# Patient Record
Sex: Male | Born: 1940 | Race: White | Hispanic: No | Marital: Married | Smoking: Former smoker
Health system: Southern US, Community
[De-identification: ages and names within clinical notes are randomized; demographics above are authoritative.]

## PROBLEM LIST (undated history)

## (undated) DIAGNOSIS — C499 Malignant neoplasm of connective and soft tissue, unspecified: Secondary | ICD-10-CM

## (undated) DIAGNOSIS — C801 Malignant (primary) neoplasm, unspecified: Secondary | ICD-10-CM

## (undated) DIAGNOSIS — C3490 Malignant neoplasm of unspecified part of unspecified bronchus or lung: Secondary | ICD-10-CM

## (undated) DIAGNOSIS — Z87442 Personal history of urinary calculi: Secondary | ICD-10-CM

## (undated) DIAGNOSIS — I219 Acute myocardial infarction, unspecified: Secondary | ICD-10-CM

## (undated) DIAGNOSIS — I639 Cerebral infarction, unspecified: Secondary | ICD-10-CM

## (undated) DIAGNOSIS — I2510 Atherosclerotic heart disease of native coronary artery without angina pectoris: Secondary | ICD-10-CM

## (undated) DIAGNOSIS — IMO0001 Reserved for inherently not codable concepts without codable children: Secondary | ICD-10-CM

## (undated) HISTORY — DX: Malignant neoplasm of unspecified part of unspecified bronchus or lung: C34.90

## (undated) HISTORY — PX: SKIN CANCER EXCISION: SHX779

## (undated) HISTORY — PX: THORACOTOMY: SUR1349

## (undated) HISTORY — PX: CAROTID ENDARTERECTOMY: SUR193

---

## 1998-12-23 DIAGNOSIS — I219 Acute myocardial infarction, unspecified: Secondary | ICD-10-CM

## 1998-12-23 HISTORY — DX: Acute myocardial infarction, unspecified: I21.9

## 2003-12-24 DIAGNOSIS — I639 Cerebral infarction, unspecified: Secondary | ICD-10-CM

## 2003-12-24 HISTORY — DX: Cerebral infarction, unspecified: I63.9

## 2004-01-23 ENCOUNTER — Other Ambulatory Visit: Payer: Self-pay

## 2004-01-24 ENCOUNTER — Other Ambulatory Visit: Payer: Self-pay

## 2004-01-25 ENCOUNTER — Other Ambulatory Visit: Payer: Self-pay

## 2004-10-30 ENCOUNTER — Other Ambulatory Visit: Payer: Self-pay

## 2004-10-30 ENCOUNTER — Inpatient Hospital Stay: Payer: Self-pay | Admitting: Surgery

## 2004-11-05 ENCOUNTER — Encounter: Payer: Self-pay | Admitting: Internal Medicine

## 2005-01-31 ENCOUNTER — Emergency Department: Payer: Self-pay | Admitting: Emergency Medicine

## 2005-10-14 ENCOUNTER — Other Ambulatory Visit: Payer: Self-pay

## 2005-10-15 ENCOUNTER — Observation Stay: Payer: Self-pay | Admitting: Internal Medicine

## 2005-11-20 ENCOUNTER — Ambulatory Visit: Payer: Self-pay | Admitting: Surgery

## 2005-11-29 ENCOUNTER — Ambulatory Visit: Payer: Self-pay | Admitting: Surgery

## 2005-12-23 DIAGNOSIS — C801 Malignant (primary) neoplasm, unspecified: Secondary | ICD-10-CM

## 2005-12-23 HISTORY — DX: Malignant (primary) neoplasm, unspecified: C80.1

## 2006-04-09 ENCOUNTER — Other Ambulatory Visit: Payer: Self-pay

## 2006-04-16 ENCOUNTER — Inpatient Hospital Stay: Payer: Self-pay | Admitting: Surgery

## 2007-05-07 ENCOUNTER — Ambulatory Visit: Payer: Self-pay | Admitting: Surgery

## 2007-05-13 ENCOUNTER — Ambulatory Visit: Payer: Self-pay | Admitting: Surgery

## 2007-05-27 ENCOUNTER — Ambulatory Visit: Payer: Self-pay | Admitting: Surgery

## 2007-06-05 ENCOUNTER — Ambulatory Visit: Payer: Self-pay | Admitting: Surgery

## 2007-06-16 ENCOUNTER — Ambulatory Visit: Payer: Self-pay | Admitting: Oncology

## 2007-06-23 ENCOUNTER — Ambulatory Visit: Payer: Self-pay | Admitting: Oncology

## 2007-07-01 ENCOUNTER — Ambulatory Visit: Payer: Self-pay | Admitting: Surgery

## 2007-07-08 ENCOUNTER — Inpatient Hospital Stay: Payer: Self-pay | Admitting: Surgery

## 2007-07-21 ENCOUNTER — Ambulatory Visit: Payer: Self-pay | Admitting: Surgery

## 2007-07-24 ENCOUNTER — Ambulatory Visit: Payer: Self-pay | Admitting: Oncology

## 2007-08-19 ENCOUNTER — Ambulatory Visit: Payer: Self-pay | Admitting: Oncology

## 2007-08-24 ENCOUNTER — Ambulatory Visit: Payer: Self-pay | Admitting: Oncology

## 2007-11-23 ENCOUNTER — Ambulatory Visit: Payer: Self-pay | Admitting: Oncology

## 2007-12-09 ENCOUNTER — Ambulatory Visit: Payer: Self-pay | Admitting: Oncology

## 2007-12-24 ENCOUNTER — Ambulatory Visit: Payer: Self-pay | Admitting: Oncology

## 2008-06-15 ENCOUNTER — Ambulatory Visit: Payer: Self-pay | Admitting: Oncology

## 2008-06-17 ENCOUNTER — Ambulatory Visit: Payer: Self-pay | Admitting: Oncology

## 2008-06-22 ENCOUNTER — Ambulatory Visit: Payer: Self-pay | Admitting: Oncology

## 2008-07-01 ENCOUNTER — Ambulatory Visit: Payer: Self-pay | Admitting: Vascular Surgery

## 2008-07-20 ENCOUNTER — Inpatient Hospital Stay: Payer: Self-pay | Admitting: Vascular Surgery

## 2008-08-23 ENCOUNTER — Ambulatory Visit: Payer: Self-pay | Admitting: Oncology

## 2008-11-22 ENCOUNTER — Ambulatory Visit: Payer: Self-pay | Admitting: Oncology

## 2008-12-14 ENCOUNTER — Ambulatory Visit: Payer: Self-pay | Admitting: Oncology

## 2008-12-23 ENCOUNTER — Ambulatory Visit: Payer: Self-pay | Admitting: Oncology

## 2008-12-23 HISTORY — PX: EYE SURGERY: SHX253

## 2009-06-20 ENCOUNTER — Ambulatory Visit: Payer: Self-pay | Admitting: Oncology

## 2009-06-22 ENCOUNTER — Ambulatory Visit: Payer: Self-pay | Admitting: Oncology

## 2009-07-23 ENCOUNTER — Ambulatory Visit: Payer: Self-pay | Admitting: Oncology

## 2009-12-23 ENCOUNTER — Ambulatory Visit: Payer: Self-pay | Admitting: Oncology

## 2010-01-19 ENCOUNTER — Ambulatory Visit: Payer: Self-pay | Admitting: Oncology

## 2010-01-23 ENCOUNTER — Ambulatory Visit: Payer: Self-pay | Admitting: Oncology

## 2010-02-21 ENCOUNTER — Ambulatory Visit: Payer: Self-pay | Admitting: Ophthalmology

## 2010-06-22 ENCOUNTER — Ambulatory Visit: Payer: Self-pay | Admitting: Oncology

## 2010-07-16 ENCOUNTER — Ambulatory Visit: Payer: Self-pay | Admitting: Oncology

## 2010-07-19 ENCOUNTER — Ambulatory Visit: Payer: Self-pay | Admitting: Oncology

## 2010-07-23 ENCOUNTER — Ambulatory Visit: Payer: Self-pay | Admitting: Oncology

## 2010-12-03 ENCOUNTER — Ambulatory Visit: Payer: Self-pay | Admitting: Internal Medicine

## 2011-01-03 ENCOUNTER — Ambulatory Visit: Payer: Self-pay | Admitting: Vascular Surgery

## 2011-01-04 ENCOUNTER — Ambulatory Visit: Payer: Self-pay | Admitting: Vascular Surgery

## 2011-01-16 ENCOUNTER — Ambulatory Visit: Payer: Self-pay | Admitting: Oncology

## 2011-03-01 ENCOUNTER — Ambulatory Visit: Payer: Self-pay | Admitting: Oncology

## 2011-03-24 ENCOUNTER — Ambulatory Visit: Payer: Self-pay | Admitting: Oncology

## 2011-08-02 ENCOUNTER — Ambulatory Visit: Payer: Self-pay | Admitting: Oncology

## 2011-08-24 ENCOUNTER — Ambulatory Visit: Payer: Self-pay | Admitting: Oncology

## 2011-11-11 ENCOUNTER — Ambulatory Visit: Payer: Self-pay | Admitting: Oncology

## 2011-11-23 ENCOUNTER — Ambulatory Visit: Payer: Self-pay | Admitting: Oncology

## 2011-12-24 DIAGNOSIS — C499 Malignant neoplasm of connective and soft tissue, unspecified: Secondary | ICD-10-CM

## 2011-12-24 HISTORY — PX: COLONOSCOPY: SHX174

## 2011-12-24 HISTORY — DX: Malignant neoplasm of connective and soft tissue, unspecified: C49.9

## 2012-01-30 ENCOUNTER — Ambulatory Visit: Payer: Self-pay | Admitting: Oncology

## 2012-01-31 ENCOUNTER — Ambulatory Visit: Payer: Self-pay | Admitting: Oncology

## 2012-01-31 LAB — CREATININE, SERUM
Creatinine: 0.93 mg/dL (ref 0.60–1.30)
EGFR (African American): >60
EGFR (Non-African Amer.): >60

## 2012-02-03 ENCOUNTER — Ambulatory Visit: Payer: Self-pay | Admitting: Oncology

## 2012-02-03 LAB — COMPREHENSIVE METABOLIC PANEL
Albumin: 3.8 g/dL (ref 3.4–5.0)
Alkaline Phosphatase: 94 U/L (ref 50–136)
BUN: 17 mg/dL (ref 7–18)
Bilirubin,Total: 0.5 mg/dL (ref 0.2–1.0)
Creatinine: 1.02 mg/dL (ref 0.60–1.30)
EGFR (Non-African Amer.): >60
Osmolality: 280 (ref 275–301)
Potassium: 4.5 mmol/L (ref 3.5–5.1)
SGOT(AST): 22 U/L (ref 15–37)
SGPT (ALT): 26 U/L
Total Protein: 7.5 g/dL (ref 6.4–8.2)

## 2012-02-03 LAB — CBC CANCER CENTER
Basophil %: 0.9 %
Eosinophil #: 0.1 x10 3/mm (ref 0.0–0.7)
MCH: 29.8 pg (ref 26.0–34.0)
Monocyte #: 0.3 x10 3/mm (ref 0.0–0.7)
Monocyte %: 7.2 %
Neutrophil #: 2.6 x10 3/mm (ref 1.4–6.5)
Neutrophil %: 58.7 %
RBC: 5.26 10*6/uL (ref 4.40–5.90)
WBC: 4.4 x10 3/mm (ref 3.8–10.6)

## 2012-02-21 ENCOUNTER — Ambulatory Visit: Payer: Self-pay | Admitting: Oncology

## 2012-05-04 ENCOUNTER — Ambulatory Visit: Payer: Self-pay | Admitting: Oncology

## 2012-05-04 LAB — COMPREHENSIVE METABOLIC PANEL
Albumin: 3.7 g/dL (ref 3.4–5.0)
Alkaline Phosphatase: 85 U/L (ref 50–136)
Anion Gap: 9 (ref 7–16)
Bilirubin,Total: 0.5 mg/dL (ref 0.2–1.0)
Calcium, Total: 9 mg/dL (ref 8.5–10.1)
Chloride: 101 mmol/L (ref 98–107)
EGFR (African American): >60
EGFR (Non-African Amer.): >60
Glucose: 101 mg/dL — ABNORMAL HIGH (ref 65–99)
Osmolality: 279 (ref 275–301)
SGOT(AST): 23 U/L (ref 15–37)
Sodium: 138 mmol/L (ref 136–145)

## 2012-05-04 LAB — CBC CANCER CENTER
Basophil #: 0 x10 3/mm (ref 0.0–0.1)
Basophil %: 1 %
Eosinophil #: 0.1 x10 3/mm (ref 0.0–0.7)
Eosinophil %: 2.3 %
Lymphocyte #: 1.4 x10 3/mm (ref 1.0–3.6)
MCH: 29 pg (ref 26.0–34.0)
MCHC: 32.5 g/dL (ref 32.0–36.0)
MCV: 89 fL (ref 80–100)
Monocyte #: 0.4 x10 3/mm (ref 0.2–1.0)
Neutrophil #: 3 x10 3/mm (ref 1.4–6.5)
Neutrophil %: 60.6 %
Platelet: 203 x10 3/mm (ref 150–440)
RDW: 13.3 % (ref 11.5–14.5)
WBC: 4.9 x10 3/mm (ref 3.8–10.6)

## 2012-05-23 ENCOUNTER — Ambulatory Visit: Payer: Self-pay | Admitting: Oncology

## 2012-07-29 ENCOUNTER — Ambulatory Visit: Payer: Self-pay | Admitting: Vascular Surgery

## 2012-07-29 LAB — CREATININE, SERUM
Creatinine: 0.85 mg/dL (ref 0.60–1.30)
EGFR (Non-African Amer.): >60

## 2012-09-02 ENCOUNTER — Ambulatory Visit: Payer: Self-pay | Admitting: Vascular Surgery

## 2012-09-02 LAB — BASIC METABOLIC PANEL
BUN: 14 mg/dL (ref 7–18)
Creatinine: 0.63 mg/dL (ref 0.60–1.30)
EGFR (African American): >60
EGFR (Non-African Amer.): >60
Glucose: 98 mg/dL (ref 65–99)
Osmolality: 276 (ref 275–301)
Potassium: 4 mmol/L (ref 3.5–5.1)

## 2012-09-02 LAB — CBC
HCT: 46.5 % (ref 40.0–52.0)
MCH: 29.7 pg (ref 26.0–34.0)
MCHC: 33.9 g/dL (ref 32.0–36.0)
MCV: 88 fL (ref 80–100)
Platelet: 205 10*3/uL (ref 150–440)
RDW: 13.7 % (ref 11.5–14.5)
WBC: 5.3 10*3/uL (ref 3.8–10.6)

## 2012-09-09 ENCOUNTER — Observation Stay: Payer: Self-pay | Admitting: Vascular Surgery

## 2012-10-22 ENCOUNTER — Ambulatory Visit: Payer: Self-pay | Admitting: Vascular Surgery

## 2012-10-22 LAB — BASIC METABOLIC PANEL
Anion Gap: 10 (ref 7–16)
BUN: 15 mg/dL (ref 7–18)
Calcium, Total: 9.1 mg/dL (ref 8.5–10.1)
EGFR (African American): >60
EGFR (Non-African Amer.): >60
Glucose: 107 mg/dL — ABNORMAL HIGH (ref 65–99)
Osmolality: 275 (ref 275–301)
Potassium: 4.1 mmol/L (ref 3.5–5.1)
Sodium: 137 mmol/L (ref 136–145)

## 2012-10-22 LAB — CBC
HCT: 46.8 % (ref 40.0–52.0)
HGB: 15.9 g/dL (ref 13.0–18.0)
Platelet: 209 10*3/uL (ref 150–440)
RDW: 13.4 % (ref 11.5–14.5)
WBC: 4.8 10*3/uL (ref 3.8–10.6)

## 2012-10-28 ENCOUNTER — Inpatient Hospital Stay: Payer: Self-pay | Admitting: Vascular Surgery

## 2012-10-28 HISTORY — PX: ABDOMINAL AORTIC ANEURYSM REPAIR: SHX42

## 2012-10-29 LAB — CBC WITH DIFFERENTIAL/PLATELET
Basophil #: 0 10*3/uL (ref 0.0–0.1)
Eosinophil #: 0 10*3/uL (ref 0.0–0.7)
HCT: 42.2 % (ref 40.0–52.0)
Lymphocyte %: 11.2 %
MCHC: 34.3 g/dL (ref 32.0–36.0)
MCV: 89 fL (ref 80–100)
Monocyte %: 7.8 %
Neutrophil #: 6.5 10*3/uL (ref 1.4–6.5)
Neutrophil %: 80.8 %
Platelet: 190 10*3/uL (ref 150–440)
RDW: 13 % (ref 11.5–14.5)

## 2012-10-29 LAB — COMPREHENSIVE METABOLIC PANEL WITH GFR
Albumin: 3.2 g/dL — ABNORMAL LOW
Alkaline Phosphatase: 69 U/L
Anion Gap: 11
BUN: 12 mg/dL
Bilirubin,Total: 0.5 mg/dL
Calcium, Total: 8.7 mg/dL
Chloride: 106 mmol/L
Co2: 24 mmol/L
Creatinine: 0.8 mg/dL
EGFR (African American): >60
EGFR (Non-African Amer.): >60
Glucose: 125 mg/dL — ABNORMAL HIGH
Osmolality: 282
Potassium: 4.4 mmol/L
SGOT(AST): 23 U/L
SGPT (ALT): 19 U/L
Sodium: 141 mmol/L
Total Protein: 6.5 g/dL

## 2012-10-29 LAB — MAGNESIUM: Magnesium: 1.9 mg/dL

## 2012-10-29 LAB — PROTIME-INR
INR: 0.9
Prothrombin Time: 12.8 s

## 2012-10-29 LAB — PHOSPHORUS: Phosphorus: 3 mg/dL

## 2012-11-04 ENCOUNTER — Ambulatory Visit: Payer: Self-pay | Admitting: Oncology

## 2012-11-04 LAB — COMPREHENSIVE METABOLIC PANEL
Albumin: 3.1 g/dL — ABNORMAL LOW (ref 3.4–5.0)
Alkaline Phosphatase: 85 U/L (ref 50–136)
Anion Gap: 12 (ref 7–16)
Bilirubin,Total: 0.6 mg/dL (ref 0.2–1.0)
Calcium, Total: 9.3 mg/dL (ref 8.5–10.1)
Creatinine: 0.93 mg/dL (ref 0.60–1.30)
EGFR (African American): >60
EGFR (Non-African Amer.): >60
Glucose: 106 mg/dL — ABNORMAL HIGH (ref 65–99)
Osmolality: 284 (ref 275–301)
Potassium: 4.7 mmol/L (ref 3.5–5.1)
SGPT (ALT): 25 U/L (ref 12–78)
Sodium: 141 mmol/L (ref 136–145)
Total Protein: 7.4 g/dL (ref 6.4–8.2)

## 2012-11-04 LAB — CBC CANCER CENTER
Eosinophil %: 4.1 %
Lymphocyte #: 1.3 x10 3/mm (ref 1.0–3.6)
Lymphocyte %: 24.8 %
MCV: 91 fL (ref 80–100)
Monocyte %: 9.8 %
Neutrophil %: 60.6 %
Platelet: 222 x10 3/mm (ref 150–440)
RBC: 4.85 10*6/uL (ref 4.40–5.90)
WBC: 5.3 x10 3/mm (ref 3.8–10.6)

## 2012-11-22 ENCOUNTER — Ambulatory Visit: Payer: Self-pay | Admitting: Oncology

## 2013-01-23 ENCOUNTER — Ambulatory Visit: Payer: Self-pay | Admitting: Oncology

## 2013-01-29 ENCOUNTER — Ambulatory Visit: Payer: Self-pay | Admitting: Oncology

## 2013-02-01 ENCOUNTER — Ambulatory Visit: Payer: Self-pay | Admitting: Oncology

## 2013-02-01 LAB — CREATININE, SERUM
Creatinine: 0.91 mg/dL (ref 0.60–1.30)
EGFR (African American): >60
EGFR (Non-African Amer.): >60

## 2013-02-17 ENCOUNTER — Ambulatory Visit: Payer: Self-pay | Admitting: Family Medicine

## 2013-02-20 ENCOUNTER — Ambulatory Visit: Payer: Self-pay | Admitting: Oncology

## 2013-03-06 ENCOUNTER — Emergency Department: Payer: Self-pay | Admitting: Unknown Physician Specialty

## 2013-03-11 ENCOUNTER — Other Ambulatory Visit: Payer: Self-pay | Admitting: Vascular Surgery

## 2013-03-11 LAB — LIPID PANEL
Cholesterol: 228 mg/dL — ABNORMAL HIGH (ref 0–200)
HDL Cholesterol: 42 mg/dL (ref 40–60)
VLDL Cholesterol, Calc: 31 mg/dL (ref 5–40)

## 2013-03-11 LAB — CREATININE, SERUM
Creatinine: 0.84 mg/dL (ref 0.60–1.30)
EGFR (African American): >60

## 2013-03-11 LAB — HEPATIC FUNCTION PANEL A (ARMC)
SGOT(AST): 31 U/L (ref 15–37)
SGPT (ALT): 24 U/L (ref 12–78)

## 2013-03-18 ENCOUNTER — Ambulatory Visit: Payer: Self-pay | Admitting: Oncology

## 2013-03-18 LAB — PROTIME-INR: Prothrombin Time: 13 secs (ref 11.5–14.7)

## 2013-03-18 LAB — APTT: Activated PTT: 31.3 secs (ref 23.6–35.9)

## 2013-03-18 LAB — PLATELET COUNT: Platelet: 206 10*3/uL (ref 150–440)

## 2013-03-22 ENCOUNTER — Emergency Department: Payer: Self-pay | Admitting: Emergency Medicine

## 2013-03-23 ENCOUNTER — Ambulatory Visit: Payer: Self-pay | Admitting: Oncology

## 2013-03-23 LAB — PATHOLOGY REPORT

## 2013-04-01 LAB — COMPREHENSIVE METABOLIC PANEL
Albumin: 3.9 g/dL (ref 3.4–5.0)
Anion Gap: 8 (ref 7–16)
Bilirubin,Total: 0.6 mg/dL (ref 0.2–1.0)
Co2: 29 mmol/L (ref 21–32)
Creatinine: 1.07 mg/dL (ref 0.60–1.30)
EGFR (African American): >60
EGFR (Non-African Amer.): >60
Osmolality: 276 (ref 275–301)
Potassium: 4.4 mmol/L (ref 3.5–5.1)
Sodium: 138 mmol/L (ref 136–145)
Total Protein: 7.9 g/dL (ref 6.4–8.2)

## 2013-04-01 LAB — PROTIME-INR
INR: 0.9
Prothrombin Time: 12.7 secs (ref 11.5–14.7)

## 2013-04-01 LAB — CBC CANCER CENTER
Basophil #: 0.1 x10 3/mm (ref 0.0–0.1)
Basophil %: 1.2 %
Eosinophil #: 0.1 x10 3/mm (ref 0.0–0.7)
Eosinophil %: 2.6 %
HCT: 48.5 % (ref 40.0–52.0)
MCH: 28.9 pg (ref 26.0–34.0)
MCV: 87 fL (ref 80–100)
Monocyte #: 0.4 x10 3/mm (ref 0.2–1.0)
Neutrophil #: 2.9 x10 3/mm (ref 1.4–6.5)
Platelet: 192 x10 3/mm (ref 150–440)
RBC: 5.58 10*6/uL (ref 4.40–5.90)

## 2013-04-01 LAB — APTT: Activated PTT: 30.4 secs (ref 23.6–35.9)

## 2013-04-05 ENCOUNTER — Ambulatory Visit: Payer: Self-pay

## 2013-04-12 ENCOUNTER — Inpatient Hospital Stay: Payer: Self-pay | Admitting: Cardiothoracic Surgery

## 2013-04-13 LAB — CBC WITH DIFFERENTIAL/PLATELET
Basophil #: 0 10*3/uL (ref 0.0–0.1)
Eosinophil #: 0 10*3/uL (ref 0.0–0.7)
Eosinophil %: 0.4 %
HCT: 44.5 % (ref 40.0–52.0)
Lymphocyte #: 0.8 10*3/uL — ABNORMAL LOW (ref 1.0–3.6)
MCH: 29.3 pg (ref 26.0–34.0)
MCV: 89 fL (ref 80–100)
Monocyte #: 0.7 x10 3/mm (ref 0.2–1.0)
Neutrophil #: 6.5 10*3/uL (ref 1.4–6.5)
Platelet: 177 10*3/uL (ref 150–440)
RBC: 5.03 10*6/uL (ref 4.40–5.90)
RDW: 13.5 % (ref 11.5–14.5)
WBC: 8 10*3/uL (ref 3.8–10.6)

## 2013-04-13 LAB — BASIC METABOLIC PANEL
Anion Gap: 7 (ref 7–16)
BUN: 15 mg/dL (ref 7–18)
Calcium, Total: 8.2 mg/dL — ABNORMAL LOW (ref 8.5–10.1)
Chloride: 104 mmol/L (ref 98–107)
EGFR (African American): >60
Glucose: 167 mg/dL — ABNORMAL HIGH (ref 65–99)

## 2013-04-14 LAB — PATHOLOGY REPORT

## 2013-04-17 LAB — PLATELET COUNT: Platelet: 203 10*3/uL (ref 150–440)

## 2013-04-17 LAB — HEMOGLOBIN: HGB: 14 g/dL (ref 13.0–18.0)

## 2013-04-22 ENCOUNTER — Ambulatory Visit: Payer: Self-pay | Admitting: Oncology

## 2013-05-06 ENCOUNTER — Ambulatory Visit: Payer: Self-pay | Admitting: Cardiothoracic Surgery

## 2013-05-23 ENCOUNTER — Ambulatory Visit: Payer: Self-pay | Admitting: Oncology

## 2013-07-23 ENCOUNTER — Ambulatory Visit: Payer: Self-pay | Admitting: Oncology

## 2013-07-29 ENCOUNTER — Ambulatory Visit: Payer: Self-pay | Admitting: Oncology

## 2013-08-23 ENCOUNTER — Ambulatory Visit: Payer: Self-pay | Admitting: Oncology

## 2013-12-01 ENCOUNTER — Ambulatory Visit: Payer: Self-pay | Admitting: Oncology

## 2013-12-06 ENCOUNTER — Ambulatory Visit: Payer: Self-pay | Admitting: Oncology

## 2013-12-06 LAB — COMPREHENSIVE METABOLIC PANEL
Albumin: 3.7 g/dL (ref 3.4–5.0)
Alkaline Phosphatase: 87 U/L
Anion Gap: 8 (ref 7–16)
BUN: 17 mg/dL (ref 7–18)
Calcium, Total: 9.1 mg/dL (ref 8.5–10.1)
Chloride: 102 mmol/L (ref 98–107)
Co2: 29 mmol/L (ref 21–32)
EGFR (African American): >60
Glucose: 97 mg/dL (ref 65–99)
Potassium: 4.6 mmol/L (ref 3.5–5.1)
SGOT(AST): 24 U/L (ref 15–37)
SGPT (ALT): 22 U/L (ref 12–78)
Sodium: 139 mmol/L (ref 136–145)

## 2013-12-06 LAB — CBC CANCER CENTER
Basophil #: 0.1 x10 3/mm (ref 0.0–0.1)
Basophil %: 1.1 %
Eosinophil #: 0.1 x10 3/mm (ref 0.0–0.7)
Eosinophil %: 2.1 %
Lymphocyte %: 31.3 %
MCH: 29.5 pg (ref 26.0–34.0)
MCHC: 33.1 g/dL (ref 32.0–36.0)
Monocyte #: 0.4 x10 3/mm (ref 0.2–1.0)
Monocyte %: 8.1 %
Neutrophil %: 57.4 %
Platelet: 212 x10 3/mm (ref 150–440)
RBC: 5.37 10*6/uL (ref 4.40–5.90)

## 2013-12-07 ENCOUNTER — Ambulatory Visit: Payer: Self-pay | Admitting: Oncology

## 2013-12-14 ENCOUNTER — Ambulatory Visit: Payer: Self-pay | Admitting: Oncology

## 2013-12-15 LAB — PATHOLOGY REPORT

## 2013-12-23 ENCOUNTER — Ambulatory Visit: Payer: Self-pay | Admitting: Oncology

## 2013-12-23 ENCOUNTER — Ambulatory Visit: Payer: Self-pay | Admitting: Cardiothoracic Surgery

## 2014-01-06 LAB — CBC CANCER CENTER
BASOS ABS: 0.1 x10 3/mm (ref 0.0–0.1)
BASOS PCT: 1.1 %
EOS PCT: 2.8 %
Eosinophil #: 0.2 x10 3/mm (ref 0.0–0.7)
HCT: 47.4 % (ref 40.0–52.0)
HGB: 15.6 g/dL (ref 13.0–18.0)
LYMPHS ABS: 1.5 x10 3/mm (ref 1.0–3.6)
LYMPHS PCT: 27.3 %
MCH: 29.1 pg (ref 26.0–34.0)
MCHC: 32.9 g/dL (ref 32.0–36.0)
MCV: 89 fL (ref 80–100)
Monocyte #: 0.4 x10 3/mm (ref 0.2–1.0)
Monocyte %: 7 %
NEUTROS ABS: 3.5 x10 3/mm (ref 1.4–6.5)
NEUTROS PCT: 61.8 %
PLATELETS: 203 x10 3/mm (ref 150–440)
RBC: 5.36 10*6/uL (ref 4.40–5.90)
RDW: 13.2 % (ref 11.5–14.5)
WBC: 5.6 x10 3/mm (ref 3.8–10.6)

## 2014-01-06 LAB — COMPREHENSIVE METABOLIC PANEL
AST: 21 U/L (ref 15–37)
Albumin: 3.6 g/dL (ref 3.4–5.0)
Alkaline Phosphatase: 80 U/L
Anion Gap: 6 — ABNORMAL LOW (ref 7–16)
BUN: 20 mg/dL — ABNORMAL HIGH (ref 7–18)
Bilirubin,Total: 0.4 mg/dL (ref 0.2–1.0)
CO2: 31 mmol/L (ref 21–32)
Calcium, Total: 8.8 mg/dL (ref 8.5–10.1)
Chloride: 101 mmol/L (ref 98–107)
Creatinine: 0.9 mg/dL (ref 0.60–1.30)
GLUCOSE: 99 mg/dL (ref 65–99)
Osmolality: 278 (ref 275–301)
POTASSIUM: 4.9 mmol/L (ref 3.5–5.1)
SGPT (ALT): 20 U/L (ref 12–78)
Sodium: 138 mmol/L (ref 136–145)
Total Protein: 7.5 g/dL (ref 6.4–8.2)

## 2014-01-06 LAB — PROTIME-INR
INR: 0.9
Prothrombin Time: 12.2 s

## 2014-01-06 LAB — APTT: Activated PTT: 27.1 s

## 2014-01-12 ENCOUNTER — Ambulatory Visit: Payer: Self-pay

## 2014-01-19 ENCOUNTER — Inpatient Hospital Stay: Payer: Self-pay

## 2014-01-20 LAB — BASIC METABOLIC PANEL
Anion Gap: 4 — ABNORMAL LOW (ref 7–16)
BUN: 26 mg/dL — ABNORMAL HIGH (ref 7–18)
CALCIUM: 8.3 mg/dL — AB (ref 8.5–10.1)
CHLORIDE: 103 mmol/L (ref 98–107)
CREATININE: 1.47 mg/dL — AB (ref 0.60–1.30)
Co2: 27 mmol/L (ref 21–32)
GFR CALC AF AMER: 54 — AB
GFR CALC NON AF AMER: 47 — AB
GLUCOSE: 119 mg/dL — AB (ref 65–99)
OSMOLALITY: 274 (ref 275–301)
POTASSIUM: 4.4 mmol/L (ref 3.5–5.1)
SODIUM: 134 mmol/L — AB (ref 136–145)

## 2014-01-20 LAB — CBC WITH DIFFERENTIAL/PLATELET
BASOS PCT: 0.7 %
Basophil #: 0 10*3/uL (ref 0.0–0.1)
EOS ABS: 0.1 10*3/uL (ref 0.0–0.7)
Eosinophil %: 1.8 %
HCT: 40.1 % (ref 40.0–52.0)
HGB: 13.5 g/dL (ref 13.0–18.0)
LYMPHS ABS: 1 10*3/uL (ref 1.0–3.6)
Lymphocyte %: 14.3 %
MCH: 30.1 pg (ref 26.0–34.0)
MCHC: 33.7 g/dL (ref 32.0–36.0)
MCV: 89 fL (ref 80–100)
MONO ABS: 0.8 x10 3/mm (ref 0.2–1.0)
MONOS PCT: 11.1 %
NEUTROS ABS: 5 10*3/uL (ref 1.4–6.5)
NEUTROS PCT: 72.1 %
Platelet: 156 10*3/uL (ref 150–440)
RBC: 4.49 10*6/uL (ref 4.40–5.90)
RDW: 13.1 % (ref 11.5–14.5)
WBC: 6.9 10*3/uL (ref 3.8–10.6)

## 2014-01-20 LAB — PATHOLOGY REPORT

## 2014-01-21 LAB — BASIC METABOLIC PANEL
Anion Gap: 4 — ABNORMAL LOW (ref 7–16)
BUN: 18 mg/dL (ref 7–18)
Calcium, Total: 8.7 mg/dL (ref 8.5–10.1)
Chloride: 101 mmol/L (ref 98–107)
Co2: 27 mmol/L (ref 21–32)
Creatinine: 1.1 mg/dL (ref 0.60–1.30)
GLUCOSE: 146 mg/dL — AB (ref 65–99)
Osmolality: 269 (ref 275–301)
Potassium: 4.4 mmol/L (ref 3.5–5.1)
SODIUM: 132 mmol/L — AB (ref 136–145)

## 2014-01-21 LAB — CBC WITH DIFFERENTIAL/PLATELET
BASOS ABS: 0 10*3/uL (ref 0.0–0.1)
Basophil %: 0.5 %
EOS ABS: 0.1 10*3/uL (ref 0.0–0.7)
EOS PCT: 1 %
HCT: 41.8 % (ref 40.0–52.0)
HGB: 14.4 g/dL (ref 13.0–18.0)
LYMPHS ABS: 0.9 10*3/uL — AB (ref 1.0–3.6)
Lymphocyte %: 9.9 %
MCH: 30.5 pg (ref 26.0–34.0)
MCHC: 34.4 g/dL (ref 32.0–36.0)
MCV: 89 fL (ref 80–100)
Monocyte #: 1 x10 3/mm (ref 0.2–1.0)
Monocyte %: 10.9 %
NEUTROS PCT: 77.7 %
Neutrophil #: 6.9 10*3/uL — ABNORMAL HIGH (ref 1.4–6.5)
Platelet: 162 10*3/uL (ref 150–440)
RBC: 4.72 10*6/uL (ref 4.40–5.90)
RDW: 13.1 % (ref 11.5–14.5)
WBC: 8.9 10*3/uL (ref 3.8–10.6)

## 2014-01-23 ENCOUNTER — Ambulatory Visit: Payer: Self-pay

## 2014-01-23 ENCOUNTER — Ambulatory Visit: Payer: Self-pay | Admitting: Oncology

## 2014-01-25 LAB — PLATELET COUNT: Platelet: 259 10*3/uL (ref 150–440)

## 2014-02-20 ENCOUNTER — Ambulatory Visit: Payer: Self-pay | Admitting: Oncology

## 2014-03-23 ENCOUNTER — Ambulatory Visit: Payer: Self-pay | Admitting: Cardiothoracic Surgery

## 2014-03-23 ENCOUNTER — Ambulatory Visit: Payer: Self-pay | Admitting: Oncology

## 2014-06-28 ENCOUNTER — Ambulatory Visit: Payer: Self-pay | Admitting: Oncology

## 2014-06-29 ENCOUNTER — Ambulatory Visit: Payer: Self-pay | Admitting: Oncology

## 2014-06-29 LAB — COMPREHENSIVE METABOLIC PANEL
ALK PHOS: 78 U/L
ANION GAP: 8 (ref 7–16)
AST: 19 U/L (ref 15–37)
Albumin: 3.7 g/dL (ref 3.4–5.0)
BILIRUBIN TOTAL: 0.6 mg/dL (ref 0.2–1.0)
BUN: 17 mg/dL (ref 7–18)
CREATININE: 0.9 mg/dL (ref 0.60–1.30)
Calcium, Total: 9.3 mg/dL (ref 8.5–10.1)
Chloride: 104 mmol/L (ref 98–107)
Co2: 28 mmol/L (ref 21–32)
EGFR (African American): >60
EGFR (Non-African Amer.): >60
Glucose: 112 mg/dL — ABNORMAL HIGH (ref 65–99)
OSMOLALITY: 282 (ref 275–301)
POTASSIUM: 4.6 mmol/L (ref 3.5–5.1)
SGPT (ALT): 24 U/L (ref 12–78)
Sodium: 140 mmol/L (ref 136–145)
Total Protein: 7.4 g/dL (ref 6.4–8.2)

## 2014-06-29 LAB — CBC CANCER CENTER
BASOS ABS: 0.1 x10 3/mm (ref 0.0–0.1)
Basophil %: 1.7 %
Eosinophil #: 0.2 x10 3/mm (ref 0.0–0.7)
Eosinophil %: 5 %
HCT: 46.9 % (ref 40.0–52.0)
HGB: 15.5 g/dL (ref 13.0–18.0)
Lymphocyte #: 1.5 x10 3/mm (ref 1.0–3.6)
Lymphocyte %: 35.5 %
MCH: 29.4 pg (ref 26.0–34.0)
MCHC: 33.1 g/dL (ref 32.0–36.0)
MCV: 89 fL (ref 80–100)
MONO ABS: 0.3 x10 3/mm (ref 0.2–1.0)
Monocyte %: 8.2 %
NEUTROS ABS: 2 x10 3/mm (ref 1.4–6.5)
NEUTROS PCT: 49.6 %
PLATELETS: 161 x10 3/mm (ref 150–440)
RBC: 5.28 10*6/uL (ref 4.40–5.90)
RDW: 13.6 % (ref 11.5–14.5)
WBC: 4.1 x10 3/mm (ref 3.8–10.6)

## 2014-07-23 ENCOUNTER — Ambulatory Visit: Payer: Self-pay | Admitting: Oncology

## 2015-01-03 ENCOUNTER — Ambulatory Visit: Payer: Self-pay | Admitting: Oncology

## 2015-01-03 LAB — COMPREHENSIVE METABOLIC PANEL
ANION GAP: 8 (ref 7–16)
AST: 24 U/L (ref 15–37)
Albumin: 4 g/dL (ref 3.4–5.0)
Alkaline Phosphatase: 73 U/L
BUN: 14 mg/dL (ref 7–18)
Bilirubin,Total: 0.6 mg/dL (ref 0.2–1.0)
CHLORIDE: 100 mmol/L (ref 98–107)
Calcium, Total: 9.2 mg/dL (ref 8.5–10.1)
Co2: 30 mmol/L (ref 21–32)
Creatinine: 1.05 mg/dL (ref 0.60–1.30)
GLUCOSE: 117 mg/dL — AB (ref 65–99)
Osmolality: 277 (ref 275–301)
Potassium: 4.8 mmol/L (ref 3.5–5.1)
SGPT (ALT): 34 U/L
Sodium: 138 mmol/L (ref 136–145)
TOTAL PROTEIN: 7.8 g/dL (ref 6.4–8.2)

## 2015-01-03 LAB — CBC CANCER CENTER
BASOS ABS: 0.1 x10 3/mm (ref 0.0–0.1)
Basophil %: 1.5 %
EOS PCT: 2.6 %
Eosinophil #: 0.1 x10 3/mm (ref 0.0–0.7)
HCT: 52.2 % — AB (ref 40.0–52.0)
HGB: 17.4 g/dL (ref 13.0–18.0)
Lymphocyte #: 1.9 x10 3/mm (ref 1.0–3.6)
Lymphocyte %: 36.3 %
MCH: 29.3 pg (ref 26.0–34.0)
MCHC: 33.3 g/dL (ref 32.0–36.0)
MCV: 88 fL (ref 80–100)
MONOS PCT: 8.5 %
Monocyte #: 0.5 x10 3/mm (ref 0.2–1.0)
Neutrophil #: 2.7 x10 3/mm (ref 1.4–6.5)
Neutrophil %: 51.1 %
Platelet: 221 x10 3/mm (ref 150–440)
RBC: 5.92 10*6/uL — ABNORMAL HIGH (ref 4.40–5.90)
RDW: 13.1 % (ref 11.5–14.5)
WBC: 5.3 x10 3/mm (ref 3.8–10.6)

## 2015-01-23 ENCOUNTER — Ambulatory Visit: Payer: Self-pay | Admitting: Oncology

## 2015-04-07 ENCOUNTER — Other Ambulatory Visit: Payer: Self-pay | Admitting: Oncology

## 2015-04-07 DIAGNOSIS — C569 Malignant neoplasm of unspecified ovary: Secondary | ICD-10-CM

## 2015-04-07 DIAGNOSIS — Z85831 Personal history of malignant neoplasm of soft tissue: Secondary | ICD-10-CM

## 2015-04-11 NOTE — Op Note (Signed)
PATIENT NAME:  Stephen [name], Stephen [name] MR#:  [id number] DATE OF BIRTH:  12-23-1941  DATE OF PROCEDURE:  09/09/2012  PREOPERATIVE DIAGNOSES:  1. Abdominal aortic aneurysm greater than 5 cm.  2. 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  3. Carotid artery occlusive disease.  4. History of soft tissue sarcoma, status post successful resection.   POSTOPERATIVE DIAGNOSES:  1. Abdominal aortic aneurysm greater than 5 cm.  2. 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  3. Carotid artery occlusive disease.  4. History of soft tissue sarcoma, status post successful resection.   PROCEDURES PERFORMED:  1. Left femoral artery cutdown in preparation for placement of an endograft.  2. Catheter placement into aorta left femoral approach.  3. Catheter placement into aorta right femoral approach.  4. Unsuccessful recanalization of the right iliac artery requiring discontinuation of aneurysm repair.   CO-SURGEON: Katha Cabal, MD and Dr. Leotis Pain   ANESTHESIA: General by endotracheal intubation.   FLUIDS: Per anesthesia record.   ESTIMATED BLOOD LOSS: 100 mL.   FLUOROSCOPY TIME: 36 minutes.   CONTRAST USED: Isovue, approximately 80 mL.   INDICATIONS: [name] is a 74 year old gentleman who has been followed in the office for a known abdominal aortic aneurysm. His aneurysm has now increased to a diameter greater than 5 and he has therefore been counseled to undergo repair. 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 Risks and benefits were reviewed, all questions are answered and the patient has agreed to proceed with endovascular repair and attempt at recanalization of the right iliac system.    DESCRIPTION OF PROCEDURE: Working simultaneously with Dr. Lucky Cowboy on the right and myself on the left, vertical incision is made overlying the left femoral impulse and the dissection is carried down to expose the common femoral artery. Common femoral artery is then looped proximally and distally, a small side branch is looped with a blue Silastic vessel loop.   Ultrasound is then placed in a sterile sleeve. Ultrasound is utilized secondary to lack of appropriate landmarks and to avoid vascular injury. Under direct ultrasound visualization, the common femoral artery is identified. It is pulsatile and homogeneous indicating patency. Image is recorded and with real-time visualization a micro Seldinger needle is inserted followed by a J-wire and a 6 [place] sheath. With Dr. Lucky Cowboy working from the right side the Glidewire and catheter are used to attempt to cross the common iliac occlusion. Multiple passes are made. Access is obtained to the left common femoral with a Seldinger needle from the open exposure and using a SOS catheter as well as a rim catheter attempts are made at crossing in an antegrade direction. It is during the attempts at crossing the common iliac portion that the fluoro time was utilized. After approximately 30 minutes of fluoro time the catheters have both found subintimal planes but true recanalization was never achieved. Lumen was never regained on the right and given the amount fluoro time and concern for safety issues it was decided to terminate the procedure at that point.   StarClose device was then deployed on the right and pursestring suture was used to secure the sheath site on the left. The wound in the left groin was then irrigated and closed in  multiple layers using 2-0 Vicryl followed by 3-0 Vicryl, 4-0 Monocryl for skin and Dermabond. The patient tolerated the procedure well and there were no immediate complications except for discontinuation of the presumed procedure. He is taken to  the recovery area in stable condition.  ____________________________ Katha Cabal, MD ggs:cms D: 09/11/2012 10:49:38 ET T: 09/11/2012 12:40:18 ET  JOB#: [id number] cc: Katha Cabal, MD, <Dictator> Katha Cabal MD ELECTRONICALLY SIGNED 09/22/2012 14:05

## 2015-04-11 NOTE — Discharge Summary (Signed)
PATIENT NAME:  Stephen [name], Stephen [name] MR#:  [id number] DATE OF BIRTH:  [birth year]  DATE OF ADMISSION:  10/28/2012 DATE OF DISCHARGE:  10/29/2012  ADMITTING/DISCHARGE DIAGNOSES:  1. Abdominal aortic aneurysm, greater than 5 cm maximal diameter.  2. History of lung cancer.  3. History of sarcoma.  4. Carotid artery disease.   HOSPITAL PROCEDURE PERFORMED:   Aortouniiliac stent graft repair of his abdominal aortic aneurysm. For full details of that, please see the dictated operative summary.   BRIEF HISTORY: The patient is a 74 year old white male with multiple medical and vascular issues who I have followed. He has had carotid surgery and stenting. He has had a remote history of lung cancer resection. He has had soft tissue sarcoma resected. He has a known right iliac artery occlusion, and he has an aneurysm which we have been following. This is now greater than 5 cm in maximal diameter. He is brought in for definitive stent graft repair.   HOSPITAL COURSE: The patient was [facility] to the [facility] and taken to the Operating Room where the above-mentioned procedure was performed. For full details of that procedure, please see the dictated operative summary. He did well. His left groin incision was clean, dry and intact on postoperative day. He had minimal pain, was generally improving well. His laboratory evaluations on postoperative day one were normal. He was deemed stable for discharge and was discharged home accompanied by his family. He was given a prescription for aspirin 81 mg daily, his home medication.   ACTIVITY: His activity is as tolerated with no heavy lifting or strenuous activity for 4 to 5 days.   FOLLOWUP: He will return to the office in 3 to 4 weeks.  ____________________________ Algernon Huxley, MD jsd:cbb D: 11/06/2012 08:31:37 ET T: 11/06/2012 11:18:09 ET JOB#: [id number] Algernon Huxley MD ELECTRONICALLY SIGNED 11/09/2012 9:03

## 2015-04-11 NOTE — Op Note (Signed)
PATIENT NAME:  Stephen [name], Stephen [name] MR#:  [id number] DATE OF BIRTH:  Jan 26, 1941  DATE OF PROCEDURE:  09/09/2012  PREOPERATIVE DIAGNOSES:  1. Abdominal aortic aneurysm, greater than 5 cm in maximal diameter.  2. 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  3. Carotid artery disease status post previous treatment.  4. Soft tissue sarcoma status post resection.  5. History of lung cancer, remote.   POSTOPERATIVE DIAGNOSES:  1. Abdominal aortic aneurysm, greater than 5 cm in maximal diameter.  2. 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  3. Carotid artery disease status post previous treatment.  4. Soft tissue sarcoma status post resection.  5. History of lung cancer, remote.   PROCEDURES PERFORMED: 1. Left femoral artery cutdown for placement of endoprosthesis.  2. Catheter placement into aorta from left femoral approach.  3. Catheter placement into right iliac artery from right femoral approach.  4. Ultrasound guidance for access of right femoral artery.  5. Attempted recanalization of right iliac artery, unable to cross lesion for placement of endoprosthesis.  SURGEON: Leotis Pain, MD  CO-SURGEON: Hortencia Pilar, MD  ANESTHESIA: General.   ESTIMATED BLOOD LOSS: 100 mL.  FLUOROSCOPY TIME: Approximately 36 minutes.   INDICATION FOR PROCEDURE: This is a gentleman who I have been following for an abdominal aortic aneurysm for some time. This has now grown to be greater than 5 cm in maximal diameter. He has a right iliac occlusion. We have discussed that if we are able to cross this occlusion we would be able to treat him with conventional Endologix stent graft due to low crossing profile of the contralateral limb. The risks and benefits were discussed and informed consent was obtained.   DESCRIPTION OF PROCEDURE: The patient was brought to the vascular interventional radiology suite. His abdomen and groin were sterilely prepped and draped and our anesthesia colleagues  provided general anesthetic. Dr. Delana Meyer performed a cutdown of the left femoral artery and I accessed the right femoral artery. Under direct ultrasound guidance, 8 French sheaths were placed bilaterally. I attempted to cross the right iliac occlusion with multiple wires and catheters including a Kumpe catheter, C2 catheter, stiff-angled Glidewire, and an Advantage wire. We also attempted to cross from the left iliac approach with a VS-1 catheter, rim catheter, and a C2 catheter, and Glidewire and Advantage wire as well. Dr. Delana Meyer performed an aortogram from the left femoral approach to help delineate the anatomy and there was an appropriate infrarenal neck for stent graft placement, that confirmed with our previous CT scan. Despite our multiple attempts, it was clear with over one half hour of fluoroscopy that we were not going to be able to cross this occlusion today. At this point, a StarClose closure device was deployed in the right femoral artery. I closed the left femoral arteriotomy with several interrupted Prolene sutures and then closed the groin in a layered fashion. Sterile dressings were placed. The patient tolerated the procedure well and was taken to the recovery room in stable condition.  ____________________________ Stephen Huxley, MD jsd:slb D: 09/09/2012 17:36:31 ET T: 09/10/2012 12:11:42 ET JOB#: [id number]  cc: Stephen Huxley, MD, <Dictator> Stephen Huxley MD ELECTRONICALLY SIGNED 09/17/2012 13:42

## 2015-04-11 NOTE — Op Note (Signed)
PATIENT NAME:  Stephen [name], Stephen [name] MR#:  [id number] DATE OF BIRTH:  20-Jul-1941  DATE OF PROCEDURE:  10/28/2012  PREOPERATIVE DIAGNOSES:  1. Greater than 5 cm abdominal aortic aneurysm.  2. Right iliac artery occlusion.  3. Carotid artery stenosis status post previous revascularization.  4. Lung cancer status post resection many years ago.  5. 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   POSTOPERATIVE DIAGNOSES: 1. Greater than 5 cm abdominal aortic aneurysm.  2. Right iliac artery occlusion.  3. Carotid artery stenosis status post previous revascularization.  4. Lung cancer status post resection many years ago.  5. 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   PROCEDURES:  1. Left femoral artery cutdown for placement of endoprosthesis redo by Dr. Lucky Cowboy.  2. Ultrasound guidance for vascular access, left radial artery, by Dr. Delana Meyer.  3. Catheter placement into aorta from both approaches.  4. Placement of a Medtronic aortouniiliac endoprosthesis primary left.  5. Placement of a left iliac extender.   CO-SURGEONS: Algernon Huxley, MD and Katha Cabal, MD   ANESTHESIA: General.   ESTIMATED BLOOD LOSS: Approximately 50 mL.   INDICATION FOR PROCEDURE: This is a gentleman well known to me with longstanding abdominal aortic aneurysm which has now reached over 5 cm in maximal diameter. This is associated with a right iliac occlusion and was unable to be crossed several weeks ago. He has multiple other ongoing issues. His anatomy was amenable to aortouniiliac repair. Risks and benefits were discussed with the patient and he desired to proceed.   DESCRIPTION OF PROCEDURE: The patient is brought to the Vascular Interventional Radiology Suite. With the help of our anesthesia colleagues providing a general anesthetic, we sterilely prepped and draped the patient. Dr. Delana Meyer accessed the left radial artery with ultrasound guidance  and placed a 5 French sheath. I performed a redo cutdown of the left groin and exposed the femoral artery, encircled this proximally and distally with vessel loops. The patient was systemically heparinized. I placed an 8 [place] sheath initially, exchanged for a stiff wire and then placed the delivery sheath for the Medtronic aortouniiliac device. Dr. Delana Meyer put a pigtail catheter in the aorta and aortogram was performed. We then selected the device, brought it on the field, and I began deploying it. Magnified images were performed with a cranial projection through Dr. Nino Parsley pigtail catheter and we deployed this device that had a suprarenal fixator with the fabric portion just below the renal arteries with the right being slightly low. After completion of the deployment of the main body device, I performed a retrograde arteriogram through the left femoral sheath and we extended down to about 1 cm above the hypogastric artery with an iliac extension limb over the stiff wire. The compliant balloon was used to balloon the endoprosthesis and completion angiogram performed through the pigtail catheter showed the endoprosthesis to be widely patent. There did appear to be a type II endoleak with no type I or III endoleaks seen. At this point I elected to terminate the procedure. Dr. Delana Meyer removed the 5 French sheath in the arm and held pressure. I closed the groin with layers of 2-0 Vicryl x2, a 3-0 Vicryl, and 4-0 Monocryl and a sterile dressing was placed. The femoral arteriotomy was closed with six Prolene sutures. The patient tolerated the procedure well and was taken to the recovery room in stable condition.   ____________________________ Algernon Huxley, MD  jsd:drc D: 10/28/2012 11:06:58 ET T: 10/28/2012 11:38:14 ET JOB#: [id number]  cc: Algernon Huxley, MD, <Dictator> Algernon Huxley MD ELECTRONICALLY SIGNED 10/29/2012 18:12

## 2015-04-11 NOTE — Op Note (Signed)
PATIENT NAME:  Stephen [name], Stephen [name] MR#:  [id number] DATE OF BIRTH:  1941-09-23  DATE OF PROCEDURE:  10/28/2012  PREOPERATIVE DIAGNOSES:  1. Abdominal aortic aneurysm.  2. Atherosclerotic occlusive disease bilateral lower extremities with occlusion of the right common and external iliac arteries.  3. History of lung carcinoma.  4. Atherosclerotic occlusive disease bilateral carotid arteries.   POSTOPERATIVE DIAGNOSES: 1. Abdominal aortic aneurysm.  2. Atherosclerotic occlusive disease bilateral lower extremities with occlusion of the right common and external iliac arteries.  3. History of lung carcinoma.  4. Atherosclerotic occlusive disease bilateral carotid arteries.   PROCEDURES PERFORMED:  1. Introduction catheter into aorta, left radial artery approach.  2. Introduction catheter to aorta, open left femoral approach.  3. Repair of abdominal aortic aneurysm using an Endurant Medtronic endovascular prosthesis.  4. Extension, eft iliac limb with an Endurant endoprosthesis.   PROCEDURES PERFORMED BY: Katha Cabal and Leotis Pain, MD, co-surgeons.   ANESTHESIA: General by endotracheal intubation.   FLUIDS: Per anesthesia record.   ESTIMATED BLOOD LOSS: Minimal.   FLUORO TIME: 11.1 minutes.   CONTRAST USED: Isovue 70 mL.   INDICATIONS: Stephen [name] is a 74 year old gentleman who is returning to the angiography suite for endovascular repair. Previous attempt at intervention was unsuccessful at recanalizing the right iliac system and, therefore, he is undergoing placement of an aortouniiliac device. The risks and benefits were reviewed. All questions answered. The patient has agreed to proceed.   PROCEDURE: The patient is taken to the Special Procedures Suite and placed in the supine position. After adequate general anesthesia is induced and appropriate invasive monitors are placed, he is positioned supine and subsequently prepped from the level of the nipples down to the level of the knees.  His left arm is also prepped and extended palm upward. Working simultaneously, Dr. Lucky Cowboy re-incised the left groin incisional scar and dissected down to the femoral artery which was looped proximally and distally. Utilizing ultrasound which was placed in a sterile sleeve, I identified the left radial artery. It is echolucent and pulsatile indicating patency. Image is recorded for the permanent record. Under direct ultrasound visualization, a micropuncture needle is inserted into the radial artery, microwire followed by micro sheath is then inserted. J-wire followed by a 5 French sheath. A 5 French pigtail catheter and a Glidewire are then negotiated with fluoroscopic guidance into the arch of the aorta and subsequently down the descending aorta into the abdominal portion. With the pigtail catheter positioned at approximately the level of T11, AP projection of the aorta was obtained demonstrating the origins of the renal arteries as well as the aneurysm. Occlusion of the right side was again confirmed.   Again, with Dr. Lucky Cowboy working simultaneously he accessed the common femoral artery with a Seldinger needle. Via this open exposure, J-wire is then advanced followed by placement of an 8 French sheath and a pigtail catheter. Pigtail catheter is advanced into the aorta. This then allowed for marking for choosing appropriate length of the endoprosthesis. After review of the images, a 36 x 14 x 100 main body was selected. The 8 French sheath upsized to a 20 [place] sheath. 5000 units of heparin had been given after access had been obtained and subsequently the main body was advanced up to the level of the renals. Adding 10 degrees of cranial view and magging up the image twice, secondary injection was made localizing the renal arteries and subsequently the main body was deployed without difficulty landing the covered portion of the  device approximately 1 mm below the renals. Suprarenal stent was then deployed.   Oblique  view in the RAO projection was then obtained with a marker, pigtail reintroduced, and a 16 x 13 x 90 limb was selected and then advanced over the stiff wire. It was then deployed. The entire system was postdilated with a Coda balloon. Follow-up angiography with delayed imaging demonstrated a very small type II endoleak which appears to have a persistent small blush being fed by the IMA. In other words, there appears to be very poor egress of the contrast which leads one to believe this will thrombose once his heparin has worn off. There are no type I or type III endoleaks noted.   The radial catheter was then removed over a wire and the sheath was pulled in the holding area and pressure held. The sheath in the open femoral exposure was then removed and repaired with Dr. Lucky Cowboy working on this portion utilizing interrupted 6-0 Prolene to repair the arterial defect and subsequently closing the wound with 2 mL of Evicel and then multiple layers of 2-0 and 3-0 Vicryl followed by 4-0 Monocryl subcuticular.   The patient tolerated the procedure well. There were no immediate complications. Sponge and needle counts were correct. He was taken to the recovery area in excellent condition.    ____________________________ Katha Cabal, MD ggs:drc D: 10/28/2012 10:58:39 ET T: 10/28/2012 11:24:40 ET JOB#: [id number]  cc: Katha Cabal, MD, <Dictator> Katha Cabal MD ELECTRONICALLY SIGNED 11/03/2012 12:13

## 2015-04-14 NOTE — Consult Note (Signed)
PATIENT NAME:  Stephen [name], FICKLE MR#:  [id number] DATE OF BIRTH:  05-27-1941  CARDIOLOGY CONSULTATION  REFERRING PHYSICIAN:  [place].  CONSULTING PHYSICIAN:  Isaias Cowman, MD  REASON FOR CONSULTATION: Postoperative cardiovascular management.   CHIEF COMPLAINT: "I had surgery."  REASON FOR HOSPITALIZATION: The patient is a 74 year old gentleman with known coronary artery disease. The patient was scheduled for preoperative bronchoscopy, left thoracotomy. Left thoracotomy performed on 04/12/2013. 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 The patient had a history of sarcoma of the left lower extremity, previously went on to surgery at [facility].   The patient denies chest pain. Postop EKG was unremarkable. The patient has remained in sinus rhythm.   PAST MEDICAL HISTORY: 1.  Status post acute inferior myocardial infarction, 02/27/2003.  2. Status post 2.5 x 13 mm Pixel stent, mid-right coronary artery, 02/28/2003.  3.  Status post left carotid endarterectomy, 10/30/2004.  4.  Known 100% stenosis, right iliac artery.  5.  Abdominal aortic aneurysm.  6.  Hyperlipidemia.  7.  History of sarcoma, left lower extremity.   MEDICATIONS ON ADMISSION: Atorvastatin 10 mg at bedtime, vitamin E 400 international units daily.   SOCIAL HISTORY: The patient is married, lives with his wife. The patient has a remote tobacco abuse history.   FAMILY HISTORY: Father and mother both history of congestive heart failure.   REVIEW OF SYSTEMS:   CONSTITUTIONAL: No fever or chills.  EYES: No blurry vision.  EARS: No hearing loss.  RESPIRATORY: The patient has mild exertional dyspnea.  CARDIOVASCULAR: The patient denies chest pain.  GASTROINTESTINAL: No nausea, vomiting, diarrhea, or constipation.  GENITOURINARY: No dysuria or hematuria.  ENDOCRINE: No polyuria or polydipsia.  INTEGUMENTARY: No rash.  MUSCULOSKELETAL: No arthralgias, myalgias.  NEUROLOGIC: No focal  muscle weakness or numbness.  PSYCHOLOGICAL: No depression or anxiety.   PHYSICAL EXAMINATION: VITAL SIGNS: Blood pressure 129/61, pulse 68, respirations 11, temperature 98.4, pulse ox 94%.  HEENT: Pupils equal, reactive to light and accommodation.  NECK: Supple, without thyromegaly.  LUNGS: Revealed decreased breath sounds in both bases.  CARDIOVASCULAR: Normal JVP. Normal PMI. Regular rate and rhythm. Normal S1, S2. No appreciable gallop, murmur, or rub.  ABDOMEN: Soft and nontender. Pulses were intact bilaterally.  MUSCULOSKELETAL: Normal muscle tone.  NEUROLOGIC: The patient is alert and oriented x3. Motor and sensory both grossly intact.   IMPRESSION: A 74 year old gentleman with known coronary artery disease status post prior myocardial infarction and stent, who underwent a successful left thoracotomy, left lower lobectomy, as well as mediastinal lymphadenectomy for metastatic sarcoma. The patient tolerated the procedure well, without cardiovascular complication. The patient currently denies chest pain.   RECOMMENDATIONS: 1.  Agree with current therapy.  2.  Would defer further cardiac diagnostics at this time.     ____________________________ Isaias Cowman, MD ap:dm D: 04/13/2013 12:11:24 ET T: 04/13/2013 12:18:19 ET JOB#: [id number]  cc: Isaias Cowman, MD, <Dictator> Isaias Cowman MD ELECTRONICALLY SIGNED 05/03/2013 13:44

## 2015-04-14 NOTE — Discharge Summary (Signed)
PATIENT NAME:  Stephen [name], Stephen [name] MR#:  [id number] DATE OF BIRTH:  23-Mar-1941  DATE OF ADMISSION:  04/12/2013 DATE OF DISCHARGE:  04/17/2013  ADMITTING DIAGNOSIS: Left lower lobe lung mass.   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 Several years later, he presented with a mass in his left thigh which was a sarcoma, and this was treated at an outside institution. Several months ago he had another CT scan done which revealed a rounded lesion in the left lower lobe which was new as well as an enlarging left lower lobe mass. The rounded lesion was biopsied and was proven to be a metastatic sarcoma. The patient was taken to the Operating Room on the 21st of April, at which time he underwent a left thoracotomy and a left lower lobectomy as well as a left upper lobe wedge resection. 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 The patient's postoperative course was essentially unremarkable. He did well overnight in the Intensive Care Unit and then was sent to the floor where he recovered nicely. His chest tubes were removed on the 25th of April, and he was discharged to home on 04/17/2013.   FOLLOWUP:  He was instructed to follow up with Dr. Genevive Bi in 1 week.   DISCHARGE MEDICATIONS:  Atorvastatin 10 mg once a day, vitamin E 1 tablet as needed, and acetaminophen/hydrocodone 325, 5 mg 1 to 2 tablets every 4 to 6 hours as needed.   ____________________________ Lew Dawes Genevive Bi, MD teo:cb D: 05/03/2013 13:33:52 ET T: 05/03/2013 15:08:56 ET JOB#: [id number]  cc: Christia Reading E. Genevive Bi, MD, <Dictator> Louis Matte MD ELECTRONICALLY SIGNED 05/06/2013 12:20

## 2015-04-14 NOTE — Consult Note (Signed)
Brief Consult Note: Diagnosis: Stable s/p left thoracotomy.   Patient was seen by consultant.   Consult note dictated.   Comments: REC  Agree with current therapy, no further cardiac diagnostics at this time.  Electronic Signatures: Isaias Cowman (MD)  (Signed 22-Apr-14 12:12)  Authored: Brief Consult Note   Last Updated: 22-Apr-14 12:12 by Isaias Cowman (MD)

## 2015-04-14 NOTE — Op Note (Signed)
PATIENT NAME:  Stephen [name], MIMBS MR#:  [id number] DATE OF BIRTH:  06-12-1941  DATE OF PROCEDURE:  04/12/2013  SURGEON: Nestor Lewandowsky, MD.   ASSISTANT: Bronson Ing, MD.   PREOPERATIVE DIAGNOSIS: Left lower lobe mass (prior frozen section showing at least one mass to be metastatic sarcoma).   POSTOPERATIVE DIAGNOSIS: Left lower lobe adenocarcinoma and left lower lobe metastatic sarcoma.   OPERATION PERFORMED:  1. Preoperative bronchoscopy to assess endobronchial anatomy.  2. Left thoracotomy with left lower lobectomy and mediastinal lymphadenectomy.  3. Wedge resection left upper lobe mass.   INDICATIONS FOR PROCEDURE: The patient is a 75 year old gentleman who was recently found to have a sarcoma involving his left thigh. He has been followed with serial CT scans, and a recent CT scan confirmed the presence of a new rounded lesion in the left lower lobe. This was biopsied and was a metastatic sarcoma. The CT scan also showed the presence of a second tumor in the superior segment of the left lower lobe that was more concerning for primary lung cancer. In addition, there was a lesion in the left upper lobe identified on the preoperative CT. For all of these reasons, the patient was offered the above-named procedure. The indications and risks were explained to the patient, and he gave his informed consent.   DESCRIPTION OF PROCEDURE: The patient was brought to the operating suite and placed in the supine position. General endotracheal anesthesia was given with a double-lumen tube. Preoperative bronchoscopy was carried out. The right upper lobe bronchial stump was normal. There was no evidence of malignancy on either right or left sides. There was no endobronchial tumor. The endotracheal tube was positioned appropriately. The patient was then turned for a left thoracotomy. All pressure points were carefully padded. The patient was prepped and draped in the usual sterile fashion. A posterolateral fifth  interspace thoracotomy was performed. The chest was entered. Retractors were placed. The inferior pulmonary ligament was divided to the level of the inferior pulmonary vein. A complete palpation of the lung revealed 3 lesions. There was one in the left upper lobe which was very firm, almost as if it was an ossified structure. It measured less than 1 cm. There was a rounded metastatic-appearing lesion in the left lower lobe, and there was a second lesion in the superior segment of the left lower lobe that was less defined and felt more like a primary lung cancer. There were no other additional lesions identified with certainty. We began by excising the ill-defined lesion in the superior segment of the left lower lobe. This was sent for frozen section. We then excised the other lesions as well using the endoscopic stapler. The pathology returned an adenocarcinoma of the left lower lobe in the superior segment with probable positive margins. It was not possible to take any additional areas of the lung, so we therefore elected to perform a left lower lobectomy. The fissure was created both anteriorly and posteriorly with the stapling device. The arteries to the lower lobe as well as to the lingula were identified. The inferior pulmonary vein was encircled. The arteries to the lower lobe were taken with the vascular stapler, as was the inferior pulmonary vein. The bronchus was transected with a stapling device using heavy green load on the stapler. The specimen was then passed off the field. The chest was irrigated, and we leak checked the bronchial stump at 30 cm of water pressure. There was no evidence of an air leak. The chest was  then drained with 2 chest tubes. The angled tube was positioned along the paravertebral space posteriorly, and an anterior chest tube was positioned to the apex. Hemostasis was complete. The chest was then closed with multiple #2 Vicryl paracostal sutures. The muscles of the chest wall were  closed with #2 Vicryl, subcutaneous tissues with 2-0 Vicryl and the skin with skin clips. The patient tolerated the procedure well and was rolled into the supine position where he was extubated and taken to the recovery room in stable condition.    ____________________________ Lew Dawes Genevive Bi, MD teo:gb D: 04/12/2013 18:32:41 ET T: 04/13/2013 05:20:15 ET JOB#: [id number]  cc: Christia Reading E. Genevive Bi, MD, <Dictator> Janak K. Oliva Bustard, MD Micheline Maze, MD Louis Matte MD ELECTRONICALLY SIGNED 04/21/2013 7:36

## 2015-04-15 NOTE — Discharge Summary (Signed)
PATIENT NAME:  Stephen [name], Stephen [name] MR#:  [id number] DATE OF BIRTH:  Aug 20, 1941  DATE OF ADMISSION:  01/19/2014 DATE OF DISCHARGE:  01/28/2014  ADMITTING DIAGNOSIS: Left upper lobe mass.   DISCHARGE DIAGNOSIS: Metastatic sarcoma, left upper lobe.   OPERATION PERFORMED: Left thoracotomy and left lung resection of metastatic sarcoma.   HOSPITAL COURSE: [name] is a 74 year old gentleman who had previously undergone a right thoracotomy for lung cancer as well as a left thoracotomy for lung cancer and metastatic sarcoma. He was found to have an enlarging nodule in the remaining portion of his left upper lobe, and he was brought to the operating room on 01/19/2014, where he underwent a redo left thoracotomy for excision of a metastatic sarcoma. The intraoperative findings were consistent with metastatic sarcoma, and he underwent a wedge resection, which returned metastatic sarcoma with negative margins. The patient's postoperative course was uncomplicated. He did have an air leak for several days, but on the date of discharge, his lung remained completely expanded without any evidence of pneumothorax. The tubes were removed, and a post chest tube removal chest x-ray showed no evidence of a pneumothorax. At the time of discharge, his wounds were healing as expected. He was tolerating a regular diet and eating well. He was afebrile. He was given a prescription for Percocet for pain and will follow up with Dr. Genevive Bi in 1 week.     ____________________________ Lew Dawes. Genevive Bi, MD teo:lb D: 02/02/2014 08:48:59 ET T: 02/02/2014 09:15:06 ET JOB#: [id number]  cc: Christia Reading E. Genevive Bi, MD, <Dictator> Louis Matte MD ELECTRONICALLY SIGNED 02/20/2014 19:41

## 2015-04-15 NOTE — Op Note (Signed)
PATIENT NAME:  Stephen [name], Stephen [name] MR#:  [id number] DATE OF BIRTH:  Jul 20, 1941  DATE OF PROCEDURE:  01/19/2014  SURGEON: Christia Reading E. Genevive Bi, MD  ASSISTANT:  Dr. Bronson Ing and  Ms. Lajean Saver, PA Student.   PREOPERATIVE DIAGNOSIS: Metastatic sarcoma, left upper lobe.   POSTOPERATIVE DIAGNOSIS:  Metastatic sarcoma, left upper lobe.  OPERATION PERFORMED: 1.  Preoperative bronchoscopy to assess endobronchial anatomy.  2.  Redo left thoracotomy with wedge resection, left upper lobe tumor.  INDICATIONS FOR PROCEDURE:  Mr. Davisson is a 74 year old gentleman who had previously undergone a right upper lobectomy 10 years ago and a left lower lobectomy 1 year ago. He presented with a recurrence of his sarcoma in his left upper lobe, which was proven on biopsy. He was apprised of the indications and risks of redo thoracotomy for resection of his left upper lobe mass. The indications and risks were explained and the patient gave informed consent.   DESCRIPTION OF PROCEDURE: The patient was brought to the operating suite and placed in the supine position. General endotracheal anesthesia was given with a double-lumen tube. Preoperative bronchoscopy was carried out. The right upper lobe stump and the left lower lobe stump were both free of any defects or tumor. There was no evidence of any other endobronchial tumor. There were no purulent secretions. The tube was properly positioned, and the patient was turned for a left thoracotomy. All pressure points were carefully padded. The patient was prepped and draped in the usual sterile fashion. The previous thoracotomy wound was reopened. I took considerable care to get through the intercostal spaces. However, once we found the pleural space, we were able to completely dissect the lung off the ribs and off the mediastinum. The mass was palpable and the remaining portion of the left lung was free of any other tumor nodule. I tried to identify the superior pulmonary vein, but the  area of the pulmonary vein was densely adherent to the mediastinum. I attempted to do so to: 1) identify the vein and 2) to give [place] a little more length on the lung to keep away from the superior pulmonary vein. In any event, we elevated the tumor up out of the lung, and using multiple firings of the endoscopic stapler, we excised this mass. The mass was sent for a permanent section. The chest was copiously irrigated and areas of parenchymal defect that were created during our dissection were closed as best we could with Prolene sutures on the pleural surface. Progel was used on the cut surfaces of the lung and on several parenchymal injuries. Two chest tubes were inserted in standard fashion and brought out through separate stab wounds. The chest was then closed in multiple layers; #2 Vicryl was used to approximate the ribs. The muscles of the chest wall were closed with 2-0 Vicryl, subcutaneous tissues with 2-0 Vicryl and the skin with skin clips. Sterile dressings were applied. The patient was then extubated and taken to the recovery room in stable condition.     ____________________________ Lew Dawes Genevive Bi, MD teo:dmm D: 01/19/2014 16:17:00 ET T: 01/19/2014 19:05:52 ET JOB#: [id number]  cc: Christia Reading E. Genevive Bi, MD, <Dictator> Bronson Ing, MD Martie Lee. Oliva Bustard, MD Louis Matte MD ELECTRONICALLY SIGNED 01/31/2014 7:47

## 2015-06-07 DIAGNOSIS — E782 Mixed hyperlipidemia: Secondary | ICD-10-CM | POA: Insufficient documentation

## 2015-06-07 DIAGNOSIS — I714 Abdominal aortic aneurysm, without rupture, unspecified: Secondary | ICD-10-CM | POA: Insufficient documentation

## 2015-06-07 DIAGNOSIS — I739 Peripheral vascular disease, unspecified: Secondary | ICD-10-CM | POA: Insufficient documentation

## 2015-06-07 DIAGNOSIS — I2510 Atherosclerotic heart disease of native coronary artery without angina pectoris: Secondary | ICD-10-CM | POA: Insufficient documentation

## 2015-07-03 ENCOUNTER — Other Ambulatory Visit: Payer: Self-pay | Admitting: *Deleted

## 2015-07-03 DIAGNOSIS — C3490 Malignant neoplasm of unspecified part of unspecified bronchus or lung: Secondary | ICD-10-CM

## 2015-07-04 ENCOUNTER — Ambulatory Visit
Admission: RE | Admit: 2015-07-04 | Discharge: 2015-07-04 | Disposition: A | Discharge status: Home / Self Care | Payer: Medicare Other | Source: Ambulatory Visit | Attending: Oncology | Admitting: Oncology

## 2015-07-04 ENCOUNTER — Other Ambulatory Visit: Payer: Self-pay | Admitting: *Deleted

## 2015-07-04 ENCOUNTER — Inpatient Hospital Stay: Payer: Medicare Other | Attending: Oncology

## 2015-07-04 DIAGNOSIS — C3432 Malignant neoplasm of lower lobe, left bronchus or lung: Secondary | ICD-10-CM | POA: Insufficient documentation

## 2015-07-04 DIAGNOSIS — I2510 Atherosclerotic heart disease of native coronary artery without angina pectoris: Secondary | ICD-10-CM | POA: Diagnosis not present

## 2015-07-04 DIAGNOSIS — Z08 Encounter for follow-up examination after completed treatment for malignant neoplasm: Secondary | ICD-10-CM | POA: Insufficient documentation

## 2015-07-04 DIAGNOSIS — C7989 Secondary malignant neoplasm of other specified sites: Secondary | ICD-10-CM | POA: Insufficient documentation

## 2015-07-04 DIAGNOSIS — C3490 Malignant neoplasm of unspecified part of unspecified bronchus or lung: Secondary | ICD-10-CM

## 2015-07-04 DIAGNOSIS — Z8673 Personal history of transient ischemic attack (TIA), and cerebral infarction without residual deficits: Secondary | ICD-10-CM | POA: Insufficient documentation

## 2015-07-04 DIAGNOSIS — C7800 Secondary malignant neoplasm of unspecified lung: Secondary | ICD-10-CM | POA: Insufficient documentation

## 2015-07-04 DIAGNOSIS — Z85118 Personal history of other malignant neoplasm of bronchus and lung: Secondary | ICD-10-CM | POA: Insufficient documentation

## 2015-07-04 DIAGNOSIS — Z87891 Personal history of nicotine dependence: Secondary | ICD-10-CM | POA: Insufficient documentation

## 2015-07-04 DIAGNOSIS — Z79899 Other long term (current) drug therapy: Secondary | ICD-10-CM | POA: Insufficient documentation

## 2015-07-04 DIAGNOSIS — Z85831 Personal history of malignant neoplasm of soft tissue: Secondary | ICD-10-CM

## 2015-07-04 DIAGNOSIS — C569 Malignant neoplasm of unspecified ovary: Secondary | ICD-10-CM

## 2015-07-04 HISTORY — DX: Malignant (primary) neoplasm, unspecified: C80.1

## 2015-07-04 MED ORDER — IOHEXOL 300 MG/ML  SOLN
75.0000 mL | Freq: Once | INTRAMUSCULAR | Status: AC | PRN
  Administered 2015-07-04: 75 mL via INTRAVENOUS

## 2015-07-06 ENCOUNTER — Inpatient Hospital Stay: Payer: Medicare Other

## 2015-07-06 ENCOUNTER — Inpatient Hospital Stay (HOSPITAL_BASED_OUTPATIENT_CLINIC_OR_DEPARTMENT_OTHER): Payer: Medicare Other | Admitting: Oncology

## 2015-07-06 VITALS — BP 166/89 | HR 59 | Temp 95.5°F | Wt 230.6 lb

## 2015-07-06 DIAGNOSIS — C3432 Malignant neoplasm of lower lobe, left bronchus or lung: Secondary | ICD-10-CM | POA: Diagnosis not present

## 2015-07-06 DIAGNOSIS — Z8673 Personal history of transient ischemic attack (TIA), and cerebral infarction without residual deficits: Secondary | ICD-10-CM

## 2015-07-06 DIAGNOSIS — C3490 Malignant neoplasm of unspecified part of unspecified bronchus or lung: Secondary | ICD-10-CM

## 2015-07-06 DIAGNOSIS — Z79899 Other long term (current) drug therapy: Secondary | ICD-10-CM

## 2015-07-06 DIAGNOSIS — Z87891 Personal history of nicotine dependence: Secondary | ICD-10-CM

## 2015-07-06 DIAGNOSIS — C7989 Secondary malignant neoplasm of other specified sites: Secondary | ICD-10-CM | POA: Diagnosis not present

## 2015-07-06 DIAGNOSIS — I2510 Atherosclerotic heart disease of native coronary artery without angina pectoris: Secondary | ICD-10-CM | POA: Diagnosis not present

## 2015-07-06 DIAGNOSIS — C7800 Secondary malignant neoplasm of unspecified lung: Secondary | ICD-10-CM | POA: Diagnosis not present

## 2015-07-06 LAB — CBC WITH DIFFERENTIAL/PLATELET
Basophils Absolute: 0.1 10*3/uL (ref 0–0.1)
Basophils Relative: 1 %
EOS ABS: 0.1 10*3/uL (ref 0–0.7)
Eosinophils Relative: 3 %
HCT: 51.1 % (ref 40.0–52.0)
HEMOGLOBIN: 16.8 g/dL (ref 13.0–18.0)
Lymphocytes Relative: 36 %
Lymphs Abs: 1.6 10*3/uL (ref 1.0–3.6)
MCH: 29.3 pg (ref 26.0–34.0)
MCHC: 32.9 g/dL (ref 32.0–36.0)
MCV: 89 fL (ref 80.0–100.0)
Monocytes Absolute: 0.4 10*3/uL (ref 0.2–1.0)
Monocytes Relative: 8 %
Neutro Abs: 2.4 10*3/uL (ref 1.4–6.5)
Neutrophils Relative %: 52 %
Platelets: 189 10*3/uL (ref 150–440)
RBC: 5.75 MIL/uL (ref 4.40–5.90)
RDW: 12.9 % (ref 11.5–14.5)
WBC: 4.5 10*3/uL (ref 3.8–10.6)

## 2015-07-06 LAB — COMPREHENSIVE METABOLIC PANEL
ALT: 22 U/L (ref 17–63)
AST: 28 U/L (ref 15–41)
Albumin: 4.1 g/dL (ref 3.5–5.0)
Alkaline Phosphatase: 61 U/L (ref 38–126)
Anion gap: 8 (ref 5–15)
BILIRUBIN TOTAL: 0.7 mg/dL (ref 0.3–1.2)
BUN: 16 mg/dL (ref 6–20)
CO2: 25 mmol/L (ref 22–32)
Calcium: 8.8 mg/dL — ABNORMAL LOW (ref 8.9–10.3)
Chloride: 104 mmol/L (ref 101–111)
Creatinine, Ser: 0.98 mg/dL (ref 0.61–1.24)
GFR calc Af Amer: >60 mL/min (ref >60)
GFR calc non Af Amer: >60 mL/min (ref >60)
Glucose, Bld: 125 mg/dL — ABNORMAL HIGH (ref 65–99)
POTASSIUM: 4.4 mmol/L (ref 3.5–5.1)
Sodium: 137 mmol/L (ref 135–145)
TOTAL PROTEIN: 7.3 g/dL (ref 6.5–8.1)

## 2015-07-06 NOTE — Progress Notes (Signed)
Patient does not have living will.  Declined information,  Former smoker.

## 2015-07-16 ENCOUNTER — Encounter: Payer: Self-pay | Admitting: Oncology

## 2015-07-16 DIAGNOSIS — C3490 Malignant neoplasm of unspecified part of unspecified bronchus or lung: Secondary | ICD-10-CM

## 2015-07-16 HISTORY — DX: Malignant neoplasm of unspecified part of unspecified bronchus or lung: C34.90

## 2015-07-16 NOTE — Progress Notes (Signed)
[place] @ [facility] [phone]  Fax:([phone]) [place]: 07/17/1941  MR#: [id number]  KZS#:[id number]  Patient Care Team: Forest Gleason, MD as PCP - General (Oncology)  CHIEF COMPLAINT:  Chief Complaint  Patient presents with  . Follow-up    Oncology History   Chief Complaint/Problem List:  1. Carcinoma of lung adenocarcinoma. T2N0M0 tumor. Status-post right thoracotomy and upper lobectomy. Mediastinal lymph nodes are negative. Stage I-B. 2. Diagnosis of sarcoma.  Left thigh (at [facility], [facility]    neo adjuvant radiation  and surgery ,  In November of 2011.pathology report shows poorly differentiated sarcoma, high grade 3. 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  (April, 2014) 1 nodule was positive for metastatic sarcoma.  2nd nodule is positive for adenocarcinoma from lung T1, N0, M0  margins were negative   3 rd nodule was benign.  6.Resection of the left upper lobe lung nodule December, 2014 consistent with [place] recurrent metastatic sarcoma     Lung cancer   07/16/2015 Initial Diagnosis Lung cancer       INTERVAL HISTORY:  75 year old gentleman who had a previous history of sarcoma of the left lower extremity with metastases to the lungs status post resection.  Also had bilateral primary carcinoma of lung which were resected.  Patient has stopped smoking.  No cough no shortness of breath no hemoptysis had a repeat CT scan here for further discussion and evaluation. The diet is stable.  Has not lost any weight  REVIEW OF SYSTEMS:   GENERAL:  Feels good.  Active.  No fevers, sweats or weight loss. PERFORMANCE STATUS (ECOG):  01 HEENT:  No visual changes, runny nose, sore throat, mouth sores or tenderness. Lungs: No shortness of breath or cough.  No hemoptysis. Cardiac:  No chest pain, palpitations, orthopnea, or PND. GI:  No nausea, vomiting,  diarrhea, constipation, melena or hematochezia. GU:  No urgency, frequency, dysuria, or hematuria. Musculoskeletal:  No back pain.  No joint pain.  No muscle tenderness. Extremities:  No pain or swelling. Skin:  No rashes or skin changes. Neuro:  No headache, numbness or weakness, balance or coordination issues. Endocrine:  No diabetes, thyroid issues, hot flashes or night sweats. Psych:  No mood changes, depression or anxiety. Pain:  No focal pain. Review of systems:  All other systems reviewed and found to be negative. As per HPI. Otherwise, a complete review of systems is negatve.  PAST MEDICAL HISTORY: Past Medical History  Diagnosis Date  . Cancer     Lung CA- Bilateral  . Lung cancer 07/16/2015     No Known Allergies:   Significant History/PMH:   HOH:    PVD:    CAD:    H/O Lung Cancer on Right:    CVA/Stroke:    MI - Myocardial Infarct:    Sertoma Removal:    Abdominal Aortic Aneurysm Repair:    Carotid Stent Placement:    Cardiac Stent: Mar 2004   Lobectomy-right:    Neck Surgery:   Preventive Screening:  Has patient had any of the following test? Colonscopy   Last Colonoscopy: 2 years ago   Smoking History: Smoking History Never Smoked.  PFSH: Additional Past Medical and Surgical History: Allergies:  Not allergic to any medication    Past Medical History:  Coronary artery disease    Past Surgical History:  Had a stent placement for coronary artery  disease  Had carotid endarterectomy on both sides  AAA repair    Family History:  Family history of breast cancer, diabetes and heart disease.    Social History:  Patient has smoked for approximately 40 years and quit smoking 3 years ago. Does not drink alcohol.  Married.  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    ADVANCED DIRECTIVES:  Patient does not have any living will or healthcare power of attorney.  Information was given .  Available resources had been discussed.  We will follow-up on subsequent  appointments regarding this issue HEALTH MAINTENANCE: History  Substance Use Topics  . Smoking status: Former Research scientist (life sciences)  . Smokeless tobacco: Not on file  . Alcohol Use: Not on file      No Known Allergies  Current Outpatient Prescriptions  Medication Sig Dispense Refill  . aspirin 81 MG chewable tablet Chew by mouth.     No current facility-administered medications for this visit.    OBJECTIVE:  Filed Vitals:   07/06/15 1130  BP: 166/89  Pulse: 59  Temp: 95.5 F (35.3 C)     There is no height on file to calculate BMI.    ECOG FS:1 - Symptomatic but completely ambulatory  PHYSICAL EXAM: GENERAL:  Well developed, well nourished, sitting comfortably in the exam room in no acute distress. MENTAL STATUS:  Alert and oriented to person, place and time. HEAD:  .  Normocephalic, atraumatic, face symmetric, no Cushingoid features.  RESPIRATORY:  Clear to auscultation without rales, wheezes or rhonchi. CARDIOVASCULAR:  Regular rate and rhythm without murmur, rub or gallop. . ABDOMEN:  Soft, non-tender, with active bowel sounds, and no hepatosplenomegaly.  No masses. BACK:  No CVA tenderness.  No tenderness on percussion of the back or rib cage. SKIN:  No rashes, ulcers or lesions. EXTREMITIES: No edema, no skin discoloration or tenderness.  No palpable cords. Left lower extremity there is no evidence of recurrent disease at previously resected sarcoma site LYMPH NODES: No palpable cervical, supraclavicular, axillary or inguinal adenopathy  NEUROLOGICAL: Unremarkable. PSYCH:  Appropriate.   LAB RESULTS:  Appointment on 07/06/2015  Component Date Value Ref Range Status  . WBC 07/06/2015 4.5  3.8 - 10.6 K/uL Final  . RBC 07/06/2015 5.75  4.40 - 5.90 MIL/uL Final  . Hemoglobin 07/06/2015 16.8  13.0 - 18.0 g/dL Final  . HCT 07/06/2015 51.1  40.0 - 52.0 % Final  . MCV 07/06/2015 89.0  80.0 - 100.0 fL Final  . MCH 07/06/2015 29.3  26.0 - 34.0 pg Final  . MCHC 07/06/2015 32.9   32.0 - 36.0 g/dL Final  . RDW 07/06/2015 12.9  11.5 - 14.5 % Final  . Platelets 07/06/2015 189  150 - 440 K/uL Final  . Neutrophils Relative % 07/06/2015 52   Final  . Neutro Abs 07/06/2015 2.4  1.4 - 6.5 K/uL Final  . Lymphocytes Relative 07/06/2015 36   Final  . Lymphs Abs 07/06/2015 1.6  1.0 - 3.6 K/uL Final  . Monocytes Relative 07/06/2015 8   Final  . Monocytes Absolute 07/06/2015 0.4  0.2 - 1.0 K/uL Final  . Eosinophils Relative 07/06/2015 3   Final  . Eosinophils Absolute 07/06/2015 0.1  0 - 0.7 K/uL Final  . Basophils Relative 07/06/2015 1   Final  . Basophils Absolute 07/06/2015 0.1  0 - 0.1 K/uL Final  . Sodium 07/06/2015 137  135 - 145 mmol/L Final  . Potassium 07/06/2015 4.4  3.5 - 5.1 mmol/L Final  . Chloride 07/06/2015  104  101 - 111 mmol/L Final  . CO2 07/06/2015 25  22 - 32 mmol/L Final  . Glucose, Bld 07/06/2015 125* 65 - 99 mg/dL Final  . BUN 07/06/2015 16  6 - 20 mg/dL Final  . Creatinine, Ser 07/06/2015 0.98  0.61 - 1.24 mg/dL Final  . Calcium 07/06/2015 8.8* 8.9 - 10.3 mg/dL Final  . Total Protein 07/06/2015 7.3  6.5 - 8.1 g/dL Final  . Albumin 07/06/2015 4.1  3.5 - 5.0 g/dL Final  . AST 07/06/2015 28  15 - 41 U/L Final  . ALT 07/06/2015 22  17 - 63 U/L Final  . Alkaline Phosphatase 07/06/2015 61  38 - 126 U/L Final  . Total Bilirubin 07/06/2015 0.7  0.3 - 1.2 mg/dL Final  . GFR calc non Af Amer 07/06/2015 >60  >60 mL/min Final  . GFR calc Af Amer 07/06/2015 >60  >60 mL/min Final   Comment: (NOTE) The eGFR has been calculated using the CKD EPI equation. This calculation has not been validated in all clinical situations. eGFR's persistently <60 mL/min signify possible Chronic Kidney Disease.   . Anion gap 07/06/2015 8  5 - 15 Final      STUDIES: Ct Chest W Contrast  07/04/2015   CLINICAL DATA:  Lung cancer diagnosis 2 years ago, possibly metastatic from sarcoma. Shortness of breath. Cough.  EXAM: CT CHEST WITH CONTRAST  TECHNIQUE: Multidetector CT imaging  of the chest was performed during intravenous contrast administration.  CONTRAST:  20m OMNIPAQUE IOHEXOL 300 MG/ML  SOLN  COMPARISON:  Multiple exams, including 06/28/2014  FINDINGS: Mediastinum/Nodes: Subcutaneous cyst or similar lesion along the left upper back at the T1 level. Similar to prior, not previously hypermetabolic.  Coronary, aortic arch, and branch vessel atherosclerotic vascular disease. Mild cardiomegaly.  Lungs/Pleura: Postoperative volume loss, left hemithorax. Fatty posterior pleural density on the right, appears benign.  Upper abdomen: Unremarkable  Musculoskeletal: Left posterior rib deformities and right lateral rib deformities from old fractures or osteotomies.  IMPRESSION: 1. No active lung lesions identified. Postoperative findings as noted above. 2. Coronary, aortic arch, and branch vessel atherosclerotic vascular disease.   Electronically Signed   By: WVan ClinesM.D.   On: 07/04/2015 14:20    ASSESSMENT: 1.  Bilateral adenocarcinoma of lung status post resection 2.  Sarcoma of left lower extremity (soft tissue) metastases to lung status post resection  MEDICAL DECISION MAKING:  It is scan has been reviewed.  And also reviewed with the patient and there is no evidence of recurrent or progressive disease.  Reevaluation in 6 months at another CT scan.  Or if needed further evaluation in between the patient develops any new symptoms  Patient expressed understanding and was in agreement with this plan. He also understands that He can call clinic at any time with any questions, concerns, or complaints.    No matching staging information was found for the patient.  JForest Gleason MD   07/16/2015 1:18 PM

## 2016-01-05 ENCOUNTER — Ambulatory Visit: Payer: Medicare Other

## 2016-01-05 ENCOUNTER — Ambulatory Visit
Admission: RE | Admit: 2016-01-05 | Discharge: 2016-01-05 | Disposition: A | Discharge status: Home / Self Care | Payer: Medicare Other | Source: Ambulatory Visit | Attending: Oncology | Admitting: Oncology

## 2016-01-05 DIAGNOSIS — C3490 Malignant neoplasm of unspecified part of unspecified bronchus or lung: Secondary | ICD-10-CM | POA: Insufficient documentation

## 2016-01-05 DIAGNOSIS — I2510 Atherosclerotic heart disease of native coronary artery without angina pectoris: Secondary | ICD-10-CM | POA: Insufficient documentation

## 2016-01-05 DIAGNOSIS — I700 Atherosclerosis of aorta: Secondary | ICD-10-CM | POA: Insufficient documentation

## 2016-01-08 ENCOUNTER — Inpatient Hospital Stay: Payer: Medicare Other | Attending: Oncology | Admitting: Oncology

## 2016-01-08 ENCOUNTER — Encounter: Payer: Self-pay | Admitting: Oncology

## 2016-01-08 ENCOUNTER — Inpatient Hospital Stay: Payer: Medicare Other

## 2016-01-08 VITALS — BP 154/90 | HR 76 | Temp 96.7°F | Wt 230.2 lb

## 2016-01-08 DIAGNOSIS — C3432 Malignant neoplasm of lower lobe, left bronchus or lung: Secondary | ICD-10-CM | POA: Insufficient documentation

## 2016-01-08 DIAGNOSIS — Z7982 Long term (current) use of aspirin: Secondary | ICD-10-CM | POA: Diagnosis not present

## 2016-01-08 DIAGNOSIS — I739 Peripheral vascular disease, unspecified: Secondary | ICD-10-CM | POA: Insufficient documentation

## 2016-01-08 DIAGNOSIS — I252 Old myocardial infarction: Secondary | ICD-10-CM | POA: Diagnosis not present

## 2016-01-08 DIAGNOSIS — C3490 Malignant neoplasm of unspecified part of unspecified bronchus or lung: Secondary | ICD-10-CM

## 2016-01-08 DIAGNOSIS — C7801 Secondary malignant neoplasm of right lung: Secondary | ICD-10-CM | POA: Insufficient documentation

## 2016-01-08 DIAGNOSIS — Z8673 Personal history of transient ischemic attack (TIA), and cerebral infarction without residual deficits: Secondary | ICD-10-CM | POA: Insufficient documentation

## 2016-01-08 DIAGNOSIS — Z79899 Other long term (current) drug therapy: Secondary | ICD-10-CM | POA: Diagnosis not present

## 2016-01-08 DIAGNOSIS — Z87891 Personal history of nicotine dependence: Secondary | ICD-10-CM | POA: Insufficient documentation

## 2016-01-08 DIAGNOSIS — C7800 Secondary malignant neoplasm of unspecified lung: Secondary | ICD-10-CM | POA: Diagnosis not present

## 2016-01-08 DIAGNOSIS — C7989 Secondary malignant neoplasm of other specified sites: Secondary | ICD-10-CM | POA: Diagnosis not present

## 2016-01-08 DIAGNOSIS — I2510 Atherosclerotic heart disease of native coronary artery without angina pectoris: Secondary | ICD-10-CM | POA: Diagnosis not present

## 2016-01-08 LAB — COMPREHENSIVE METABOLIC PANEL
ALBUMIN: 3.9 g/dL (ref 3.5–5.0)
ALK PHOS: 69 U/L (ref 38–126)
ALT: 21 U/L (ref 17–63)
AST: 23 U/L (ref 15–41)
Anion gap: 7 (ref 5–15)
BUN: 16 mg/dL (ref 6–20)
CALCIUM: 8.9 mg/dL (ref 8.9–10.3)
CO2: 26 mmol/L (ref 22–32)
CREATININE: 0.88 mg/dL (ref 0.61–1.24)
Chloride: 99 mmol/L — ABNORMAL LOW (ref 101–111)
GFR calc Af Amer: >60 mL/min (ref >60)
GFR calc non Af Amer: >60 mL/min (ref >60)
GLUCOSE: 108 mg/dL — AB (ref 65–99)
Potassium: 4 mmol/L (ref 3.5–5.1)
SODIUM: 132 mmol/L — AB (ref 135–145)
TOTAL PROTEIN: 7.4 g/dL (ref 6.5–8.1)
Total Bilirubin: 0.8 mg/dL (ref 0.3–1.2)

## 2016-01-08 LAB — CBC WITH DIFFERENTIAL/PLATELET
BASOS PCT: 1 %
Basophils Absolute: 0.1 10*3/uL (ref 0–0.1)
Eosinophils Absolute: 0.1 10*3/uL (ref 0–0.7)
Eosinophils Relative: 2 %
HCT: 48.5 % (ref 40.0–52.0)
HEMOGLOBIN: 16.4 g/dL (ref 13.0–18.0)
LYMPHS ABS: 1.6 10*3/uL (ref 1.0–3.6)
Lymphocytes Relative: 28 %
MCH: 29.4 pg (ref 26.0–34.0)
MCHC: 33.9 g/dL (ref 32.0–36.0)
MCV: 86.9 fL (ref 80.0–100.0)
MONOS PCT: 7 %
Monocytes Absolute: 0.4 10*3/uL (ref 0.2–1.0)
NEUTROS ABS: 3.7 10*3/uL (ref 1.4–6.5)
NEUTROS PCT: 62 %
Platelets: 214 10*3/uL (ref 150–440)
RBC: 5.59 MIL/uL (ref 4.40–5.90)
RDW: 12.9 % (ref 11.5–14.5)
WBC: 6 10*3/uL (ref 3.8–10.6)

## 2016-01-08 NOTE — Progress Notes (Signed)
[place] @ [facility] Telephone:(336) [phone]  Fax:(336) [place]: 1941/08/21  MR#: [id number]  RKY#:[id number]  Patient Care Team: Forest Gleason, MD as PCP - General (Oncology)  CHIEF COMPLAINT:  Chief Complaint  Patient presents with  . Lung Cancer    Oncology History   Chief Complaint/Problem List:  1. Carcinoma of lung adenocarcinoma. T2N0M0 tumor. Status-post right thoracotomy and upper lobectomy. Mediastinal lymph nodes are negative. Stage I-B. 2. Diagnosis of sarcoma.  Left thigh (at [facility], [facility]    neo adjuvant radiation  and surgery ,  In November of 2011.pathology report shows poorly differentiated sarcoma, high grade 3. 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  (April, 2014) 1 nodule was positive for metastatic sarcoma.  2nd nodule is positive for adenocarcinoma from lung T1, N0, M0  margins were negative   3 rd nodule was benign.  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  Also had bilateral primary carcinoma of lung which were resected.  Patient has stopped smoking.  No cough no shortness of breath no hemoptysis had a repeat CT scan here for further discussion and evaluation. The diet is stable.  Has not lost any weight  REVIEW OF SYSTEMS:   GENERAL:  Feels good.  Active.  No fevers, sweats or weight loss. PERFORMANCE STATUS (ECOG):  01 HEENT:  No visual changes, runny nose, sore throat, mouth sores or tenderness. Lungs: No shortness of breath or cough.  No hemoptysis. Cardiac:  No chest pain, palpitations, orthopnea, or PND. GI:  No nausea,  vomiting, diarrhea, constipation, melena or hematochezia. GU:  No urgency, frequency, dysuria, or hematuria. Musculoskeletal:  No back pain.  No joint pain.  No muscle tenderness. Extremities:  No pain or swelling. Skin:  No rashes or skin changes. Neuro:  No headache, numbness or weakness, balance or coordination issues. Endocrine:  No diabetes, thyroid issues, hot flashes or night sweats. Psych:  No mood changes, depression or anxiety. Pain:  No focal pain. Review of systems:  All other systems reviewed and found to be negative. As per HPI. Otherwise, a complete review of systems is negatve.  PAST MEDICAL HISTORY: Past Medical History  Diagnosis Date  . Cancer ([place])     Lung CA- Bilateral  . Lung cancer ([place]) 07/16/2015     No Known Allergies:   Significant History/PMH:   HOH:    PVD:    CAD:    H/O Lung Cancer on Right:    CVA/Stroke:    MI - Myocardial Infarct:    Sertoma Removal:    Abdominal Aortic Aneurysm Repair:    Carotid Stent Placement:    Cardiac Stent: Mar 2004   Lobectomy-right:    Neck Surgery:   Preventive Screening:  Has patient had any of the following test? Colonscopy   Last Colonoscopy: 2 years ago   Smoking History: Smoking History Never Smoked.  PFSH: Additional Past Medical and Surgical History: Allergies:  Not allergic to any medication    Past Medical History:  Coronary artery disease    Past Surgical History:  Had a stent  placement for coronary artery disease  Had carotid endarterectomy on both sides  AAA repair    Family History:  Family history of breast cancer, diabetes and heart disease.    Social History:  Patient has smoked for approximately 40 years and quit smoking 3 years ago. Does not drink alcohol.  Married.  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    ADVANCED DIRECTIVES:  Patient does not have any living will or healthcare power of attorney.  Information was given .  Available resources had been discussed.  We will  follow-up on subsequent appointments regarding this issue HEALTH MAINTENANCE: Social History  Substance Use Topics  . Smoking status: Former Research scientist (life sciences)  . Smokeless tobacco: None  . Alcohol Use: None      No Known Allergies  Current Outpatient Prescriptions  Medication Sig Dispense Refill  . albuterol (PROAIR HFA) 108 (90 Base) MCG/ACT inhaler Inhale into the lungs.    Marland Kitchen aspirin 81 MG chewable tablet Chew by mouth.    Marland Kitchen HYDROcodone-homatropine (HYCODAN) 5-1.5 MG/5ML syrup Take by mouth.    Marland Kitchen b complex vitamins capsule Take by mouth.    . Cholecalciferol (VITAMIN D-3 PO) Take by mouth.    . Multiple Vitamin (MULTI-VITAMINS) TABS Take by mouth.    . Omega-3 Fatty Acids (FISH OIL CONCENTRATE) 300 MG CAPS Take by mouth.     No current facility-administered medications for this visit.    OBJECTIVE:  Filed Vitals:   01/08/16 1050  BP: 154/90  Pulse: 76  Temp: 96.7 F (35.9 C)     There is no height on file to calculate BMI.    ECOG FS:1 - Symptomatic but completely ambulatory  PHYSICAL EXAM: GENERAL:  Well developed, well nourished, sitting comfortably in the exam room in no acute distress. MENTAL STATUS:  Alert and oriented to person, place and time. HEAD:  .  Normocephalic, atraumatic, face symmetric, no Cushingoid features.  RESPIRATORY:  Clear to auscultation without rales, wheezes or rhonchi. CARDIOVASCULAR:  Regular rate and rhythm without murmur, rub or gallop. . ABDOMEN:  Soft, non-tender, with active bowel sounds, and no hepatosplenomegaly.  No masses. BACK:  No CVA tenderness.  No tenderness on percussion of the back or rib cage. SKIN:  No rashes, ulcers or lesions. EXTREMITIES: No edema, no skin discoloration or tenderness.  No palpable cords. Left lower extremity there is no evidence of recurrent disease at previously resected sarcoma site LYMPH NODES: No palpable cervical, supraclavicular, axillary or inguinal adenopathy  NEUROLOGICAL: Unremarkable. PSYCH:   Appropriate.   LAB RESULTS:  Appointment on 01/08/2016  Component Date Value Ref Range Status  . WBC 01/08/2016 6.0  3.8 - 10.6 K/uL Final  . RBC 01/08/2016 5.59  4.40 - 5.90 MIL/uL Final  . Hemoglobin 01/08/2016 16.4  13.0 - 18.0 g/dL Final  . HCT 01/08/2016 48.5  40.0 - 52.0 % Final  . MCV 01/08/2016 86.9  80.0 - 100.0 fL Final  . MCH 01/08/2016 29.4  26.0 - 34.0 pg Final  . MCHC 01/08/2016 33.9  32.0 - 36.0 g/dL Final  . RDW 01/08/2016 12.9  11.5 - 14.5 % Final  . Platelets 01/08/2016 214  150 - 440 K/uL Final  . Neutrophils Relative % 01/08/2016 62   Final  . Neutro Abs 01/08/2016 3.7  1.4 - 6.5 K/uL Final  . Lymphocytes Relative 01/08/2016 28   Final  . Lymphs Abs 01/08/2016 1.6  1.0 - 3.6 K/uL Final  . Monocytes Relative 01/08/2016 7   Final  .  Monocytes Absolute 01/08/2016 0.4  0.2 - 1.0 K/uL Final  . Eosinophils Relative 01/08/2016 2   Final  . Eosinophils Absolute 01/08/2016 0.1  0 - 0.7 K/uL Final  . Basophils Relative 01/08/2016 1   Final  . Basophils Absolute 01/08/2016 0.1  0 - 0.1 K/uL Final  . Sodium 01/08/2016 132* 135 - 145 mmol/L Final  . Potassium 01/08/2016 4.0  3.5 - 5.1 mmol/L Final  . Chloride 01/08/2016 99* 101 - 111 mmol/L Final  . CO2 01/08/2016 26  22 - 32 mmol/L Final  . Glucose, Bld 01/08/2016 108* 65 - 99 mg/dL Final  . BUN 01/08/2016 16  6 - 20 mg/dL Final  . Creatinine, Ser 01/08/2016 0.88  0.61 - 1.24 mg/dL Final  . Calcium 01/08/2016 8.9  8.9 - 10.3 mg/dL Final  . Total Protein 01/08/2016 7.4  6.5 - 8.1 g/dL Final  . Albumin 01/08/2016 3.9  3.5 - 5.0 g/dL Final  . AST 01/08/2016 23  15 - 41 U/L Final  . ALT 01/08/2016 21  17 - 63 U/L Final  . Alkaline Phosphatase 01/08/2016 69  38 - 126 U/L Final  . Total Bilirubin 01/08/2016 0.8  0.3 - 1.2 mg/dL Final  . GFR calc non Af Amer 01/08/2016 >60  >60 mL/min Final  . GFR calc Af Amer 01/08/2016 >60  >60 mL/min Final   Comment: (NOTE) The eGFR has been calculated using the CKD EPI equation. This  calculation has not been validated in all clinical situations. eGFR's persistently <60 mL/min signify possible Chronic Kidney Disease.   . Anion gap 01/08/2016 7  5 - 15 Final      STUDIES: Ct Chest Wo Contrast  01/05/2016  CLINICAL DATA:  Followup lung cancer EXAM: CT CHEST WITHOUT CONTRAST TECHNIQUE: Multidetector CT imaging of the chest was performed following the standard protocol without IV contrast. COMPARISON:  07/04/2015 FINDINGS: Mediastinum: There is aortic atherosclerosis and LAD, RCA and left circumflex coronary artery calcification. No mediastinal or hilar adenopathy. No axillary or supraclavicular adenopathy. Lungs/Pleura: No pleural fluid. There is moderate changes of centrilobular emphysema. Postop change in volume loss within the left lung identified. The appearance is stable when compared with the previous exam. There is no suspicious pulmonary nodule or mass identified at this time. Upper Abdomen: The right adrenal gland appears normal. Low attenuation nodule in the left adrenal gland measures 9 mm, image 58/ series 2. Unchanged from previous exam and likely representing a benign adenoma. The visualized portions of the liver and spleen appear normal. Musculoskeletal: Postoperative change involving the left posterior ribs identified. There are no aggressive lytic or sclerotic bone lesions. IMPRESSION: 1. No acute findings and no specific features to suggest residual or recurrence of tumor. 2. Aortic atherosclerosis and multi vessel coronary artery calcification. Electronically Signed   By: Kerby Moors M.D.   On: 01/05/2016 09:39    ASSESSMENT: 1.  Bilateral adenocarcinoma of lung status post resection 2.  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  There is no evidence of recurrent or progressive disease. He has a coronary artery  and carotid artery atherosclerosis being managed by vascular surgeon as well as by cardiologist All lab data has been reviewed no abnormality detected See patient in 6 month with a chest x-ray or before if patient has any problems Patient has slightly elevated blood pressure being managed by primary  care physician Patient expressed understanding and was in agreement with this plan. He also understands that He can call clinic at any time with any questions, concerns, or complaints.    No matching staging information was found for the patient.  Forest Gleason, MD   01/08/2016 11:11 AM

## 2016-07-08 ENCOUNTER — Other Ambulatory Visit: Payer: Medicare Other

## 2016-07-08 ENCOUNTER — Ambulatory Visit: Payer: Medicare Other | Admitting: Oncology

## 2016-07-08 ENCOUNTER — Inpatient Hospital Stay: Payer: Medicare Other

## 2016-07-08 ENCOUNTER — Inpatient Hospital Stay: Payer: Medicare Other | Attending: Internal Medicine | Admitting: Internal Medicine

## 2016-07-08 VITALS — BP 158/79 | HR 66 | Temp 97.2°F | Resp 18 | Wt 233.2 lb

## 2016-07-08 DIAGNOSIS — Z85830 Personal history of malignant neoplasm of bone: Secondary | ICD-10-CM | POA: Insufficient documentation

## 2016-07-08 DIAGNOSIS — Z85118 Personal history of other malignant neoplasm of bronchus and lung: Secondary | ICD-10-CM | POA: Diagnosis not present

## 2016-07-08 DIAGNOSIS — C3490 Malignant neoplasm of unspecified part of unspecified bronchus or lung: Secondary | ICD-10-CM

## 2016-07-08 DIAGNOSIS — C3412 Malignant neoplasm of upper lobe, left bronchus or lung: Secondary | ICD-10-CM

## 2016-07-08 LAB — COMPREHENSIVE METABOLIC PANEL
ALBUMIN: 3.9 g/dL (ref 3.5–5.0)
ALK PHOS: 64 U/L (ref 38–126)
ALT: 15 U/L — AB (ref 17–63)
AST: 20 U/L (ref 15–41)
Anion gap: 8 (ref 5–15)
BILIRUBIN TOTAL: 0.7 mg/dL (ref 0.3–1.2)
BUN: 18 mg/dL (ref 6–20)
CALCIUM: 8.9 mg/dL (ref 8.9–10.3)
CO2: 24 mmol/L (ref 22–32)
CREATININE: 0.98 mg/dL (ref 0.61–1.24)
Chloride: 101 mmol/L (ref 101–111)
GFR calc Af Amer: >60 mL/min (ref >60)
GFR calc non Af Amer: >60 mL/min (ref >60)
GLUCOSE: 120 mg/dL — AB (ref 65–99)
Potassium: 4.2 mmol/L (ref 3.5–5.1)
Sodium: 133 mmol/L — ABNORMAL LOW (ref 135–145)
TOTAL PROTEIN: 7.6 g/dL (ref 6.5–8.1)

## 2016-07-08 LAB — CBC WITH DIFFERENTIAL/PLATELET
BASOS ABS: 0.1 10*3/uL (ref 0–0.1)
BASOS PCT: 1 %
Eosinophils Absolute: 0.1 10*3/uL (ref 0–0.7)
Eosinophils Relative: 3 %
HEMATOCRIT: 46.9 % (ref 40.0–52.0)
HEMOGLOBIN: 16.2 g/dL (ref 13.0–18.0)
Lymphocytes Relative: 27 %
Lymphs Abs: 1.3 10*3/uL (ref 1.0–3.6)
MCH: 29.3 pg (ref 26.0–34.0)
MCHC: 34.5 g/dL (ref 32.0–36.0)
MCV: 85 fL (ref 80.0–100.0)
MONOS PCT: 8 %
Monocytes Absolute: 0.4 10*3/uL (ref 0.2–1.0)
NEUTROS ABS: 3 10*3/uL (ref 1.4–6.5)
NEUTROS PCT: 61 %
Platelets: 215 10*3/uL (ref 150–440)
RBC: 5.52 MIL/uL (ref 4.40–5.90)
RDW: 13.2 % (ref 11.5–14.5)
WBC: 4.9 10*3/uL (ref 3.8–10.6)

## 2016-07-08 MED ORDER — ALBUTEROL SULFATE HFA 108 (90 BASE) MCG/ACT IN AERS: 1.0000 | INHALATION_SPRAY | Freq: Four times a day (QID) | RESPIRATORY_TRACT | Status: DC | PRN

## 2016-07-08 NOTE — Progress Notes (Signed)
Patient c/o cough and SOB.  Requesting refill for Albuterol inhaler.

## 2016-07-08 NOTE — Progress Notes (Signed)
RF for albuterol sent to [facility]. Pt may call back at a later time to get the future RF sent to the [place] center in [place].

## 2016-07-08 NOTE — Assessment & Plan Note (Signed)
#   Left upper lobe stage I adenocarcinoma lung- status post resection. Clinically no evidence of recurrence noted.   # Sarcoma of the thigh- metastatic to the lung status post resection. Clinically no evidence of recurrence.   # Shortness of breath question COPD versus others - given his multiple malignancies high-risk for recurrence. Recommend CT noncontrast of the chest for further evaluation.  # Recommend follow-up in 6 months; again with a CT scan prior-this will need to be ordered. Labs normal.  # 25 minutes face-to-face with the patient discussing the above plan of care; more than 50% of time spent on prognosis/ natural history; counseling and coordination.

## 2016-07-08 NOTE — Progress Notes (Signed)
[place] OFFICE PROGRESS NOTE  Patient Care Team: Forest Gleason, MD as PCP - General (Oncology)  No matching staging information was found for the patient.   Oncology History   #  Diagnosis of sarcoma.  Left thigh (at [facility], [facility]    neo adjuvant radiation  and surgery ,  In November of 2011.pathology report shows poorly differentiated sarcoma, high grade.  # DEC 2015- s/p Left Upper lobe s/p wedge resection- metastatic pleomorphic sarcoma; # nodule- Benign osteoid. [Dr.Oaks]  # April 2014-  S/p LL lobectomy- [place] ca Stage I   # 2007-  Carcinoma of lung adenocarcinoma. T2N0M0 tumor. Status-post right thoracotomy and upper lobectomy. Stage I-B.  3. 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     Lung cancer ([place])   07/16/2015 Initial Diagnosis Lung cancer    Malignant neoplasm of upper lobe, left bronchus or lung [facility])   This is my first interaction with the patient as patient's primary oncologist has been Dr.Choksi. I reviewed the patient's prior charts/pertinent labs/imaging in detail; findings are summarized above.     INTERVAL HISTORY:  Stephen [name] 75 y.o.  male pleasant patient above history of High-grade sarcoma metastatic to the lung status post resection last December 2015; also history of lung cancer stage I a status post resection is here for follow-up  Patient complains of mild shortness of breath especially with exertion. He has also noted some cough; no hemoptysis. He has not been using his inhaler. Otherwise denies any new lumps or bumps. Appetite is good. No weight loss.   REVIEW OF SYSTEMS:  A complete 10 point review of system is done which is negative except mentioned above/history of present illness.   PAST MEDICAL HISTORY :  Past Medical History  Diagnosis Date  . Cancer ([place])     Lung CA- Bilateral  . Lung cancer ([place]) 07/16/2015    PAST SURGICAL HISTORY :  No past surgical history on file. Lobectomy left lower lobe/sarcoma  resection.   FAMILY HISTORY :  No family history on file. Family history of cancer/lung  SOCIAL HISTORY:  Currently not smoking. Social History  Substance Use Topics  . Smoking status: Former Research scientist (life sciences)  . Smokeless tobacco: Not on file  . Alcohol Use: Not on file    ALLERGIES:  has No Known Allergies.  MEDICATIONS:  Current Outpatient Prescriptions  Medication Sig Dispense Refill  . albuterol (PROAIR HFA) 108 (90 Base) MCG/ACT inhaler Inhale 1-2 puffs into the lungs every 6 (six) hours as needed for wheezing or shortness of breath. 1 Inhaler 6  . aspirin 81 MG chewable tablet Chew by mouth.    Marland Kitchen b complex vitamins capsule Take by mouth.    . Cholecalciferol (VITAMIN D-3 PO) Take by mouth.    . Multiple Vitamin (MULTI-VITAMINS) TABS Take by mouth.    . Omega-3 Fatty Acids (FISH OIL CONCENTRATE) 300 MG CAPS Take by mouth.     No current facility-administered medications for this visit.    PHYSICAL EXAMINATION: ECOG PERFORMANCE STATUS: 0 - Asymptomatic  BP 158/79 mmHg  Pulse 66  Temp(Src) 97.2 F (36.2 C) (Tympanic)  Resp 18  Wt 233 lb 4 oz (105.802 kg)  SpO2   Filed Weights   07/08/16 1126  Weight: 233 lb 4 oz (105.802 kg)    GENERAL: Well-nourished well-developed; Alert, no distress and comfortable.   Accompanied by his wife. EYES: no pallor or icterus OROPHARYNX: no thrush or ulceration; good dentition  NECK: supple, no  masses felt LYMPH:  no palpable lymphadenopathy in the cervical, axillary or inguinal regions LUNGS: clear to auscultation and  No wheeze or crackles HEART/CVS: regular rate & rhythm and no murmurs; No lower extremity edema ABDOMEN:abdomen soft, non-tender and normal bowel sounds Musculoskeletal:no cyanosis of digits and no clubbing  PSYCH: alert & oriented x 3 with fluent speech NEURO: no focal motor/sensory deficits SKIN:  no rashes or significant lesions  LABORATORY DATA:  I have reviewed the data as listed    Component Value Date/Time   NA  133* 07/08/2016 1057   NA 138 01/03/2015 0942   K 4.2 07/08/2016 1057   K 4.8 01/03/2015 0942   CL 101 07/08/2016 1057   CL 100 01/03/2015 0942   CO2 24 07/08/2016 1057   CO2 30 01/03/2015 0942   GLUCOSE 120* 07/08/2016 1057   GLUCOSE 117* 01/03/2015 0942   BUN 18 07/08/2016 1057   BUN 14 01/03/2015 0942   CREATININE 0.98 07/08/2016 1057   CREATININE 1.05 01/03/2015 0942   CALCIUM 8.9 07/08/2016 1057   CALCIUM 9.2 01/03/2015 0942   PROT 7.6 07/08/2016 1057   PROT 7.8 01/03/2015 0942   ALBUMIN 3.9 07/08/2016 1057   ALBUMIN 4.0 01/03/2015 0942   AST 20 07/08/2016 1057   AST 24 01/03/2015 0942   ALT 15* 07/08/2016 1057   ALT 34 01/03/2015 0942   ALKPHOS 64 07/08/2016 1057   ALKPHOS 73 01/03/2015 0942   BILITOT 0.7 07/08/2016 1057   BILITOT 0.6 01/03/2015 0942   GFRNONAA >60 07/08/2016 1057   GFRNONAA >60 01/03/2015 0942   GFRNONAA >60 06/29/2014 0947   GFRAA >60 07/08/2016 1057   GFRAA >60 01/03/2015 0942   GFRAA >60 06/29/2014 0947    No results found for: SPEP, UPEP  Lab Results  Component Value Date   WBC 4.9 07/08/2016   NEUTROABS 3.0 07/08/2016   HGB 16.2 07/08/2016   HCT 46.9 07/08/2016   MCV 85.0 07/08/2016   PLT 215 07/08/2016      Chemistry      Component Value Date/Time   NA 133* 07/08/2016 1057   NA 138 01/03/2015 0942   K 4.2 07/08/2016 1057   K 4.8 01/03/2015 0942   CL 101 07/08/2016 1057   CL 100 01/03/2015 0942   CO2 24 07/08/2016 1057   CO2 30 01/03/2015 0942   BUN 18 07/08/2016 1057   BUN 14 01/03/2015 0942   CREATININE 0.98 07/08/2016 1057   CREATININE 1.05 01/03/2015 0942      Component Value Date/Time   CALCIUM 8.9 07/08/2016 1057   CALCIUM 9.2 01/03/2015 0942   ALKPHOS 64 07/08/2016 1057   ALKPHOS 73 01/03/2015 0942   AST 20 07/08/2016 1057   AST 24 01/03/2015 0942   ALT 15* 07/08/2016 1057   ALT 34 01/03/2015 0942   BILITOT 0.7 07/08/2016 1057   BILITOT 0.6 01/03/2015 0942       RADIOGRAPHIC STUDIES: I have personally  reviewed the radiological images as listed and agreed with the findings in the report. No results found.   ASSESSMENT & PLAN:  Malignant neoplasm of upper lobe, left bronchus or lung ([place]) # Left upper lobe stage I adenocarcinoma lung- status post resection. Clinically no evidence of recurrence noted.   # Sarcoma of the thigh- metastatic to the lung status post resection. Clinically no evidence of recurrence.   # Shortness of breath question COPD versus others - given his multiple malignancies high-risk for recurrence. Recommend CT noncontrast of the chest for  further evaluation.  # Recommend follow-up in 6 months; again with a CT scan prior-this will need to be ordered. Labs normal.  # 25 minutes face-to-face with the patient discussing the above plan of care; more than 50% of time spent on prognosis/ natural history; counseling and coordination.    Orders Placed This Encounter  Procedures  . CT CHEST WO CONTRAST    Standing Status: Future     Number of Occurrences:      Standing Expiration Date: 09/07/2017    Order Specific Question:  Reason for Exam (SYMPTOM  OR DIAGNOSIS REQUIRED)    Answer:  lung cancer; sarcoma    Order Specific Question:  Preferred imaging location?    Answer:  [facility]   All questions were answered. The patient knows to call the clinic with any problems, questions or concerns.      Cammie Sickle, MD 07/08/2016 5:13 PM

## 2016-07-18 ENCOUNTER — Ambulatory Visit
Admission: RE | Admit: 2016-07-18 | Discharge: 2016-07-18 | Disposition: A | Discharge status: Home / Self Care | Payer: Medicare Other | Source: Ambulatory Visit | Attending: Internal Medicine | Admitting: Internal Medicine

## 2016-07-18 ENCOUNTER — Telehealth: Payer: Self-pay | Admitting: Internal Medicine

## 2016-07-18 ENCOUNTER — Other Ambulatory Visit: Payer: Self-pay | Admitting: Internal Medicine

## 2016-07-18 DIAGNOSIS — R911 Solitary pulmonary nodule: Secondary | ICD-10-CM | POA: Diagnosis not present

## 2016-07-18 DIAGNOSIS — R590 Localized enlarged lymph nodes: Secondary | ICD-10-CM | POA: Diagnosis not present

## 2016-07-18 DIAGNOSIS — C3412 Malignant neoplasm of upper lobe, left bronchus or lung: Secondary | ICD-10-CM | POA: Insufficient documentation

## 2016-07-18 NOTE — Telephone Encounter (Signed)
Review the CAT scan results with the patient's wife; recommend PET scan; bronchoscopy with pulmonary for biopsy/left hilar lesion.   Heather- please schedule the PET; follow up with me in 2 weeks.  Shawn- please set up with pulmonary at the earliest.   Thx

## 2016-07-18 NOTE — Telephone Encounter (Signed)
msg sent to [facility]. To arrange the pet scan asap and md f/u apt after pet in 2 weeks

## 2016-07-24 ENCOUNTER — Ambulatory Visit
Admission: RE | Admit: 2016-07-24 | Discharge: 2016-07-24 | Disposition: A | Discharge status: Home / Self Care | Payer: Medicare Other | Source: Ambulatory Visit | Attending: Internal Medicine | Admitting: Internal Medicine

## 2016-07-24 DIAGNOSIS — C3412 Malignant neoplasm of upper lobe, left bronchus or lung: Secondary | ICD-10-CM

## 2016-07-24 DIAGNOSIS — N210 Calculus in bladder: Secondary | ICD-10-CM | POA: Insufficient documentation

## 2016-07-24 DIAGNOSIS — R918 Other nonspecific abnormal finding of lung field: Secondary | ICD-10-CM | POA: Diagnosis not present

## 2016-07-24 LAB — GLUCOSE, CAPILLARY: GLUCOSE-CAPILLARY: 118 mg/dL — AB (ref 65–99)

## 2016-07-24 MED ORDER — FLUDEOXYGLUCOSE F - 18 (FDG) INJECTION
12.0000 | Freq: Once | INTRAVENOUS | Status: AC | PRN
  Administered 2016-07-24: 11.7 via INTRAVENOUS

## 2016-07-25 ENCOUNTER — Ambulatory Visit (INDEPENDENT_AMBULATORY_CARE_PROVIDER_SITE_OTHER): Payer: Medicare Other | Admitting: Internal Medicine

## 2016-07-25 ENCOUNTER — Encounter: Payer: Self-pay | Admitting: Internal Medicine

## 2016-07-25 VITALS — BP 126/76 | HR 69 | Ht 74.0 in | Wt 231.0 lb

## 2016-07-25 DIAGNOSIS — R599 Enlarged lymph nodes, unspecified: Secondary | ICD-10-CM | POA: Diagnosis not present

## 2016-07-25 DIAGNOSIS — R0609 Other forms of dyspnea: Secondary | ICD-10-CM

## 2016-07-25 DIAGNOSIS — C3432 Malignant neoplasm of lower lobe, left bronchus or lung: Secondary | ICD-10-CM

## 2016-07-25 DIAGNOSIS — R590 Localized enlarged lymph nodes: Secondary | ICD-10-CM | POA: Insufficient documentation

## 2016-07-25 DIAGNOSIS — C3412 Malignant neoplasm of upper lobe, left bronchus or lung: Secondary | ICD-10-CM

## 2016-07-25 NOTE — Progress Notes (Signed)
[facility] Consultation    Date: 07/25/2016  MRN# [id number] EMMETTE KATT June 12, 1941  Referring Physician: Dr. Rogue Bussing PMD -  TAJE LITTLER is a 75 y.o. old male seen in consultation for sob, left hilar enlargement.   CC:  Chief Complaint  Patient presents with  . Advice Only    ref by Dr. 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 Patient is a former smoker, quit a number of years ago. He states that he has some shortness of breath with exertion only, mild nonproductive cough which is getting better now, This is been occurring for the past 1-2 months. States that he has been using albuterol nightly for the past 2-3 weeks, has not seen any significant change in his breathing status. States that shortness of breath is not impairing his ADLs. He denies any fever, night sweats, chills.   PMHX:   Past Medical History:  Diagnosis Date  . Cancer ([place])    Lung CA- Bilateral  . Lung cancer ([place]) 07/16/2015   Surgical Hx:  History reviewed. No pertinent surgical history. Family Hx:  History reviewed. No pertinent family history. Social Hx:   Social History  Substance Use Topics  . Smoking status: Former Research scientist (life sciences)  . Smokeless tobacco: Never Used  . Alcohol use Not on file     Allergies:  Review of patient's allergies indicates no known allergies.  Review of Systems  Constitutional: Negative for chills, diaphoresis and fever.  Eyes: Negative for blurred vision.  Respiratory: Positive for shortness of breath. Negative for cough, hemoptysis, sputum production and wheezing.   Gastrointestinal: Negative for heartburn.  Genitourinary:  Negative for dysuria.  Musculoskeletal: Negative for myalgias.  Skin: Negative for rash.  Neurological: Negative for weakness and headaches.  Endo/Heme/Allergies: Does not bruise/bleed easily.  Psychiatric/Behavioral: Negative for depression.     Physical Examination:   VS: BP 126/76 (BP Location: Right Arm, Cuff Size: Normal)   Pulse 69   Ht '6\' 2"'$  (1.88 m)   Wt 231 lb (104.8 kg)   SpO2 98%   BMI 29.66 kg/m   General Appearance: No distress  Neuro:without focal findings, mental status, speech normal, alert and oriented, cranial nerves 2-12 intact, reflexes normal and symmetric, sensation grossly normal  HEENT: PERRLA, EOM intact, no ptosis, no other lesions noticed; Mallampati 3 Pulmonary: normal breath sounds., diaphragmatic excursion normal.No wheezing, No rales;   Sputum Production:  none CardiovascularNormal S1,S2.  No m/r/g.  Abdominal aorta pulsation normal.    Abdomen: Benign, Soft, non-tender, No masses, hepatosplenomegaly, No lymphadenopathy Renal:  No costovertebral tenderness  GU:  No performed at this time. Endoc: No evident thyromegaly, no signs of acromegaly or Cushing features Skin:   warm, no rashes, no ecchymosis  Extremities: normal, no cyanosis, clubbing, no edema, warm with normal capillary refill. Other findings:none   Labs results:   Rad results: (The following images and results were reviewed by Dr. Stevenson Clinch on 07/25/2016). CT CHest 07/18/16 CT CHEST WITHOUT CONTRAST TECHNIQUE: Multidetector CT imaging of the chest was performed following the standard protocol without IV contrast. COMPARISON:  CT 01/05/2016, PET-CT 06/28/2014 FINDINGS: Cardiovascular: Coronary artery calcification and aortic atherosclerotic calcification. No pericardial fluid Mediastinum/Nodes: No axillary supraclavicular  lymphadenopathy. LEFT AP window / LEFT hilar lymph node is increased in size measuring 31 x 33 mm compared to 27 by 25 mm. Adjacent perihilar lymph node measuring 19 mm  on image 29, series 2 Lungs/Pleura: Lobular pleural thickening in the posterior superior RIGHT lower lobe measures 52 by 18 mm compared to 45 by 15 mm. There is volume loss in the LEFT hemi thorax postsurgical change along the LEFT chest wall. No discrete pulmonary parenchymal nodularity. Upper Abdomen: Limited view of the liver, kidneys, pancreas are unremarkable. Normal adrenal glands. Musculoskeletal: No aggressive osseous lesion. IMPRESSION: 1. Evidence of lung cancer recurrence with enlarged LEFT mediastinal / hilar lymph nodes. 2. Nodular pleural parenchymal thickening in the superior segment RIGHT lower lobe is similar to comparison exams but measure slightly larger. Consider FDG PET CT scan for restaging.     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 I have discussed this case in thoracic oncology conference and the plan is to continue with EBUS and biopsy.   Plan: - EBUS of Left hilar node   Malignant neoplasm of upper lobe, left bronchus or lung ([place]) Patient with a known history of stage I adenocarcinoma. Now with left hilar enlargement with significant hypermetabolic activity on PET scan.  Plan: -Possible recurrence of adenocarcinoma, will plan for EBUS of Left hilar node  Lung cancer See plan for malignant neoplasm of left hilum. There is a right lower lobe superior segment peripheral lesion abutting the chest wall, this seems to be some type of fat globule, and no workup is needed for this lesion.  This patient case was discussed in thoracic oncology conference , with the following  recommendations: 1. Bronchoscopy, ultrasound, of left hilum 2. Follow-up with oncology after bronchoscopy   DOE (dyspnea on exertion) I suspected the left hilar adenopathy is causing significant bronchospasms. He is a former smoker, COPD is a possibility for his dyspnea with exertion. Will plan for pulmonary function testing and 6 to walk test prior to follow-up visit   Updated Medication List Outpatient Encounter Prescriptions as of 07/25/2016  Medication Sig  . albuterol (PROAIR HFA) 108 (90 Base) MCG/ACT inhaler Inhale 1-2 puffs into the lungs every 6 (six) hours as needed for wheezing or shortness of breath.  Marland Kitchen aspirin 325 MG tablet Take 325 mg by mouth daily.  Marland Kitchen b complex vitamins capsule Take by mouth.  . Cholecalciferol (VITAMIN D-3 PO) Take by mouth.  . Multiple Vitamin (MULTI-VITAMINS) TABS Take by mouth.  . Omega-3 Fatty Acids (FISH OIL CONCENTRATE) 300 MG CAPS Take by mouth.  . [DISCONTINUED] aspirin 81 MG chewable tablet Chew by mouth.   No facility-administered encounter medications on file as of 07/25/2016.     Orders for this visit: Orders Placed This Encounter  Procedures  . Pulmonary function test    Standing Status:   Future    Standing Expiration Date:   07/25/2017    Order Specific Question:   Where should this test be performed?    Answer:   [facility]    Order Specific Question:   MIP/MEP    Answer:   No  Order Specific Question:   6 minute walk    Answer:   Yes    Order Specific Question:   ABG    Answer:   No    Order Specific Question:   Diffusion capacity (DLCO)    Answer:   Yes    Order Specific Question:   Lung volumes    Answer:   Yes    Order Specific Question:   Methacholine challenge    Answer:   No  . 6 minute walk    Standing Status:   Future    Standing Expiration Date:   07/25/2017     Thank  you for the consultation and for allowing [facility] to assist in the care of your patient. Our recommendations are  noted above.  Please contact us if we can be of further service.   Vilinda Boehringer, MD [facility] Office Number: [phone]  Note: This note was prepared with Dragon dictation along with smaller phrase technology. Any transcriptional errors that result from this process are unintentional.

## 2016-07-25 NOTE — Patient Instructions (Signed)
Follow up with Dr. Stevenson Clinch in:2 months - We'll schedule a bronchoscopy with biopsy.EBUS, Biopsy, Fluoro, lab corp - PRN use of albuterol - PFTs and 61mt prior to follow up visit.

## 2016-07-25 NOTE — Assessment & Plan Note (Addendum)
"  LEFT AP window / LEFT hilar lymph node is increased in size measuring 31 x 33 mm compared to 27 by 25 mm." This finding is concerning for resurgence of cancer, possibly adenocarcinoma again. I have discussed this case in thoracic oncology conference and the plan is to continue with EBUS and biopsy.   Plan: - EBUS of Left hilar node

## 2016-07-25 NOTE — Assessment & Plan Note (Signed)
I suspected the left hilar adenopathy is causing significant bronchospasms. He is a former smoker, COPD is a possibility for his dyspnea with exertion. Will plan for pulmonary function testing and 6 to walk test prior to follow-up visit

## 2016-07-25 NOTE — Assessment & Plan Note (Signed)
Patient with a known history of stage I adenocarcinoma. Now with left hilar enlargement with significant hypermetabolic activity on PET scan.  Plan: -Possible recurrence of adenocarcinoma, will plan for EBUS of Left hilar node

## 2016-07-25 NOTE — Assessment & Plan Note (Addendum)
See plan for malignant neoplasm of left hilum. There is a right lower lobe superior segment peripheral lesion abutting the chest wall, this seems to be some type of fat globule, and no workup is needed for this lesion.  This patient case was discussed in thoracic oncology conference , with the following recommendations: 1. Bronchoscopy, ultrasound, of left hilum 2. Follow-up with oncology after bronchoscopy

## 2016-07-30 ENCOUNTER — Encounter
Admission: RE | Admit: 2016-07-30 | Discharge: 2016-07-30 | Disposition: A | Discharge status: Home / Self Care | Payer: Medicare Other | Source: Ambulatory Visit | Attending: Internal Medicine | Admitting: Internal Medicine

## 2016-07-30 DIAGNOSIS — Z01818 Encounter for other preprocedural examination: Secondary | ICD-10-CM | POA: Diagnosis present

## 2016-07-30 HISTORY — DX: Acute myocardial infarction, unspecified: I21.9

## 2016-07-30 HISTORY — DX: Cerebral infarction, unspecified: I63.9

## 2016-07-30 HISTORY — DX: Reserved for inherently not codable concepts without codable children: IMO0001

## 2016-07-30 HISTORY — DX: Atherosclerotic heart disease of native coronary artery without angina pectoris: I25.10

## 2016-07-30 NOTE — Patient Instructions (Signed)
  Your procedure is scheduled on: 08/08/16 Thurs Report to Same Day Surgery 2nd floor medical mall To find out your arrival time please call [phone] between 1PM - 3PM on 08/07/16 Wed  Remember: Instructions that are not followed completely may result in serious medical risk, up to and including death, or upon the discretion of your surgeon and anesthesiologist your surgery may need to be rescheduled.    _x___ 1. Do not eat food or drink liquids after midnight. No gum chewing or hard candies.     __x__ 2. No Alcohol for 24 hours before or after surgery.   __x__3. No Smoking for 24 prior to surgery.   ____  4. Bring all medications with you on the day of surgery if instructed.    __x__ 5. Notify your doctor if there is any change in your medical condition     (cold, fever, infections).     Do not wear jewelry, make-up, hairpins, clips or nail polish.  Do not wear lotions, powders, or perfumes. You may wear deodorant.  Do not shave 48 hours prior to surgery. Men may shave face and neck.  Do not bring valuables to the hospital.    [facility] is not responsible for any belongings or valuables.               Contacts, dentures or bridgework may not be worn into surgery.  Leave your suitcase in the car. After surgery it may be brought to your room.  For patients admitted to the hospital, discharge time is determined by your treatment team.   Patients discharged the day of surgery will not be allowed to drive home.    Please read over the following fact sheets that you were given:   [facility] Preparing for Surgery and or MRSA Information   _x___ Take these medicines the morning of surgery with A SIP OF WATER:    1. albuterol (PROAIR HFA) 108 (90 Base) MCG/ACT inhaler  2.  3.  4.  5.  6.  ____ Fleet Enema (as directed)   _x___ Use CHG Soap or sage wipes as directed on instruction sheet   ____ Use inhalers on the day of surgery and bring to hospital day of surgery  ____  Stop metformin 2 days prior to surgery    ____ Take 1/2 of usual insulin dose the night before surgery and none on the morning of           surgery.   __x__ Stop aspirin or coumadin, or plavix Stop aspirin 1 week before surgery  _x__ Stop Anti-inflammatories such as Advil, Aleve, Ibuprofen, Motrin, Naproxen,          Naprosyn, Goodies powders or aspirin products. Ok to take Tylenol.   __x__ Stop supplements until after surgery.  Stop fish oil 1 week before surgery  ____ Bring C-Pap to the hospital.

## 2016-07-30 NOTE — Pre-Procedure Instructions (Signed)
Component Value Ref Range  Vent Rate (bpm) 52   PR Interval (msec) 156   QRS Interval (msec) 82   QT Interval (msec) 430   QTc (msec) 399    ECG 12-lead (05/24/2016 9:23 AM)  Specimen Performing Laboratory   [facility] GE MUSE RESULTS    ECG 12-lead (05/24/2016 9:23 AM)  Narrative  Sinus bradycardia with premature atrial complexes  Nonspecific T wave abnormalities  Abnormal ECG      I reviewed and concur with this report. Electronically signed DJ:TTSV MD, KEN [phone]) on 06/03/2016 4:46:46 PM     Visit Diagnoses - in this encounter

## 2016-08-08 ENCOUNTER — Encounter
Admission: RE | Disposition: A | Discharge status: Home / Self Care | Payer: Self-pay | Source: Ambulatory Visit | Attending: Internal Medicine

## 2016-08-08 ENCOUNTER — Ambulatory Visit
Admission: RE | Admit: 2016-08-08 | Discharge: 2016-08-08 | Disposition: A | Discharge status: Home / Self Care | Payer: Medicare Other | Source: Ambulatory Visit | Attending: Internal Medicine | Admitting: Internal Medicine

## 2016-08-08 ENCOUNTER — Inpatient Hospital Stay: Payer: Medicare Other | Admitting: Internal Medicine

## 2016-08-08 ENCOUNTER — Ambulatory Visit: Payer: Medicare Other | Admitting: Anesthesiology

## 2016-08-08 DIAGNOSIS — R918 Other nonspecific abnormal finding of lung field: Secondary | ICD-10-CM

## 2016-08-08 DIAGNOSIS — C7802 Secondary malignant neoplasm of left lung: Secondary | ICD-10-CM | POA: Insufficient documentation

## 2016-08-08 DIAGNOSIS — Z87891 Personal history of nicotine dependence: Secondary | ICD-10-CM | POA: Diagnosis not present

## 2016-08-08 DIAGNOSIS — E669 Obesity, unspecified: Secondary | ICD-10-CM | POA: Insufficient documentation

## 2016-08-08 DIAGNOSIS — Z85118 Personal history of other malignant neoplasm of bronchus and lung: Secondary | ICD-10-CM | POA: Insufficient documentation

## 2016-08-08 DIAGNOSIS — R222 Localized swelling, mass and lump, trunk: Secondary | ICD-10-CM | POA: Diagnosis not present

## 2016-08-08 DIAGNOSIS — Z6829 Body mass index (BMI) 29.0-29.9, adult: Secondary | ICD-10-CM | POA: Diagnosis not present

## 2016-08-08 DIAGNOSIS — I252 Old myocardial infarction: Secondary | ICD-10-CM | POA: Diagnosis not present

## 2016-08-08 DIAGNOSIS — R590 Localized enlarged lymph nodes: Secondary | ICD-10-CM | POA: Diagnosis present

## 2016-08-08 DIAGNOSIS — Z85831 Personal history of malignant neoplasm of soft tissue: Secondary | ICD-10-CM | POA: Diagnosis not present

## 2016-08-08 DIAGNOSIS — Z7982 Long term (current) use of aspirin: Secondary | ICD-10-CM | POA: Insufficient documentation

## 2016-08-08 DIAGNOSIS — I2510 Atherosclerotic heart disease of native coronary artery without angina pectoris: Secondary | ICD-10-CM | POA: Insufficient documentation

## 2016-08-08 DIAGNOSIS — Z79899 Other long term (current) drug therapy: Secondary | ICD-10-CM | POA: Insufficient documentation

## 2016-08-08 DIAGNOSIS — Z955 Presence of coronary angioplasty implant and graft: Secondary | ICD-10-CM | POA: Insufficient documentation

## 2016-08-08 HISTORY — PX: ENDOBRONCHIAL ULTRASOUND: SHX5096

## 2016-08-08 SURGERY — ENDOBRONCHIAL ULTRASOUND (EBUS)
Anesthesia: General

## 2016-08-08 MED ORDER — EPHEDRINE SULFATE 50 MG/ML IJ SOLN
INTRAMUSCULAR | Status: DC | PRN
  Administered 2016-08-08: 10 mg via INTRAVENOUS

## 2016-08-08 MED ORDER — SUCCINYLCHOLINE CHLORIDE 20 MG/ML IJ SOLN
INTRAMUSCULAR | Status: DC | PRN
  Administered 2016-08-08: 120 mg via INTRAVENOUS

## 2016-08-08 MED ORDER — PROPOFOL 10 MG/ML IV BOLUS
INTRAVENOUS | Status: DC | PRN
  Administered 2016-08-08: 20 mg via INTRAVENOUS
  Administered 2016-08-08: 150 mg via INTRAVENOUS

## 2016-08-08 MED ORDER — FAMOTIDINE 20 MG PO TABS
20.0000 mg | ORAL_TABLET | Freq: Once | ORAL | Status: AC
  Administered 2016-08-08: 20 mg via ORAL

## 2016-08-08 MED ORDER — ROCURONIUM BROMIDE 100 MG/10ML IV SOLN: INTRAVENOUS | Status: DC | PRN

## 2016-08-08 MED ORDER — FAMOTIDINE 20 MG PO TABS
ORAL_TABLET | ORAL | Status: AC
  Administered 2016-08-08: 20 mg via ORAL
  Filled 2016-08-08: qty 1

## 2016-08-08 MED ORDER — MIDAZOLAM HCL 2 MG/2ML IJ SOLN
INTRAMUSCULAR | Status: DC | PRN
  Administered 2016-08-08: 2 mg via INTRAVENOUS

## 2016-08-08 MED ORDER — LACTATED RINGERS IV SOLN
INTRAVENOUS | Status: DC
  Administered 2016-08-08 (×2): via INTRAVENOUS

## 2016-08-08 MED ORDER — ONDANSETRON HCL 4 MG/2ML IJ SOLN: 4.0000 mg | Freq: Once | INTRAMUSCULAR | Status: DC | PRN

## 2016-08-08 MED ORDER — FENTANYL CITRATE (PF) 100 MCG/2ML IJ SOLN
INTRAMUSCULAR | Status: DC | PRN
  Administered 2016-08-08 (×2): 50 ug via INTRAVENOUS

## 2016-08-08 MED ORDER — PHENYLEPHRINE HCL 10 MG/ML IJ SOLN
INTRAMUSCULAR | Status: DC | PRN
  Administered 2016-08-08 (×3): 100 ug via INTRAVENOUS

## 2016-08-08 MED ORDER — SUGAMMADEX SODIUM 200 MG/2ML IV SOLN
INTRAVENOUS | Status: DC | PRN
  Administered 2016-08-08: 209.6 mg via INTRAVENOUS

## 2016-08-08 MED ORDER — SODIUM CHLORIDE 0.9 % IV SOLN: INTRAVENOUS | Status: DC | PRN

## 2016-08-08 MED ORDER — ROCURONIUM BROMIDE 100 MG/10ML IV SOLN
INTRAVENOUS | Status: DC | PRN
  Administered 2016-08-08: 20 mg via INTRAVENOUS

## 2016-08-08 MED ORDER — LIDOCAINE HCL (CARDIAC) 20 MG/ML IV SOLN
INTRAVENOUS | Status: DC | PRN
  Administered 2016-08-08: 40 mg via INTRAVENOUS

## 2016-08-08 MED ORDER — PROPOFOL 500 MG/50ML IV EMUL
INTRAVENOUS | Status: DC | PRN
  Administered 2016-08-08: 75 ug/kg/min via INTRAVENOUS

## 2016-08-08 MED ORDER — FENTANYL CITRATE (PF) 100 MCG/2ML IJ SOLN: 25.0000 ug | INTRAMUSCULAR | Status: DC | PRN

## 2016-08-08 NOTE — Transfer of Care (Signed)
Immediate Anesthesia Transfer of Care Note  Patient: Stephen [name]  Procedure(s) Performed: Procedure(s): ENDOBRONCHIAL ULTRASOUND (N/A)  Patient Location: PACU  Anesthesia Type:General  Level of Consciousness: awake  Airway & Oxygen Therapy: Patient Spontanous Breathing and Patient connected to face mask oxygen  Post-op Assessment: Report given to RN and Post -op Vital signs reviewed and stable  Post vital signs: Reviewed and stable  Last Vitals:  Vitals:   08/08/16 1431 08/08/16 1433  BP: (!) 127/58 (!) 127/58  Pulse: 67 73  Resp: 19 (!) 23  Temp: 36.3 C     Last Pain:  Vitals:   08/08/16 1433  TempSrc:   PainSc: Asleep         Complications: No apparent anesthesia complications

## 2016-08-08 NOTE — Discharge Instructions (Signed)
AMBULATORY SURGERY  °DISCHARGE INSTRUCTIONS ° ° °1) The drugs that you were given will stay in your system until tomorrow so for the next 24 hours you should not: ° °A) Drive an automobile °B) Make any legal decisions °C) Drink any alcoholic beverage ° ° °2) You may resume regular meals tomorrow.  Today it is better to start with liquids and gradually work up to solid foods. ° °You may eat anything you prefer, but it is better to start with liquids, then soup and crackers, and gradually work up to solid foods. ° ° °3) Please notify your doctor immediately if you have any unusual bleeding, trouble breathing, redness and pain at the surgery site, drainage, fever, or pain not relieved by medication. ° ° ° °4) Additional Instructions: ° ° ° ° ° ° ° °Please contact your physician with any problems or Same Day Surgery at [phone], Monday through Friday 6 am to 4 pm, or [place] [facility] [place] Main number at [phone]. °

## 2016-08-08 NOTE — Anesthesia Procedure Notes (Signed)
Procedure Name: Intubation Date/Time: 08/08/2016 1:30 PM Performed by: Allean Found Pre-anesthesia Checklist: Patient identified, Emergency Drugs available, Suction available, Patient being monitored and Timeout performed Patient Re-evaluated:Patient Re-evaluated prior to inductionOxygen Delivery Method: Circle system utilized Preoxygenation: Pre-oxygenation with 100% oxygen Intubation Type: IV induction Laryngoscope Size: Mac and 4 Grade View: Grade I Tube type: Oral Tube size: 8.0 mm Number of attempts: 1 Airway Equipment and Method: Stylet

## 2016-08-08 NOTE — Procedures (Signed)
Date: 08/08/2016, '@1422'$   PHYSICIAN:  Jaileen Janelle  Indications/Preliminary Diagnosis: Left Hilar adenopathy/mass  Consent: (Place X beside choice/s below)  The benefits, risks and possible complications of the procedure were        explained to:  _x__ patient  _x__ patient's family  ___ other:___________  who verbalized understanding and gave:  ___ verbal  ___ written  __x_ verbal and written  ___ telephone  ___ other:________ consent.      Unable to obtain consent; procedure performed on emergent basis.     Other:       PRESEDATION ASSESSMENT: History and Physical has been performed. Patient meds and allergies have been reviewed. Presedation airway examination has been performed and documented. Baseline vital signs, sedation score, oxygenation status, and cardiac rhythm were reviewed. Patient was deemed to be in satisfactory condition to undergo the procedure.  PREMEDICATIONS:   Sedative/Narcotic Amt Dose   Versed  mg   Fentanyl  mcg  Diprivan  mg        Airway Prep (Place X beside choice below)   1% Transtracheal Lidocaine Anesthetization 7 cc   Patient prepped per Bronchoscopy Lab Policy       Insertion Route (Place X beside choice below)   Nasal   Oral   Endotracheal Tube   Tracheostomy   INTRAPROCEDURE MEDICATIONS:  Sedative/Narcotic Amt Dose   Versed  mg   Fentanyl  mcg  Diprivan  mg       Medication Amt Dose  Medication Amt Dose  Xylocaine 2%  cc  Epinephrine 1:10,000 sol  cc  Xylocaine 4%  cc  Cocaine  cc   TECHNICAL PROCEDURES: (Place X beside choice below)   Procedures  Description    None     Electrocautery     Cryotherapy     Balloon Dilatation     Bronchography     Stent Placement   x  Therapeutic Aspiration     Laser/Argon Plasma    Brachytherapy Catheter Placement    Foreign Body Removal     SPECIMENS (Sites): (Place X beside choice below)  Specimens Description   No Specimens Obtained     Washings    Lavage    Biopsies   x Fine  Needle Aspirates EBUS FNA x 4 left hilar node   Brushings    Sputum    FINDINGS: see below  ESTIMATED BLOOD LOSS: <1HE  COMPLICATIONS/RESOLUTION: none  PROCEDURE DETAILS: Timeout performed and correct patient, name, & ID confirmed. Following prep per Pulmonary policy, appropriate sedation was administered.  Airway exam proceeded with findings, technical procedures, and specimen collection as noted below. At the end of exam the scope was withdrawn without incident. Impression and Plan as noted below.   Inspection:Right airway, mild inflammation of the RMS mucosa, no endobronchial lesion. RUL stump clean.  Left Airway - mild inflammation of the LMS to the LLL mucosa, no endobronchial lesion. LUL orifice/stump with moderate erythema and inflammation, no endobronchial lesions.   After inspection the FOB scope was removed and the EBUS was inserted, under U/S guidance the left hilar node/mass was located and FNA x 4 was performed under U/S guidance  Procedure: FNA L hilar mass x 4  IMPLANTED DEVICE(S):none  IMPRESSION:POST-PROCEDURE DX: malignancy  RECOMMENDATION/PLAN:  Follow up results with onocology    ADDITIONAL COMMENTS:none   Procedure Time:35 minutes   Vilinda Boehringer, MD [facility] Pager : [phone] (Please enter 7 digits) On call pager [phone] (please  enter 7-digits) Clinic - [phone]

## 2016-08-08 NOTE — H&P (Signed)
Patient seen and examined, H&P per clinic note on 8/3. No significant changes. Images reviewed, and procedure(s) planned accordingly.  A: 75 year old with left hilar mass/nodal enlargement  P: - EBUS bronchoscopy fo L hilar node/mass with biospy    I, the ordering provider, attest that I have discussed with the patient the benefits, risks, side effects, alternatives, likelihood of achieving goals and potential problems during recovery for the procedure that I have provided informed consent.   Vilinda Boehringer, MD [facility] Pager [phone] (please enter 7-digits) On Call Pager - [phone] (please enter 7-digits) Clinic - [phone]

## 2016-08-08 NOTE — Anesthesia Preprocedure Evaluation (Signed)
Anesthesia Evaluation  Patient identified by MRN, date of birth, ID band Patient awake    Reviewed: Allergy & Precautions, NPO status , Patient's Chart, lab work & pertinent test results  Airway Mallampati: II       Dental  (+) Upper Dentures, Lower Dentures   Pulmonary shortness of breath and with exertion, former smoker,     + decreased breath sounds      Cardiovascular + CAD, + Past MI, + Cardiac Stents and + DOE   Rhythm:Regular     Neuro/Psych    GI/Hepatic negative GI ROS, Neg liver ROS,   Endo/Other  negative endocrine ROS  Renal/GU negative Renal ROS     Musculoskeletal   Abdominal (+) + obese,   Peds  Hematology negative hematology ROS (+)   Anesthesia Other Findings   Reproductive/Obstetrics                             Anesthesia Physical Anesthesia Plan  ASA: III  Anesthesia Plan: General   Post-op Pain Management:    Induction: Intravenous  Airway Management Planned: Oral ETT  Additional Equipment:   Intra-op Plan:   Post-operative Plan: Extubation in OR  Informed Consent: I have reviewed the patients History and Physical, chart, labs and discussed the procedure including the risks, benefits and alternatives for the proposed anesthesia with the patient or authorized representative who has indicated his/her understanding and acceptance.     Plan Discussed with: CRNA  Anesthesia Plan Comments:         Anesthesia Quick Evaluation

## 2016-08-09 ENCOUNTER — Encounter: Payer: Self-pay | Admitting: Internal Medicine

## 2016-08-09 NOTE — Anesthesia Postprocedure Evaluation (Signed)
Anesthesia Post Note  Patient: Stephen [name]  Procedure(s) Performed: Procedure(s) (LRB): ENDOBRONCHIAL ULTRASOUND (N/A)  Patient location during evaluation: PACU Anesthesia Type: General Level of consciousness: awake and alert Pain management: pain level controlled Vital Signs Assessment: post-procedure vital signs reviewed and stable Respiratory status: spontaneous breathing, nonlabored ventilation, respiratory function stable and patient connected to nasal cannula oxygen Cardiovascular status: blood pressure returned to baseline and stable Postop Assessment: no signs of nausea or vomiting Anesthetic complications: no    Last Vitals:  Vitals:   08/08/16 1522 08/08/16 1539  BP: 129/61 125/69  Pulse: (!) 59 66  Resp: 16 16  Temp:      Last Pain:  Vitals:   08/08/16 1522  TempSrc:   PainSc: 0-No pain                 Martha Clan

## 2016-08-12 LAB — CYTOLOGY - NON PAP

## 2016-08-22 ENCOUNTER — Ambulatory Visit
Admission: RE | Admit: 2016-08-22 | Discharge: 2016-08-22 | Disposition: A | Discharge status: Home / Self Care | Payer: Medicare Other | Source: Ambulatory Visit | Attending: Radiation Oncology | Admitting: Radiation Oncology

## 2016-08-22 ENCOUNTER — Institutional Professional Consult (permissible substitution): Payer: Medicare Other | Admitting: Radiation Oncology

## 2016-08-22 ENCOUNTER — Inpatient Hospital Stay: Payer: Medicare Other | Attending: Internal Medicine | Admitting: Internal Medicine

## 2016-08-22 VITALS — BP 134/79 | HR 68 | Temp 96.3°F | Resp 20 | Ht 74.0 in | Wt 228.6 lb

## 2016-08-22 DIAGNOSIS — Z87891 Personal history of nicotine dependence: Secondary | ICD-10-CM

## 2016-08-22 DIAGNOSIS — Z85828 Personal history of other malignant neoplasm of skin: Secondary | ICD-10-CM | POA: Insufficient documentation

## 2016-08-22 DIAGNOSIS — C3412 Malignant neoplasm of upper lobe, left bronchus or lung: Secondary | ICD-10-CM | POA: Diagnosis not present

## 2016-08-22 DIAGNOSIS — Z7982 Long term (current) use of aspirin: Secondary | ICD-10-CM | POA: Insufficient documentation

## 2016-08-22 DIAGNOSIS — I2510 Atherosclerotic heart disease of native coronary artery without angina pectoris: Secondary | ICD-10-CM | POA: Insufficient documentation

## 2016-08-22 DIAGNOSIS — Z79899 Other long term (current) drug therapy: Secondary | ICD-10-CM | POA: Insufficient documentation

## 2016-08-22 DIAGNOSIS — Z510 Encounter for antineoplastic radiation therapy: Secondary | ICD-10-CM | POA: Insufficient documentation

## 2016-08-22 DIAGNOSIS — R0602 Shortness of breath: Secondary | ICD-10-CM | POA: Insufficient documentation

## 2016-08-22 DIAGNOSIS — Z955 Presence of coronary angioplasty implant and graft: Secondary | ICD-10-CM | POA: Insufficient documentation

## 2016-08-22 DIAGNOSIS — I252 Old myocardial infarction: Secondary | ICD-10-CM | POA: Insufficient documentation

## 2016-08-22 DIAGNOSIS — Z8673 Personal history of transient ischemic attack (TIA), and cerebral infarction without residual deficits: Secondary | ICD-10-CM | POA: Insufficient documentation

## 2016-08-22 DIAGNOSIS — C4922 Malignant neoplasm of connective and soft tissue of left lower limb, including hip: Secondary | ICD-10-CM | POA: Insufficient documentation

## 2016-08-22 DIAGNOSIS — C7802 Secondary malignant neoplasm of left lung: Secondary | ICD-10-CM | POA: Insufficient documentation

## 2016-08-22 NOTE — Assessment & Plan Note (Addendum)
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 Recommend radiation. Patient meet with Dr. Donella Stade this afternoon. Also discussed at the tumor conference. Patient will need surveillance CT scan. Months after finishing radiation.  # Sarcoma of the thigh- metastatic to the lung status post resection.  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 Clinically no evidence of recurrence noted.    # Patient will follow-up in approximately 2 months/no labs.  # 25 minutes face-to-face with the patient discussing the above plan of care; more than 50% of time spent on prognosis/ natural history; counseling and coordination.

## 2016-08-22 NOTE — Progress Notes (Signed)
Stephen [name]  Patient Care Team: No Pcp Per Patient as PCP - General (General Practice)  No matching staging information was found for the patient.   Oncology History   #  Diagnosis of sarcoma.  Left thigh (at [facility]    neo adjuvant radiation  and surgery ,  In November of 2011.pathology report shows poorly differentiated sarcoma, high grade.  # DEC 2015- s/p Left Upper lobe s/p wedge resection- metastatic pleomorphic sarcoma; # nodule- Benign osteoid. [Dr.Oaks]  # April 2014-  S/p LL lobectomy- [place] ca Stage I   # AUG 2017- Left Hilar Mass Bx [Dr.kasa]; Recurrent sarcoma- Start RT  # 2007-  Carcinoma of lung adenocarcinoma. T2N0M0 tumor. Status-post right thoracotomy and upper lobectomy. Stage I-B.  #  3. 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     Lung cancer ([place])   07/16/2015 Initial Diagnosis    Lung cancer       Malignant neoplasm of upper lobe, left bronchus or lung ([place])      INTERVAL HISTORY:  Stephen [name] 75 y.o.  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   He has just returned back from his vacation in [place].  Patient denies any unusual shortness of breath. Denies any cough; no hemoptysis. He has not been using his inhaler. Otherwise denies any new lumps or bumps. Appetite is good. No weight loss.   REVIEW OF SYSTEMS:  A complete 10 point review of system is done which is negative except mentioned above/history of present illness.   PAST MEDICAL HISTORY :  Past Medical History:  Diagnosis Date  . Cancer ([place])    Lung CA- Bilateral  . Coronary artery disease   . Lung cancer ([place]) 07/16/2015  . Myocardial infarction [facility])    coronary stent  . Shortness of breath dyspnea   . Stroke  [facility])     PAST SURGICAL HISTORY :   Past Surgical History:  Procedure Laterality Date  . CAROTID ENDARTERECTOMY Right   . ENDOBRONCHIAL ULTRASOUND N/A 08/08/2016   Procedure: ENDOBRONCHIAL ULTRASOUND;  Surgeon: Vilinda Boehringer, MD;  Location: [facility] ORS;  Service: Cardiopulmonary;  Laterality: N/A;  . SKIN CANCER EXCISION Left   . THORACOTOMY Bilateral    Lobectomy left lower lobe/sarcoma resection.   FAMILY HISTORY :  No family history on file. Family history of cancer/lung  SOCIAL HISTORY:  Currently not smoking. Social History  Substance Use Topics  . Smoking status: Former Smoker    Quit date: 07/31/2003  . Smokeless tobacco: Never Used  . Alcohol use No    ALLERGIES:  has No Known Allergies.  MEDICATIONS:  Current Outpatient Prescriptions  Medication Sig Dispense Refill  . albuterol (PROAIR HFA) 108 (90 Base) MCG/ACT inhaler Inhale 1-2 puffs into the lungs every 6 (six) hours as needed for wheezing or shortness of breath. 1 Inhaler 6  . aspirin 325 MG tablet Take 325 mg by mouth at bedtime.     . Cholecalciferol (VITAMIN D-3 PO) Take 1 tablet by mouth daily.     . Doxylamine Succinate, Sleep, (SLEEP AID PO) Take 1 tablet by mouth at bedtime.    . Omega-3 Fatty Acids (FISH OIL) 1000 MG CAPS Take 1 capsule by mouth daily.  No current facility-administered medications for this visit.     PHYSICAL EXAMINATION: ECOG PERFORMANCE STATUS: 0 - Asymptomatic  BP 134/79 (Patient Position: Sitting)   Pulse 68   Temp (!) 96.3 F (35.7 C) (Tympanic)   Resp 20   Ht '6\' 2"'$  (1.88 m)   Wt 228 lb 9.6 oz (103.7 kg)   BMI 29.35 kg/m   Filed Weights   08/22/16 1338  Weight: 228 lb 9.6 oz (103.7 kg)    GENERAL: Well-nourished well-developed; Alert, no distress and comfortable.   Accompanied by his wife. EYES: no pallor or icterus OROPHARYNX: no thrush or ulceration; good dentition  NECK: supple, no masses felt LYMPH:  no palpable lymphadenopathy in the cervical, axillary or  inguinal regions LUNGS: clear to auscultation and  No wheeze or crackles HEART/CVS: regular rate & rhythm and no murmurs; No lower extremity edema ABDOMEN:abdomen soft, non-tender and normal bowel sounds Musculoskeletal:no cyanosis of digits and no clubbing  PSYCH: alert & oriented x 3 with fluent speech NEURO: no focal motor/sensory deficits SKIN:  no rashes or significant lesions  LABORATORY DATA:  I have reviewed the data as listed    Component Value Date/Time   NA 133 (L) 07/08/2016 1057   NA 138 01/03/2015 0942   K 4.2 07/08/2016 1057   K 4.8 01/03/2015 0942   CL 101 07/08/2016 1057   CL 100 01/03/2015 0942   CO2 24 07/08/2016 1057   CO2 30 01/03/2015 0942   GLUCOSE 120 (H) 07/08/2016 1057   GLUCOSE 117 (H) 01/03/2015 0942   BUN 18 07/08/2016 1057   BUN 14 01/03/2015 0942   CREATININE 0.98 07/08/2016 1057   CREATININE 1.05 01/03/2015 0942   CALCIUM 8.9 07/08/2016 1057   CALCIUM 9.2 01/03/2015 0942   PROT 7.6 07/08/2016 1057   PROT 7.8 01/03/2015 0942   ALBUMIN 3.9 07/08/2016 1057   ALBUMIN 4.0 01/03/2015 0942   AST 20 07/08/2016 1057   AST 24 01/03/2015 0942   ALT 15 (L) 07/08/2016 1057   ALT 34 01/03/2015 0942   ALKPHOS 64 07/08/2016 1057   ALKPHOS 73 01/03/2015 0942   BILITOT 0.7 07/08/2016 1057   BILITOT 0.6 01/03/2015 0942   GFRNONAA >60 07/08/2016 1057   GFRNONAA >60 01/03/2015 0942   GFRNONAA >60 06/29/2014 0947   GFRAA >60 07/08/2016 1057   GFRAA >60 01/03/2015 0942   GFRAA >60 06/29/2014 0947    No results found for: SPEP, UPEP  Lab Results  Component Value Date   WBC 4.9 07/08/2016   NEUTROABS 3.0 07/08/2016   HGB 16.2 07/08/2016   HCT 46.9 07/08/2016   MCV 85.0 07/08/2016   PLT 215 07/08/2016      Chemistry      Component Value Date/Time   NA 133 (L) 07/08/2016 1057   NA 138 01/03/2015 0942   K 4.2 07/08/2016 1057   K 4.8 01/03/2015 0942   CL 101 07/08/2016 1057   CL 100 01/03/2015 0942   CO2 24 07/08/2016 1057   CO2 30 01/03/2015  0942   BUN 18 07/08/2016 1057   BUN 14 01/03/2015 0942   CREATININE 0.98 07/08/2016 1057   CREATININE 1.05 01/03/2015 0942      Component Value Date/Time   CALCIUM 8.9 07/08/2016 1057   CALCIUM 9.2 01/03/2015 0942   ALKPHOS 64 07/08/2016 1057   ALKPHOS 73 01/03/2015 0942   AST 20 07/08/2016 1057   AST 24 01/03/2015 0942   ALT 15 (L) 07/08/2016 1057   ALT 34 01/03/2015  9122   BILITOT 0.7 07/08/2016 1057   BILITOT 0.6 01/03/2015 0942       RADIOGRAPHIC STUDIES: I have personally reviewed the radiological images as listed and agreed with the findings in the report. No results found.   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 Recommend radiation. Patient meet with Dr. Donella Stade this afternoon. Also discussed at the tumor conference. Patient will need surveillance CT scan. Months after finishing radiation.  # Sarcoma of the thigh- metastatic to the lung status post resection.  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 Clinically no evidence of recurrence noted.    # Patient will follow-up in approximately 2 months/no labs.  # 25 minutes face-to-face with the patient discussing the above plan of care; more than 50% of time spent on prognosis/ natural history; counseling and coordination.   No orders of the defined types were placed in this encounter.  All questions were answered. The patient knows to call the clinic with any problems, questions or concerns.      Cammie Sickle, MD 08/22/2016 4:36 PM

## 2016-08-22 NOTE — Consult Note (Signed)
Except an outstanding is perfect of Radiation Oncology NEW PATIENT EVALUATION  Name: Stephen [name]  MRN: [id number]  Date:   08/22/2016     DOB: 22-Apr-1941   This 75 y.o. male patient presents to the clinic for initial evaluation of left hilar high-grade pleomorphic sarcoma metastasis.  REFERRING PHYSICIAN: No ref. provider found  CHIEF COMPLAINT: No chief complaint on file.   DIAGNOSIS: The encounter diagnosis was Malignant neoplasm of bronchus of upper lobe, left ([place]).   PREVIOUS INVESTIGATIONS:  PET CT and CT scans reviewed Cytology report reviewed Clinical notes reviewed  HPI: Patient is a pleasant 75 year old male initially diagnosed in 2011 with a left thigh high-grade pleomorphic poorly differentiated sarcoma. He had radiation and resection and had done well until 2015 when he presented with a left upper lobe metastatic pleomorphic sarcoma which was wedge resected. He's also had a left lower lobectomy for stage I adenocarcinoma in 2014. Patient is also status post AAA repair November 2013. He is been having some mild shortness of breath and dyspnea on exertion and recent PET CT scan shows a solitary extremely hypermetabolic lesion in the left hilar region. Patient underwent endobronchial biopsy which again was cytology positive for metastatic poorly differentiated pleomorphic high-grade sarcoma. I been asked to evaluate the patient for possible palliative radiation therapy. He is doing fairly well. Again slight dyspnea on exertion no hemoptysis or significant cough. By mouth intake is good no dysphagia and weight has been stable.  PLANNED TREATMENT REGIMEN: SB RT to left lung  PAST MEDICAL HISTORY:  has a past medical history of Cancer ([place]); Coronary artery disease; Lung cancer ([place]) (07/16/2015); Myocardial infarction ([place]); Shortness of breath dyspnea; and Stroke ([place]).    PAST SURGICAL HISTORY:  Past Surgical History:  Procedure Laterality Date  . CAROTID ENDARTERECTOMY Right    . ENDOBRONCHIAL ULTRASOUND N/A 08/08/2016   Procedure: ENDOBRONCHIAL ULTRASOUND;  Surgeon: Vilinda Boehringer, MD;  Location: [facility] ORS;  Service: Cardiopulmonary;  Laterality: N/A;  . SKIN CANCER EXCISION Left   . THORACOTOMY Bilateral     FAMILY HISTORY: family history is not on file.  SOCIAL HISTORY:  reports that he quit smoking about 13 years ago. He has never used smokeless tobacco. He reports that he does not drink alcohol or use drugs.  ALLERGIES: Review of patient's allergies indicates no known allergies.  MEDICATIONS:  Current Outpatient Prescriptions  Medication Sig Dispense Refill  . albuterol (PROAIR HFA) 108 (90 Base) MCG/ACT inhaler Inhale 1-2 puffs into the lungs every 6 (six) hours as needed for wheezing or shortness of breath. 1 Inhaler 6  . aspirin 325 MG tablet Take 325 mg by mouth at bedtime.     . Cholecalciferol (VITAMIN D-3 PO) Take 1 tablet by mouth daily.     . Doxylamine Succinate, Sleep, (SLEEP AID PO) Take 1 tablet by mouth at bedtime.    . Omega-3 Fatty Acids (FISH OIL) 1000 MG CAPS Take 1 capsule by mouth daily.     No current facility-administered medications for this encounter.     ECOG PERFORMANCE STATUS:  0 - Asymptomatic  REVIEW OF SYSTEMS: Except for slight dyspnea on exertion and fatigue Patient denies any weight loss, fatigue, weakness, fever, chills or night sweats. Patient denies any loss of vision, blurred vision. Patient denies any ringing  of the ears or hearing loss. No irregular heartbeat. Patient denies heart murmur or history of fainting. Patient denies any chest pain or pain radiating to her upper extremities. Patient denies any shortness of  breath, difficulty breathing at night, cough or hemoptysis. Patient denies any swelling in the lower legs. Patient denies any nausea vomiting, vomiting of blood, or coffee ground material in the vomitus. Patient denies any stomach pain. Patient states has had normal bowel movements no significant constipation  or diarrhea. Patient denies any dysuria, hematuria or significant nocturia. Patient denies any problems walking, swelling in the joints or loss of balance. Patient denies any skin changes, loss of hair or loss of weight. Patient denies any excessive worrying or anxiety or significant depression. Patient denies any problems with insomnia. Patient denies excessive thirst, polyuria, polydipsia. Patient denies any swollen glands, patient denies easy bruising or easy bleeding. Patient denies any recent infections, allergies or URI. Patient "s visual fields have not changed significantly in recent time.    PHYSICAL EXAM: There were no vitals taken for this visit. Well-developed well-nourished patient in NAD. HEENT reveals PERLA, EOMI, discs not visualized.  Oral cavity is clear. No oral mucosal lesions are identified. Neck is clear without evidence of cervical or supraclavicular adenopathy. Lungs are clear to A&P. Cardiac examination is essentially unremarkable with regular rate and rhythm without murmur rub or thrill. Abdomen is benign with no organomegaly or masses noted. Motor sensory and DTR levels are equal and symmetric in the upper and lower extremities. Cranial nerves II through XII are grossly intact. Proprioception is intact. No peripheral adenopathy or edema is identified. No motor or sensory levels are noted. Crude visual fields are within normal range.  LABORATORY DATA: Cytology reports reviewed    RADIOLOGY RESULTS: PET CT and CT scans reviewed   IMPRESSION: Left hilar metastatic pleomorphic sarcoma from patient's known history of left thigh sarcoma  PLAN: At this time like to do SB RT treatment to his left hilum. Would plan on delivering 6000 cGy in 10 fractions. There is considerable literature in the European journals showingSBRT provides excellent local control of pulmonary metastasis from soft tissue sarcoma (STS) and may improve survival in selected patients. SBRT should be considered  for all patients with pulmonary metastasis (PM) and evaluated in a multidisciplinary team. The European articles used 6008 fractions I will start drop that slightly to 6010 fractions based on its close proximity to the pulmonary vasculature heart and esophagus. Risks and benefits of treatment including possible development of cough fatigue alteration of blood counts possible slight dysphasia from radiation esophagitis all were discussed in detail with the patient and his wife. They both seem to comprehend my treatment plan well. I personally set up and ordered CT simulation.  I would like to take this opportunity to thank you for allowing me to participate in the care of your patient.Armstead Peaks., MD

## 2016-08-28 ENCOUNTER — Ambulatory Visit
Admission: RE | Admit: 2016-08-28 | Discharge: 2016-08-28 | Disposition: A | Discharge status: Home / Self Care | Payer: Medicare Other | Source: Ambulatory Visit | Attending: Radiation Oncology | Admitting: Radiation Oncology

## 2016-08-28 DIAGNOSIS — Z87891 Personal history of nicotine dependence: Secondary | ICD-10-CM | POA: Diagnosis not present

## 2016-08-28 DIAGNOSIS — I252 Old myocardial infarction: Secondary | ICD-10-CM | POA: Diagnosis not present

## 2016-08-28 DIAGNOSIS — Z8673 Personal history of transient ischemic attack (TIA), and cerebral infarction without residual deficits: Secondary | ICD-10-CM | POA: Diagnosis not present

## 2016-08-28 DIAGNOSIS — Z79899 Other long term (current) drug therapy: Secondary | ICD-10-CM | POA: Diagnosis not present

## 2016-08-28 DIAGNOSIS — C4922 Malignant neoplasm of connective and soft tissue of left lower limb, including hip: Secondary | ICD-10-CM | POA: Diagnosis not present

## 2016-08-28 DIAGNOSIS — R0602 Shortness of breath: Secondary | ICD-10-CM | POA: Diagnosis not present

## 2016-08-28 DIAGNOSIS — Z510 Encounter for antineoplastic radiation therapy: Secondary | ICD-10-CM | POA: Diagnosis not present

## 2016-08-28 DIAGNOSIS — C7802 Secondary malignant neoplasm of left lung: Secondary | ICD-10-CM | POA: Diagnosis not present

## 2016-08-28 DIAGNOSIS — Z7982 Long term (current) use of aspirin: Secondary | ICD-10-CM | POA: Diagnosis not present

## 2016-08-28 DIAGNOSIS — I2510 Atherosclerotic heart disease of native coronary artery without angina pectoris: Secondary | ICD-10-CM | POA: Diagnosis not present

## 2016-08-28 DIAGNOSIS — Z85828 Personal history of other malignant neoplasm of skin: Secondary | ICD-10-CM | POA: Diagnosis not present

## 2016-08-30 ENCOUNTER — Ambulatory Visit: Payer: Medicare Other | Admitting: Internal Medicine

## 2016-09-02 DIAGNOSIS — Z510 Encounter for antineoplastic radiation therapy: Secondary | ICD-10-CM | POA: Diagnosis not present

## 2016-09-05 ENCOUNTER — Other Ambulatory Visit: Payer: Self-pay | Admitting: *Deleted

## 2016-09-05 DIAGNOSIS — C3412 Malignant neoplasm of upper lobe, left bronchus or lung: Secondary | ICD-10-CM

## 2016-09-06 ENCOUNTER — Observation Stay
Admission: EM | Admit: 2016-09-06 | Discharge: 2016-09-07 | Disposition: A | Discharge status: Home / Self Care | Payer: Medicare Other | Attending: Internal Medicine | Admitting: Internal Medicine

## 2016-09-06 ENCOUNTER — Emergency Department: Payer: Medicare Other

## 2016-09-06 ENCOUNTER — Other Ambulatory Visit: Payer: Self-pay

## 2016-09-06 ENCOUNTER — Encounter: Payer: Self-pay | Admitting: Emergency Medicine

## 2016-09-06 DIAGNOSIS — Z85118 Personal history of other malignant neoplasm of bronchus and lung: Secondary | ICD-10-CM | POA: Insufficient documentation

## 2016-09-06 DIAGNOSIS — I2510 Atherosclerotic heart disease of native coronary artery without angina pectoris: Secondary | ICD-10-CM | POA: Insufficient documentation

## 2016-09-06 DIAGNOSIS — Z7982 Long term (current) use of aspirin: Secondary | ICD-10-CM | POA: Diagnosis not present

## 2016-09-06 DIAGNOSIS — Z87891 Personal history of nicotine dependence: Secondary | ICD-10-CM | POA: Insufficient documentation

## 2016-09-06 DIAGNOSIS — I700 Atherosclerosis of aorta: Secondary | ICD-10-CM | POA: Insufficient documentation

## 2016-09-06 DIAGNOSIS — I252 Old myocardial infarction: Secondary | ICD-10-CM | POA: Insufficient documentation

## 2016-09-06 DIAGNOSIS — J449 Chronic obstructive pulmonary disease, unspecified: Secondary | ICD-10-CM | POA: Diagnosis present

## 2016-09-06 DIAGNOSIS — Z955 Presence of coronary angioplasty implant and graft: Secondary | ICD-10-CM | POA: Insufficient documentation

## 2016-09-06 DIAGNOSIS — J441 Chronic obstructive pulmonary disease with (acute) exacerbation: Principal | ICD-10-CM | POA: Insufficient documentation

## 2016-09-06 DIAGNOSIS — J209 Acute bronchitis, unspecified: Secondary | ICD-10-CM | POA: Insufficient documentation

## 2016-09-06 DIAGNOSIS — J9601 Acute respiratory failure with hypoxia: Secondary | ICD-10-CM | POA: Diagnosis not present

## 2016-09-06 DIAGNOSIS — Z8673 Personal history of transient ischemic attack (TIA), and cerebral infarction without residual deficits: Secondary | ICD-10-CM | POA: Insufficient documentation

## 2016-09-06 DIAGNOSIS — C3412 Malignant neoplasm of upper lobe, left bronchus or lung: Secondary | ICD-10-CM | POA: Insufficient documentation

## 2016-09-06 LAB — CBC
HCT: 49.5 % (ref 40.0–52.0)
Hemoglobin: 17.1 g/dL (ref 13.0–18.0)
MCH: 29.5 pg (ref 26.0–34.0)
MCHC: 34.5 g/dL (ref 32.0–36.0)
MCV: 85.4 fL (ref 80.0–100.0)
PLATELETS: 232 10*3/uL (ref 150–440)
RBC: 5.79 MIL/uL (ref 4.40–5.90)
RDW: 13.4 % (ref 11.5–14.5)
WBC: 9.9 10*3/uL (ref 3.8–10.6)

## 2016-09-06 LAB — BASIC METABOLIC PANEL
Anion gap: 7 (ref 5–15)
BUN: 14 mg/dL (ref 6–20)
CALCIUM: 9.2 mg/dL (ref 8.9–10.3)
CHLORIDE: 99 mmol/L — AB (ref 101–111)
CO2: 26 mmol/L (ref 22–32)
CREATININE: 1.08 mg/dL (ref 0.61–1.24)
GFR calc non Af Amer: >60 mL/min (ref >60)
Glucose, Bld: 132 mg/dL — ABNORMAL HIGH (ref 65–99)
Potassium: 4.7 mmol/L (ref 3.5–5.1)
Sodium: 132 mmol/L — ABNORMAL LOW (ref 135–145)

## 2016-09-06 LAB — BLOOD GAS, ARTERIAL
ACID-BASE DEFICIT: 6.4 mmol/L — AB (ref 0.0–2.0)
Bicarbonate: 17.2 mmol/L — ABNORMAL LOW (ref 20.0–28.0)
O2 SAT: 90 %
PCO2 ART: 29 mmHg — AB (ref 32.0–48.0)
PO2 ART: 60 mmHg — AB (ref 83.0–108.0)
Patient temperature: 37
pH, Arterial: 7.38 (ref 7.350–7.450)

## 2016-09-06 LAB — MAGNESIUM: Magnesium: 2.2 mg/dL (ref 1.7–2.4)

## 2016-09-06 LAB — TROPONIN I

## 2016-09-06 MED ORDER — BENZONATATE 100 MG PO CAPS: 200.0000 mg | ORAL_CAPSULE | Freq: Three times a day (TID) | ORAL | Status: DC | PRN

## 2016-09-06 MED ORDER — ONDANSETRON HCL 4 MG/2ML IJ SOLN: 4.0000 mg | Freq: Four times a day (QID) | INTRAMUSCULAR | Status: DC | PRN

## 2016-09-06 MED ORDER — ENOXAPARIN SODIUM 40 MG/0.4ML ~~LOC~~ SOLN
40.0000 mg | SUBCUTANEOUS | Status: DC
  Administered 2016-09-06: 40 mg via SUBCUTANEOUS
  Filled 2016-09-06: qty 0.4

## 2016-09-06 MED ORDER — SODIUM CHLORIDE 0.9 % IV SOLN: 250.0000 mL | INTRAVENOUS | Status: DC | PRN

## 2016-09-06 MED ORDER — POLYETHYLENE GLYCOL 3350 17 G PO PACK: 17.0000 g | PACK | Freq: Every day | ORAL | Status: DC | PRN

## 2016-09-06 MED ORDER — METHYLPREDNISOLONE SODIUM SUCC 125 MG IJ SOLR
60.0000 mg | Freq: Two times a day (BID) | INTRAMUSCULAR | Status: DC
  Administered 2016-09-06 – 2016-09-07 (×2): 60 mg via INTRAVENOUS
  Filled 2016-09-06 (×2): qty 2

## 2016-09-06 MED ORDER — ACETAMINOPHEN 650 MG RE SUPP: 650.0000 mg | Freq: Four times a day (QID) | RECTAL | Status: DC | PRN

## 2016-09-06 MED ORDER — IPRATROPIUM-ALBUTEROL 0.5-2.5 (3) MG/3ML IN SOLN
3.0000 mL | Freq: Four times a day (QID) | RESPIRATORY_TRACT | Status: DC
  Administered 2016-09-06 – 2016-09-07 (×3): 3 mL via RESPIRATORY_TRACT
  Filled 2016-09-06 (×4): qty 3

## 2016-09-06 MED ORDER — METHYLPREDNISOLONE SODIUM SUCC 125 MG IJ SOLR
125.0000 mg | Freq: Once | INTRAMUSCULAR | Status: AC
  Administered 2016-09-06: 125 mg via INTRAVENOUS
  Filled 2016-09-06: qty 2

## 2016-09-06 MED ORDER — DOXYLAMINE SUCCINATE (SLEEP) 25 MG PO TABS
25.0000 mg | ORAL_TABLET | Freq: Every day | ORAL | Status: DC
  Administered 2016-09-06: 25 mg via ORAL
  Filled 2016-09-06 (×2): qty 1

## 2016-09-06 MED ORDER — HYDROCOD POLST-CPM POLST ER 10-8 MG/5ML PO SUER: 5.0000 mL | Freq: Two times a day (BID) | ORAL | Status: DC | PRN

## 2016-09-06 MED ORDER — IPRATROPIUM-ALBUTEROL 0.5-2.5 (3) MG/3ML IN SOLN
3.0000 mL | Freq: Once | RESPIRATORY_TRACT | Status: AC
  Administered 2016-09-06: 3 mL via RESPIRATORY_TRACT
  Filled 2016-09-06: qty 3

## 2016-09-06 MED ORDER — SODIUM CHLORIDE 0.9% FLUSH
3.0000 mL | Freq: Two times a day (BID) | INTRAVENOUS | Status: DC
  Administered 2016-09-06 – 2016-09-07 (×2): 3 mL via INTRAVENOUS

## 2016-09-06 MED ORDER — OMEGA-3-ACID ETHYL ESTERS 1 G PO CAPS
1.0000 g | ORAL_CAPSULE | Freq: Every day | ORAL | Status: DC
  Administered 2016-09-07: 1 g via ORAL
  Filled 2016-09-06: qty 1

## 2016-09-06 MED ORDER — ACETAMINOPHEN 325 MG PO TABS: 650.0000 mg | ORAL_TABLET | Freq: Four times a day (QID) | ORAL | Status: DC | PRN

## 2016-09-06 MED ORDER — HYDRALAZINE HCL 20 MG/ML IJ SOLN: 10.0000 mg | Freq: Four times a day (QID) | INTRAMUSCULAR | Status: DC | PRN

## 2016-09-06 MED ORDER — BENZONATATE 100 MG PO CAPS: 100.0000 mg | ORAL_CAPSULE | Freq: Three times a day (TID) | ORAL | Status: DC | PRN

## 2016-09-06 MED ORDER — SODIUM CHLORIDE 0.9% FLUSH: 3.0000 mL | INTRAVENOUS | Status: DC | PRN

## 2016-09-06 MED ORDER — LEVOFLOXACIN 500 MG PO TABS
500.0000 mg | ORAL_TABLET | Freq: Every day | ORAL | Status: DC
  Administered 2016-09-06: 500 mg via ORAL
  Filled 2016-09-06 (×2): qty 1

## 2016-09-06 MED ORDER — ALBUTEROL SULFATE (2.5 MG/3ML) 0.083% IN NEBU
5.0000 mg | INHALATION_SOLUTION | Freq: Once | RESPIRATORY_TRACT | Status: AC
  Administered 2016-09-06: 5 mg via RESPIRATORY_TRACT
  Filled 2016-09-06: qty 6

## 2016-09-06 MED ORDER — ONDANSETRON HCL 4 MG PO TABS: 4.0000 mg | ORAL_TABLET | Freq: Four times a day (QID) | ORAL | Status: DC | PRN

## 2016-09-06 MED ORDER — IOPAMIDOL (ISOVUE-370) INJECTION 76%
75.0000 mL | Freq: Once | INTRAVENOUS | Status: AC | PRN
  Administered 2016-09-06: 75 mL via INTRAVENOUS

## 2016-09-06 MED ORDER — ALBUTEROL SULFATE (2.5 MG/3ML) 0.083% IN NEBU
2.5000 mg | INHALATION_SOLUTION | RESPIRATORY_TRACT | Status: DC | PRN
  Administered 2016-09-06 (×2): 2.5 mg via RESPIRATORY_TRACT
  Filled 2016-09-06 (×2): qty 3

## 2016-09-06 MED ORDER — ASPIRIN 325 MG PO TABS
325.0000 mg | ORAL_TABLET | Freq: Every day | ORAL | Status: DC
  Administered 2016-09-06: 325 mg via ORAL
  Filled 2016-09-06: qty 1

## 2016-09-06 NOTE — ED Provider Notes (Signed)
[facility] Emergency Department Provider Note    None    (approximate)  I have reviewed the triage vital signs and the nursing notes.   HISTORY  Chief Complaint Shortness of Breath    HPI Stephen [name] is a 75 y.o. male with history of sarcoma with metastasis to cyst to bilateral lungs presents with 3 days of worsening shortness of breath, cough and productive sputum. Does have some pain with inspiration.  Denies any fevers or chills. No nausea or vomiting. Was seen at urgent care yesterday and discharged on antibiotics and an inhaler but has not had any improvement. States today he got so short of breath that he could not walk through his house.  He has a significant past smoking history and is currently being worked up as an outpatient for probable COPD but does not have a formal diagnosis of such.   Past Medical History:  Diagnosis Date  . Cancer ([place])    Lung CA- Bilateral  . Coronary artery disease   . Lung cancer ([place]) 07/16/2015  . Myocardial infarction [facility])    coronary stent  . Shortness of breath dyspnea   . Stroke [facility])     Patient Active Problem List   Diagnosis Date Noted  . Hilar mass   . Hilar adenopathy 07/25/2016  . DOE (dyspnea on exertion) 07/25/2016  . Malignant neoplasm of upper lobe, left bronchus or lung ([place]) 07/08/2016  . Lung cancer ([place]) 07/16/2015  . AAA (abdominal aortic aneurysm) without rupture ([place]) 06/07/2015  . CAD in native artery 06/07/2015  . 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 Peripheral blood vessel disorder ([place]) 06/07/2015  . Peripheral vascular disease ([place]) 06/07/2015  . Abdominal aortic aneurysm (AAA) without rupture ([place]) 06/07/2015    Past Surgical History:  Procedure Laterality Date  . CAROTID ENDARTERECTOMY Right   . ENDOBRONCHIAL ULTRASOUND N/A 08/08/2016   Procedure: ENDOBRONCHIAL ULTRASOUND;  Surgeon: Vilinda Boehringer, MD;  Location: [facility] ORS;  Service:  Cardiopulmonary;  Laterality: N/A;  . SKIN CANCER EXCISION Left   . THORACOTOMY Bilateral     Prior to Admission medications   Medication Sig Start Date End Date Taking? Authorizing Provider  albuterol (PROAIR HFA) 108 (90 Base) MCG/ACT inhaler Inhale 1-2 puffs into the lungs every 6 (six) hours as needed for wheezing or shortness of breath. 07/08/16   Cammie Sickle, MD  aspirin 325 MG tablet Take 325 mg by mouth at bedtime.     Historical Provider, MD  Cholecalciferol (VITAMIN D-3 PO) Take 1 tablet by mouth daily.     Historical Provider, MD  Doxylamine Succinate, Sleep, (SLEEP AID PO) Take 1 tablet by mouth at bedtime.    Historical Provider, MD  Omega-3 Fatty Acids (FISH OIL) 1000 MG CAPS Take 1 capsule by mouth daily.    Historical Provider, MD    Allergies Review of patient's allergies indicates no known allergies.  [place]: no bleeding disorders  Social History Social History  Substance Use Topics  . Smoking status: Former Smoker    Quit date: 07/31/2003  . Smokeless tobacco: Never Used  . Alcohol use No    Review of Systems Patient denies headaches, rhinorrhea, blurry vision, numbness, shortness of breath, chest pain, edema, cough, abdominal pain, nausea, vomiting, diarrhea, dysuria, fevers, rashes or hallucinations unless otherwise stated above in HPI. ____________________________________________   PHYSICAL EXAM:  VITAL SIGNS: Vitals:   09/06/16 1144  BP: (!) 149/124  Pulse: 96  Temp: 98.2 F (36.8  C)   Constitutional: Alert and oriented. Ill appearing in moderate respiratory distress Eyes: Conjunctivae are normal. PERRL. EOMI. Head: Atraumatic. Nose: No congestion/rhinnorhea. Mouth/Throat: Mucous membranes are moist.  Oropharynx non-erythematous. Neck: No stridor. Painless ROM. No cervical spine tenderness to palpation Hematological/Lymphatic/Immunilogical: No cervical lymphadenopathy. Cardiovascular: Normal rate, regular rhythm. Grossly normal heart sounds.   Good peripheral circulation. Respiratory: moderate respiratory distress, marked tachypnea.    No retractions. Diminished breath sounds bilaterally, Gastrointestinal: Soft and nontender. No distention. No abdominal bruits. No CVA tenderness. Genitourinary:  Musculoskeletal: No lower extremity tenderness nor edema.  No joint effusions. Neurologic:  Normal speech and language. No gross focal neurologic deficits are appreciated. No gait instability. Skin:  Skin is warm, dry and intact. No rash noted. Psychiatric: Mood and affect are normal. Speech and behavior are normal.  ____________________________________________   LABS (all labs ordered are listed, but only abnormal results are displayed)  Results for orders placed or performed during the hospital encounter of 09/06/16 (from the past 24 hour(s))  Basic metabolic panel     Status: Abnormal   Collection Time: 09/06/16 11:48 AM  Result Value Ref Range   Sodium 132 (L) 135 - 145 mmol/L   Potassium 4.7 3.5 - 5.1 mmol/L   Chloride 99 (L) 101 - 111 mmol/L   CO2 26 22 - 32 mmol/L   Glucose, Bld 132 (H) 65 - 99 mg/dL   BUN 14 6 - 20 mg/dL   Creatinine, Ser 1.08 0.61 - 1.24 mg/dL   Calcium 9.2 8.9 - 10.3 mg/dL   GFR calc non Af Amer >60 >60 mL/min   GFR calc Af Amer >60 >60 mL/min   Anion gap 7 5 - 15  CBC     Status: None   Collection Time: 09/06/16 11:48 AM  Result Value Ref Range   WBC 9.9 3.8 - 10.6 K/uL   RBC 5.79 4.40 - 5.90 MIL/uL   Hemoglobin 17.1 13.0 - 18.0 g/dL   HCT 49.5 40.0 - 52.0 %   MCV 85.4 80.0 - 100.0 fL   MCH 29.5 26.0 - 34.0 pg   MCHC 34.5 32.0 - 36.0 g/dL   RDW 13.4 11.5 - 14.5 %   Platelets 232 150 - 440 K/uL  Troponin I     Status: None   Collection Time: 09/06/16 11:48 AM  Result Value Ref Range   Troponin I <0.03 <0.03 ng/mL  Magnesium     Status: None   Collection Time: 09/06/16 11:48 AM  Result Value Ref Range   Magnesium 2.2 1.7 - 2.4 mg/dL    ____________________________________________  EKG My review and personal interpretation at Time: 11:49   Indication: sb  Rate: 90  Rhythm: sinus Axis: normal Other: non specific changes, no acute ischemia ____________________________________________  RADIOLOGY  CXR my read shows no evidence of acute cardiopulmonary process. ____________________________________________   PROCEDURES  Procedure(s) performed: none    Critical Care performed: no ____________________________________________   INITIAL IMPRESSION / ASSESSMENT AND PLAN / ED COURSE  Pertinent labs & imaging results that were available during my care of the patient were reviewed by me and considered in my medical decision making (see chart for details).  DDX: Asthma, copd, CHF, pna, ptx, malignancy, Pe, anemia   HAYDAN MANSOURI is a 75 y.o. who presents to the ED with shortness of breath and cough. Afebrile hemodynamically stable without any hypoxia but is tachypnea And has significant wheezing on exam. We'll give albuterol and Atrovent nebulizer treatments as well as IV steroids.  Chest x-ray without evidence of consolidation. Based on his history and presentation I am concerned for PE therefore will order CT imaging to further evaluate and also characterize any worsening masses or postobstructive pneumonia.  The patient will be placed on continuous pulse oximetry and telemetry for monitoring.  Laboratory evaluation will be sent to evaluate for the above complaints.     Clinical Course  Comment By Time  Patient reassessed and still with marked tachypnea as well as diffuse expiratory wheezing. Had some improvement with albuterol treatments. But still with significant dyspnea at rest. CT chest does not show any evidence of PE but does show significant COPD which is likely his underlying pathology. Based on his age and comorbidities do feel patient will require admission to hospital for additional nebulizer treatments and  respiratory monitoring.  Have discussed with the patient and available family all diagnostics and treatments performed thus far and all questions were answered to the best of my ability. The patient demonstrates understanding and agreement with plan.  Merlyn Lot, MD 09/15 1515     ____________________________________________   FINAL CLINICAL IMPRESSION(S) / ED DIAGNOSES  Final diagnoses:  COPD with acute exacerbation ([place])      NEW MEDICATIONS STARTED DURING THIS VISIT:  New Prescriptions   No medications on file     Note:  This document was prepared using Dragon voice recognition software and may include unintentional dictation errors.    Merlyn Lot, MD 09/06/16 [phone]

## 2016-09-06 NOTE — ED Triage Notes (Signed)
Pt presents with SHOB and coughing.  Pt states he had a fever yesterday but does not remember the temp.  Pt has been prescribed levofloxacin at the [facility] clinic but states he cannot catch his breath and had coughed up [place]-tinged mucus yesterday.

## 2016-09-06 NOTE — H&P (Signed)
[facility] [place] NAME: Stephen [name]    MR#:  [id number]  DATE OF BIRTH:  [birth year]  DATE OF ADMISSION:  09/06/2016  PRIMARY CARE PHYSICIAN: No PCP Per Patient   REQUESTING/REFERRING PHYSICIAN: None  CHIEF COMPLAINT:   Chief Complaint  Patient presents with  . Shortness of Breath    HISTORY OF PRESENT ILLNESS:  Stephen [name]  is a 75 y.o. male with a known history of CAD, Past tobacco abuse, lung cancer with possible COPD presents to the emergency room complaining of 2 days of worsening shortness of breath and cough. Patient was seen in urgent care yesterday started on Levaquin along with Tessalon Perles. If no improvement patient presented to the emergency room. Here he has received high-dose IV steroids along with 3 rounds of nebulizer treatment with only mild improvement. He continues to have acute wheezing, shortness of breath and is being admitted to the hospitalist service. He felt warm like he had a fever at home but temperature was not recorded. No chills. CT scan of the chest shows lung cancer but no pneumonia. Patient does not have a formal diagnosis of COPD but is being worked up at [facility] pulmonary clinic and is supposed to have lung function study next week with Dr. Stevenson Clinch. Has albuterol inhaler at home that he used which did not help.  PAST MEDICAL HISTORY:   Past Medical History:  Diagnosis Date  . Cancer ([place])    Lung CA- Bilateral  . Coronary artery disease   . Lung cancer ([place]) 07/16/2015  . Myocardial infarction [facility])    coronary stent  . Shortness of breath dyspnea   . Stroke [facility])     PAST SURGICAL HISTORY:   Past Surgical History:  Procedure Laterality Date  . CAROTID ENDARTERECTOMY Right   . ENDOBRONCHIAL ULTRASOUND N/A 08/08/2016   Procedure: ENDOBRONCHIAL ULTRASOUND;  Surgeon: Vilinda Boehringer, MD;  Location: [facility] ORS;  Service: Cardiopulmonary;  Laterality: N/A;  . SKIN CANCER EXCISION Left   .  THORACOTOMY Bilateral     SOCIAL HISTORY:   Social History  Substance Use Topics  . Smoking status: Former Smoker    Quit date: 07/31/2003  . Smokeless tobacco: Never Used  . Alcohol use No    FAMILY HISTORY:  No family history on file.  DRUG ALLERGIES:  No Known Allergies  REVIEW OF SYSTEMS:   ROS  MEDICATIONS AT HOME:   Prior to Admission medications   Medication Sig Start Date End Date Taking? Authorizing Provider  albuterol (PROAIR HFA) 108 (90 Base) MCG/ACT inhaler Inhale 1-2 puffs into the lungs every 6 (six) hours as needed for wheezing or shortness of breath. 07/08/16  Yes Cammie Sickle, MD  aspirin 325 MG tablet Take 325 mg by mouth at bedtime.    Yes Historical Provider, MD  benzonatate (TESSALON) 200 MG capsule Take 200 mg by mouth 3 (three) times daily as needed for cough.   Yes Historical Provider, MD  Cholecalciferol (VITAMIN D-3 PO) Take 1 tablet by mouth daily.    Yes Historical Provider, MD  Doxylamine Succinate, Sleep, (SLEEP AID PO) Take 1 tablet by mouth at bedtime.   Yes Historical Provider, MD  levofloxacin (LEVAQUIN) 500 MG tablet Take 500 mg by mouth daily.   Yes Historical Provider, MD  Omega-3 Fatty Acids (FISH OIL) 1000 MG CAPS Take 1 capsule by mouth daily.   Yes Historical Provider, MD     VITAL SIGNS:  Blood pressure 136/70,  pulse 100, temperature 98.2 F (36.8 C), resp. rate (!) 22, height '6\' 2"'$  (1.88 m), weight 103.4 kg (228 lb), SpO2 95 %.  PHYSICAL EXAMINATION:  Physical Exam  GENERAL:  75 y.o.-year-old patient lying in the bed with no acute distress. Tremulous EYES: Pupils equal, round, reactive to light and accommodation. No scleral icterus. Extraocular muscles intact.  HEENT: Head atraumatic, normocephalic. Oropharynx and nasopharynx clear. No oropharyngeal erythema, moist oral mucosa  NECK:  Supple, no jugular venous distention. No thyroid enlargement, no tenderness.  LUNGS: Normal breath sounds bilaterally, no wheezing, rales,  rhonchi. No use of accessory muscles of respiration.  CARDIOVASCULAR: S1, S2 normal. No murmurs, rubs, or gallops.  ABDOMEN: Soft, nontender, nondistended. Bowel sounds present. No organomegaly or mass.  EXTREMITIES: No pedal edema, cyanosis, or clubbing. + 2 pedal & radial pulses b/l.   NEUROLOGIC: Cranial nerves II through XII are intact. No focal Motor or sensory deficits appreciated b/l PSYCHIATRIC: The patient is alert and oriented x 3. Good affect. SKIN: No obvious rash, lesion, or ulcer.   LABORATORY PANEL:   CBC  Recent Labs Lab 09/06/16 1148  WBC 9.9  HGB 17.1  HCT 49.5  PLT 232   ------------------------------------------------------------------------------------------------------------------  Chemistries   Recent Labs Lab 09/06/16 1148  NA 132*  K 4.7  CL 99*  CO2 26  GLUCOSE 132*  BUN 14  CREATININE 1.08  CALCIUM 9.2  MG 2.2   ------------------------------------------------------------------------------------------------------------------  Cardiac Enzymes  Recent Labs Lab 09/06/16 1148  TROPONINI <0.03   ------------------------------------------------------------------------------------------------------------------  RADIOLOGY:  Dg Chest 2 View  Result Date: 09/06/2016 CLINICAL DATA:  Cough for 2 weeks following bronchoscopy, shortness of breath, LEFT-sided chest pain, history metastatic lung cancer, coronary disease post MI EXAM: CHEST  2 VIEW COMPARISON:  01/03/2015; interval PET-CT 07/24/2016, CT chest 07/18/2016 FINDINGS: Normal heart size, mediastinal contours and pulmonary vascularity. Calcified and tortuous thoracic aorta. Postsurgical deformities of the lateral LEFT chest wall. Emphysematous and bronchitic changes consistent with COPD. Scarring at LEFT base anteriorly. No definite acute infiltrate, pleural effusion, or pneumothorax. Questionable nodular density versus nipple shadow at lateral RIGHT lung base. Bones demineralized. IMPRESSION:  Postsurgical changes and scarring in LEFT chest. Underlying COPD changes. No definite acute abnormalities. Question nodular density versus nipple shadow at lateral RIGHT lung base, without nodule identified at this site on prior PET-CT. This could be assessed by a repeat PA chest radiograph with nipple markers or assessed at time of follow-up CT chest. Aortic atherosclerosis. Electronically Signed   By: Lavonia Dana M.D.   On: 09/06/2016 12:09   Ct Angio Chest Pe W And/or Wo Contrast  Result Date: 09/06/2016 CLINICAL DATA:  Increasing shortness of breath and nonproductive cough over last 48 hours, history of lung cancer with BILATERAL lower lobectomies, coronary artery disease post MI, former smoker EXAM: CT ANGIOGRAPHY CHEST WITH CONTRAST TECHNIQUE: Multidetector CT imaging of the chest was performed using the standard protocol during bolus administration of intravenous contrast. Multiplanar CT image reconstructions and MIPs were obtained to evaluate the vascular anatomy. CONTRAST:  75 cc Isovue 370 IV COMPARISON:  07/18/2016 FINDINGS: Cardiovascular: Atherosclerotic calcification aorta, coronary arteries, and proximal great vessels. Aorta normal caliber without aneurysm or dissection. Pulmonary arteries well opacified and patent. No evidence of pulmonary embolism. No pericardial effusion. Mediastinum/Nodes: Esophagus unremarkable. Base of cervical region normal appearance. LEFT hilar mass/ adenopathy, 5.2 x 3.7 cm image 50 previously 3.4 x 3.1 cm. Additional enlarged sub carinal node 11 mm short axis image 51. Additional normal size mediastinal and  hilar nodes. Lungs/Pleura: Minimal scarring in lower lobes bilaterally. Lungs hyperexpanded. Stable RIGHT lung nodule 3 mm diameter image 53. Reported pleural thickening at the posterior RIGHT chest measured on the previous exam is shown to be fat attenuation on soft tissue windows. Upper Abdomen: Unremarkable Musculoskeletal: Subcutaneous bilobed nodule posterior  upper chest just LEFT of midline 4.3 x 2.1 cm present since at least 08/06/2011 question sebaceous cyst or epidermal inclusion cyst. Diffuse osseous demineralization. Posttraumatic deformities of ribs bilaterally. No acute destructive lesions to suggest metastases. Review of the MIP images confirms the above findings. IMPRESSION: No evidence of pulmonary embolism. Postsurgical changes and scarring within the chest bilaterally with underlying COPD changes. Interval increase in size of LEFT hilar mass/adenopathy and a mildly enlarged subcarinal node. Aortic atherosclerosis and coronary arterial calcification. Electronically Signed   By: Lavonia Dana M.D.   On: 09/06/2016 14:57     IMPRESSION AND PLAN:   * Acute COPD exacerbation -IV steroids, Antibiotics - Scheduled Nebulizers - Inhalers -Wean O2 as tolerated - Consult pulmonary if no improvement  * CAD Stable  * Lung cancer starting radiation treatment from next week.  * DVT prophylaxis with Lovenox  All the records are reviewed and case discussed with ED provider. Management plans discussed with the patient, family and they are in agreement.  CODE STATUS: FULL CODE  TOTAL TIME TAKING CARE OF THIS PATIENT: 40 minutes.   Hillary Bow R M.D on 09/06/2016 at 3:30 PM  Between 7am to 6pm - Pager - [phone]  After 6pm go to [email or web address] - password [email or web address] [facility]  [facility]  Office  [phone]  CC: Primary care physician; No PCP Per Patient  Note: This dictation was prepared with Dragon dictation along with smaller phrase technology. Any transcriptional errors that result from this process are unintentional.

## 2016-09-07 DIAGNOSIS — J449 Chronic obstructive pulmonary disease, unspecified: Secondary | ICD-10-CM | POA: Diagnosis present

## 2016-09-07 LAB — BASIC METABOLIC PANEL
Anion gap: 8 (ref 5–15)
BUN: 18 mg/dL (ref 6–20)
CHLORIDE: 104 mmol/L (ref 101–111)
CO2: 24 mmol/L (ref 22–32)
CREATININE: 0.96 mg/dL (ref 0.61–1.24)
Calcium: 9.2 mg/dL (ref 8.9–10.3)
GFR calc Af Amer: >60 mL/min (ref >60)
GFR calc non Af Amer: >60 mL/min (ref >60)
Glucose, Bld: 209 mg/dL — ABNORMAL HIGH (ref 65–99)
POTASSIUM: 4.6 mmol/L (ref 3.5–5.1)
SODIUM: 136 mmol/L (ref 135–145)

## 2016-09-07 LAB — CBC
HEMATOCRIT: 46 % (ref 40.0–52.0)
Hemoglobin: 15.7 g/dL (ref 13.0–18.0)
MCH: 29 pg (ref 26.0–34.0)
MCHC: 34 g/dL (ref 32.0–36.0)
MCV: 85.3 fL (ref 80.0–100.0)
PLATELETS: 214 10*3/uL (ref 150–440)
RBC: 5.4 MIL/uL (ref 4.40–5.90)
RDW: 13.6 % (ref 11.5–14.5)
WBC: 5.9 10*3/uL (ref 3.8–10.6)

## 2016-09-07 MED ORDER — BENZONATATE 100 MG PO CAPS: 100.0000 mg | ORAL_CAPSULE | Freq: Three times a day (TID) | ORAL | 0 refills | Status: DC | PRN

## 2016-09-07 MED ORDER — PREDNISONE 10 MG PO TABS: 10.0000 mg | ORAL_TABLET | Freq: Every day | ORAL | 0 refills | Status: DC

## 2016-09-07 MED ORDER — LEVOFLOXACIN 500 MG PO TABS: 500.0000 mg | ORAL_TABLET | Freq: Every day | ORAL | 0 refills | Status: DC

## 2016-09-07 NOTE — Progress Notes (Signed)
Pt d/c to home today. IV removed intact.  Rx's given to pt w/all questions and concerns addressed.  D/C paperwork reviewed and education provided with all questions and concerns addressed.  Pt wife at bedside for home transport.

## 2016-09-07 NOTE — Discharge Summary (Signed)
[facility] at [place] NAME: Stephen [name]    MR#:  [id number]  DATE OF BIRTH:  1941-10-10  DATE OF ADMISSION:  09/06/2016 ADMITTING PHYSICIAN: Hillary Bow, MD  DATE OF DISCHARGE: [phone]   PRIMARY CARE PHYSICIAN: Kem Kays   ADMISSION DIAGNOSIS:  COPD with acute exacerbation ([place]) [J44.1]  DISCHARGE DIAGNOSIS:  Active Problems:   COPD exacerbation ([place])   SECONDARY DIAGNOSIS:   Past Medical History:  Diagnosis Date  . Cancer ([place])    Lung CA- Bilateral  . Coronary artery disease   . Lung cancer ([place]) 07/16/2015  . Myocardial infarction [facility])    coronary stent  . Shortness of breath dyspnea   . Stroke [facility])     HOSPITAL COURSE:   75 year old male with a history of lung cancer who presents with worsening shortness of breath and cough.  1. Acute hypoxic respiratory failure: Patient is currently being worked up for possible COPD. Patient had acute bronchitis. Patient had much improvement with IV steroids and Levaquin. He will be discharged on by mouth steroid taper. He does not require oxygen at discharge. He has follow-up with pulmonologist next week for outpatient pulmonary function test.  2. History of lung cancer: Patient is starting radiation treatment next week.  3. ASCVD: Patient will continue on aspirin. He is followed by Dr.fath. He is not on beta blocker or statin therapy as per cardiologist.    DISCHARGE CONDITIONS AND DIET:  Stable regular diet  CONSULTS OBTAINED:    DRUG ALLERGIES:  No Known Allergies  DISCHARGE MEDICATIONS:   Current Discharge Medication List    START taking these medications   Details  predniSONE (DELTASONE) 10 MG tablet Take 1 tablet (10 mg total) by mouth daily with breakfast. 60 mg PO (ORAL) x 2 days 50 mg PO (ORAL)  x 2 days 40 mg PO (ORAL)  x 2 days 30 mg PO  (ORAL)  x 2 days 20 mg PO  (ORAL) x 2 days 10 mg PO  (ORAL) x 2 days then stop Qty: 42 tablet, Refills: 0       CONTINUE these medications which have CHANGED   Details  benzonatate (TESSALON PERLES) 100 MG capsule Take 1 capsule (100 mg total) by mouth 3 (three) times daily as needed for cough. Qty: 30 capsule, Refills: 0    levofloxacin (LEVAQUIN) 500 MG tablet Take 1 tablet (500 mg total) by mouth daily. Qty: 5 tablet, Refills: 0      CONTINUE these medications which have NOT CHANGED   Details  albuterol (PROAIR HFA) 108 (90 Base) MCG/ACT inhaler Inhale 1-2 puffs into the lungs every 6 (six) hours as needed for wheezing or shortness of breath. Qty: 1 Inhaler, Refills: 6   Associated Diagnoses: Malignant neoplasm of upper lobe, left bronchus or lung (HCC)    aspirin 325 MG tablet Take 325 mg by mouth at bedtime.     Cholecalciferol (VITAMIN D-3 PO) Take 1 tablet by mouth daily.     Doxylamine Succinate, Sleep, (SLEEP AID PO) Take 1 tablet by mouth at bedtime.    Omega-3 Fatty Acids (FISH OIL) 1000 MG CAPS Take 1 capsule by mouth daily.              Today   CHIEF COMPLAINT:  Doing well this am + cough no SOB or wheezing   VITAL SIGNS:  Blood pressure (!) 161/78, pulse 87, temperature 97.7 F (36.5 C), temperature source Oral, resp. rate 20, height 6'  2" (1.88 m), weight 101.7 kg (224 lb 3.3 oz), SpO2 96 %.   REVIEW OF SYSTEMS:  Review of Systems  Constitutional: Negative.  Negative for chills, fever and malaise/fatigue.  HENT: Negative.  Negative for ear discharge, ear pain, hearing loss, nosebleeds and sore throat.   Eyes: Negative.  Negative for blurred vision and pain.  Respiratory: Positive for cough. Negative for hemoptysis, shortness of breath and wheezing.   Cardiovascular: Negative.  Negative for chest pain, palpitations and leg swelling.  Gastrointestinal: Negative.  Negative for abdominal pain, blood in stool, diarrhea, nausea and vomiting.  Genitourinary: Negative.  Negative for dysuria.  Musculoskeletal: Negative.  Negative for back pain.  Skin: Negative.    Neurological: Negative for dizziness, tremors, speech change, focal weakness, seizures and headaches.  Endo/Heme/Allergies: Negative.  Does not bruise/bleed easily.  Psychiatric/Behavioral: Negative.  Negative for depression, hallucinations and suicidal ideas.     PHYSICAL EXAMINATION:  GENERAL:  75 y.o.-year-old patient lying in the bed with no acute distress.  NECK:  Supple, no jugular venous distention. No thyroid enlargement, no tenderness.  LUNGS: mild rhonchii left lung no rales wheezing  No use of accessory muscles of respiration.  CARDIOVASCULAR: S1, S2 normal. No murmurs, rubs, or gallops.  ABDOMEN: Soft, non-tender, non-distended. Bowel sounds present. No organomegaly or mass.  EXTREMITIES: No pedal edema, cyanosis, or clubbing.  PSYCHIATRIC: The patient is alert and oriented x 3.  SKIN: No obvious rash, lesion, or ulcer.   DATA REVIEW:   CBC  Recent Labs Lab 09/07/16 0612  WBC 5.9  HGB 15.7  HCT 46.0  PLT 214    Chemistries   Recent Labs Lab 09/06/16 1148 09/07/16 0612  NA 132* 136  K 4.7 4.6  CL 99* 104  CO2 26 24  GLUCOSE 132* 209*  BUN 14 18  CREATININE 1.08 0.96  CALCIUM 9.2 9.2  MG 2.2  --     Cardiac Enzymes  Recent Labs Lab 09/06/16 1148  [place] <0.03    Microbiology Results  '@[id number]'$ @  RADIOLOGY:  Dg Chest 2 View  Result Date: 09/06/2016 CLINICAL DATA:  Cough for 2 weeks following bronchoscopy, shortness of breath, LEFT-sided chest pain, history metastatic lung cancer, coronary disease post MI EXAM: CHEST  2 VIEW COMPARISON:  01/03/2015; interval PET-CT 07/24/2016, CT chest 07/18/2016 FINDINGS: Normal heart size, mediastinal contours and pulmonary vascularity. Calcified and tortuous thoracic aorta. Postsurgical deformities of the lateral LEFT chest wall. Emphysematous and bronchitic changes consistent with COPD. Scarring at LEFT base anteriorly. No definite acute infiltrate, pleural effusion, or pneumothorax. Questionable nodular  density versus nipple shadow at lateral RIGHT lung base. Bones demineralized. IMPRESSION: Postsurgical changes and scarring in LEFT chest. Underlying COPD changes. No definite acute abnormalities. Question nodular density versus nipple shadow at lateral RIGHT lung base, without nodule identified at this site on prior PET-CT. This could be assessed by a repeat PA chest radiograph with nipple markers or assessed at time of follow-up CT chest. Aortic atherosclerosis. Electronically Signed   By: Lavonia Dana M.D.   On: 09/06/2016 12:09   Ct Angio Chest Pe W And/or Wo Contrast  Result Date: 09/06/2016 CLINICAL DATA:  Increasing shortness of breath and nonproductive cough over last 48 hours, history of lung cancer with BILATERAL lower lobectomies, coronary artery disease post MI, former smoker EXAM: CT ANGIOGRAPHY CHEST WITH CONTRAST TECHNIQUE: Multidetector CT imaging of the chest was performed using the standard protocol during bolus administration of intravenous contrast. Multiplanar CT image reconstructions and MIPs were obtained to  evaluate the vascular anatomy. CONTRAST:  75 cc Isovue 370 IV COMPARISON:  07/18/2016 FINDINGS: Cardiovascular: Atherosclerotic calcification aorta, coronary arteries, and proximal great vessels. Aorta normal caliber without aneurysm or dissection. Pulmonary arteries well opacified and patent. No evidence of pulmonary embolism. No pericardial effusion. Mediastinum/Nodes: Esophagus unremarkable. Base of cervical region normal appearance. LEFT hilar mass/ adenopathy, 5.2 x 3.7 cm image 50 previously 3.4 x 3.1 cm. Additional enlarged sub carinal node 11 mm short axis image 51. Additional normal size mediastinal and hilar nodes. Lungs/Pleura: Minimal scarring in lower lobes bilaterally. Lungs hyperexpanded. Stable RIGHT lung nodule 3 mm diameter image 53. Reported pleural thickening at the posterior RIGHT chest measured on the previous exam is shown to be fat attenuation on soft tissue  windows. Upper Abdomen: Unremarkable Musculoskeletal: Subcutaneous bilobed nodule posterior upper chest just LEFT of midline 4.3 x 2.1 cm present since at least 08/06/2011 question sebaceous cyst or epidermal inclusion cyst. Diffuse osseous demineralization. Posttraumatic deformities of ribs bilaterally. No acute destructive lesions to suggest metastases. Review of the MIP images confirms the above findings. IMPRESSION: No evidence of pulmonary embolism. Postsurgical changes and scarring within the chest bilaterally with underlying COPD changes. Interval increase in size of LEFT hilar mass/adenopathy and a mildly enlarged subcarinal node. Aortic atherosclerosis and coronary arterial calcification. Electronically Signed   By: Lavonia Dana M.D.   On: 09/06/2016 14:57      Management plans discussed with the patient and he is in agreement. Stable for discharge   Patient should follow up with oncology   CODE STATUS:     Code Status Orders        Start     Ordered   09/06/16 1527  Full code  Continuous     09/06/16 1528    Code Status History    Date Active Date Inactive Code Status Order ID Comments User Context   This patient has a current code status but no historical code status.      TOTAL TIME TAKING CARE OF THIS PATIENT: 36 minutes.    Note: This dictation was prepared with Dragon dictation along with smaller phrase technology. Any transcriptional errors that result from this process are unintentional.  Eily Louvier M.D on 09/07/2016 at 11:06 AM  Between 7am to 6pm - Pager - [phone] After 6pm go to [email or web address] - password [email or web address] [facility]  Office  [phone]  CC: Primary care physician; No PCP Per Patient

## 2016-09-07 NOTE — Care Management CC44 (Addendum)
Condition Code 44 Documentation Completed  Patient Details  Name: Stephen [name] MRN: [id number] Date of Birth: Aug 27, 1941   Condition Code 44 given:  Yes Patient signature on Condition Code 44 notice:  No Documentation of 2 MD's agreement:  Yes Code 44 added to claim:  Yes Patient had already discharged from room.   Marshell Garfinkel, RN 09/07/2016, 12:33 PM

## 2016-09-07 NOTE — Care Management Note (Signed)
Case Management Note  Patient Details  Name: Stephen [name] MRN: [id number] Date of Birth: 03/17/1941  Subjective/Objective:    [name] did not qualify for home oxygen. Discharge to home with no home health services.                 Action/Plan:   Expected Discharge Date:  09/09/16               Expected Discharge Plan:     In-House Referral:     Discharge planning Services     Post Acute Care Choice:    Choice offered to:     DME Arranged:    DME Agency:     HH Arranged:    HH Agency:     Status of Service:     If discussed at [employer], dates discussed:    Additional Comments:  [name], RN 09/07/2016, 11:43 AM

## 2016-09-07 NOTE — Care Management Obs Status (Signed)
[place] NOTIFICATION   Patient Details  Name: Stephen [name] MRN: [id number] Date of Birth: 02-04-1941   Medicare Observation Status Notification Given:  Yes    Marshell Garfinkel, RN 09/07/2016, 12:23 PM

## 2016-09-09 DIAGNOSIS — Z510 Encounter for antineoplastic radiation therapy: Secondary | ICD-10-CM | POA: Diagnosis not present

## 2016-09-10 ENCOUNTER — Ambulatory Visit
Admission: RE | Admit: 2016-09-10 | Discharge: 2016-09-10 | Disposition: A | Discharge status: Home / Self Care | Payer: Medicare Other | Source: Ambulatory Visit | Attending: Radiation Oncology | Admitting: Radiation Oncology

## 2016-09-10 DIAGNOSIS — Z510 Encounter for antineoplastic radiation therapy: Secondary | ICD-10-CM | POA: Diagnosis not present

## 2016-09-11 ENCOUNTER — Ambulatory Visit
Admission: RE | Admit: 2016-09-11 | Discharge: 2016-09-11 | Disposition: A | Discharge status: Home / Self Care | Payer: Medicare Other | Source: Ambulatory Visit | Attending: Radiation Oncology | Admitting: Radiation Oncology

## 2016-09-11 DIAGNOSIS — Z510 Encounter for antineoplastic radiation therapy: Secondary | ICD-10-CM | POA: Diagnosis not present

## 2016-09-12 ENCOUNTER — Ambulatory Visit
Admission: RE | Admit: 2016-09-12 | Discharge: 2016-09-12 | Disposition: A | Discharge status: Home / Self Care | Payer: Medicare Other | Source: Ambulatory Visit | Attending: Radiation Oncology | Admitting: Radiation Oncology

## 2016-09-12 DIAGNOSIS — Z510 Encounter for antineoplastic radiation therapy: Secondary | ICD-10-CM | POA: Diagnosis not present

## 2016-09-13 ENCOUNTER — Ambulatory Visit
Admission: RE | Admit: 2016-09-13 | Discharge: 2016-09-13 | Disposition: A | Discharge status: Home / Self Care | Payer: Medicare Other | Source: Ambulatory Visit | Attending: Radiation Oncology | Admitting: Radiation Oncology

## 2016-09-13 DIAGNOSIS — Z510 Encounter for antineoplastic radiation therapy: Secondary | ICD-10-CM | POA: Diagnosis not present

## 2016-09-16 ENCOUNTER — Ambulatory Visit
Admission: RE | Admit: 2016-09-16 | Discharge: 2016-09-16 | Disposition: A | Discharge status: Home / Self Care | Payer: Medicare Other | Source: Ambulatory Visit | Attending: Radiation Oncology | Admitting: Radiation Oncology

## 2016-09-16 DIAGNOSIS — Z510 Encounter for antineoplastic radiation therapy: Secondary | ICD-10-CM | POA: Diagnosis not present

## 2016-09-17 ENCOUNTER — Ambulatory Visit
Admission: RE | Admit: 2016-09-17 | Discharge: 2016-09-17 | Disposition: A | Discharge status: Home / Self Care | Payer: Medicare Other | Source: Ambulatory Visit | Attending: Radiation Oncology | Admitting: Radiation Oncology

## 2016-09-17 DIAGNOSIS — Z510 Encounter for antineoplastic radiation therapy: Secondary | ICD-10-CM | POA: Diagnosis not present

## 2016-09-18 ENCOUNTER — Inpatient Hospital Stay: Payer: Medicare Other | Attending: Internal Medicine

## 2016-09-18 ENCOUNTER — Ambulatory Visit
Admission: RE | Admit: 2016-09-18 | Discharge: 2016-09-18 | Disposition: A | Discharge status: Home / Self Care | Payer: Medicare Other | Source: Ambulatory Visit | Attending: Radiation Oncology | Admitting: Radiation Oncology

## 2016-09-18 DIAGNOSIS — Z87891 Personal history of nicotine dependence: Secondary | ICD-10-CM | POA: Insufficient documentation

## 2016-09-18 DIAGNOSIS — Z79899 Other long term (current) drug therapy: Secondary | ICD-10-CM | POA: Insufficient documentation

## 2016-09-18 DIAGNOSIS — C3412 Malignant neoplasm of upper lobe, left bronchus or lung: Secondary | ICD-10-CM | POA: Insufficient documentation

## 2016-09-18 DIAGNOSIS — Z510 Encounter for antineoplastic radiation therapy: Secondary | ICD-10-CM | POA: Diagnosis not present

## 2016-09-18 LAB — CBC
HCT: 45.9 % (ref 40.0–52.0)
Hemoglobin: 15.5 g/dL (ref 13.0–18.0)
MCH: 28.9 pg (ref 26.0–34.0)
MCHC: 33.8 g/dL (ref 32.0–36.0)
MCV: 85.4 fL (ref 80.0–100.0)
PLATELETS: 198 10*3/uL (ref 150–440)
RBC: 5.37 MIL/uL (ref 4.40–5.90)
RDW: 13.8 % (ref 11.5–14.5)
WBC: 6.3 10*3/uL (ref 3.8–10.6)

## 2016-09-19 ENCOUNTER — Ambulatory Visit
Admission: RE | Admit: 2016-09-19 | Discharge: 2016-09-19 | Disposition: A | Discharge status: Home / Self Care | Payer: Medicare Other | Source: Ambulatory Visit | Attending: Radiation Oncology | Admitting: Radiation Oncology

## 2016-09-19 DIAGNOSIS — Z510 Encounter for antineoplastic radiation therapy: Secondary | ICD-10-CM | POA: Diagnosis not present

## 2016-09-20 ENCOUNTER — Ambulatory Visit (INDEPENDENT_AMBULATORY_CARE_PROVIDER_SITE_OTHER): Payer: Medicare Other | Admitting: *Deleted

## 2016-09-20 ENCOUNTER — Ambulatory Visit
Admission: RE | Admit: 2016-09-20 | Discharge: 2016-09-20 | Disposition: A | Discharge status: Home / Self Care | Payer: Medicare Other | Source: Ambulatory Visit | Attending: Radiation Oncology | Admitting: Radiation Oncology

## 2016-09-20 DIAGNOSIS — R0609 Other forms of dyspnea: Secondary | ICD-10-CM

## 2016-09-20 DIAGNOSIS — Z510 Encounter for antineoplastic radiation therapy: Secondary | ICD-10-CM | POA: Diagnosis not present

## 2016-09-20 LAB — PULMONARY FUNCTION TEST
DL/VA % pred: 51 %
DL/VA: 2.46 ml/min/mmHg/L
DLCO UNC: 11.41 ml/min/mmHg
DLCO unc % pred: 30 %
FEF 25-75 POST: 0.9 L/s
FEF 25-75 Pre: 0.96 L/sec
FEF2575-%CHANGE-POST: -6 %
FEF2575-%PRED-POST: 34 %
FEF2575-%PRED-PRE: 36 %
FEV1-%CHANGE-POST: 0 %
FEV1-%PRED-POST: 46 %
FEV1-%PRED-PRE: 47 %
FEV1-POST: 1.68 L
FEV1-PRE: 1.7 L
FEV1FVC-%CHANGE-POST: 6 %
FEV1FVC-%PRED-PRE: 82 %
FEV6-%Change-Post: -6 %
FEV6-%Pred-Post: 56 %
FEV6-%Pred-Pre: 60 %
FEV6-POST: 2.63 L
FEV6-Pre: 2.81 L
FEV6FVC-%Change-Post: 0 %
FEV6FVC-%PRED-POST: 105 %
FEV6FVC-%Pred-Pre: 105 %
FVC-%Change-Post: -6 %
FVC-%PRED-PRE: 57 %
FVC-%Pred-Post: 53 %
FVC-POST: 2.63 L
FVC-PRE: 2.82 L
POST FEV1/FVC RATIO: 64 %
PRE FEV6/FVC RATIO: 100 %
Post FEV6/FVC ratio: 100 %
Pre FEV1/FVC ratio: 60 %

## 2016-09-20 NOTE — Progress Notes (Signed)
PFT performed today with nitrogen washout. 

## 2016-09-20 NOTE — Progress Notes (Signed)
SMW performed today. 

## 2016-09-22 ENCOUNTER — Other Ambulatory Visit: Payer: Self-pay

## 2016-09-22 ENCOUNTER — Emergency Department: Payer: Medicare Other

## 2016-09-22 ENCOUNTER — Emergency Department
Admission: EM | Admit: 2016-09-22 | Discharge: 2016-09-23 | Disposition: A | Discharge status: Home / Self Care | Payer: Medicare Other | Attending: Emergency Medicine | Admitting: Emergency Medicine

## 2016-09-22 DIAGNOSIS — I2510 Atherosclerotic heart disease of native coronary artery without angina pectoris: Secondary | ICD-10-CM | POA: Diagnosis not present

## 2016-09-22 DIAGNOSIS — Z85118 Personal history of other malignant neoplasm of bronchus and lung: Secondary | ICD-10-CM | POA: Diagnosis not present

## 2016-09-22 DIAGNOSIS — Z7982 Long term (current) use of aspirin: Secondary | ICD-10-CM | POA: Diagnosis not present

## 2016-09-22 DIAGNOSIS — I252 Old myocardial infarction: Secondary | ICD-10-CM | POA: Insufficient documentation

## 2016-09-22 DIAGNOSIS — Z79899 Other long term (current) drug therapy: Secondary | ICD-10-CM | POA: Diagnosis not present

## 2016-09-22 DIAGNOSIS — Z87891 Personal history of nicotine dependence: Secondary | ICD-10-CM | POA: Diagnosis not present

## 2016-09-22 DIAGNOSIS — J441 Chronic obstructive pulmonary disease with (acute) exacerbation: Secondary | ICD-10-CM | POA: Diagnosis not present

## 2016-09-22 DIAGNOSIS — R0602 Shortness of breath: Secondary | ICD-10-CM | POA: Diagnosis present

## 2016-09-22 DIAGNOSIS — Z8673 Personal history of transient ischemic attack (TIA), and cerebral infarction without residual deficits: Secondary | ICD-10-CM | POA: Diagnosis not present

## 2016-09-22 LAB — BASIC METABOLIC PANEL
Anion gap: 11 (ref 5–15)
BUN: 19 mg/dL (ref 6–20)
CO2: 22 mmol/L (ref 22–32)
CREATININE: 1.1 mg/dL (ref 0.61–1.24)
Calcium: 8.5 mg/dL — ABNORMAL LOW (ref 8.9–10.3)
Chloride: 102 mmol/L (ref 101–111)
GFR calc Af Amer: >60 mL/min (ref >60)
GLUCOSE: 158 mg/dL — AB (ref 65–99)
POTASSIUM: 3.6 mmol/L (ref 3.5–5.1)
SODIUM: 135 mmol/L (ref 135–145)

## 2016-09-22 LAB — CBC WITH DIFFERENTIAL/PLATELET
Basophils Absolute: 0 10*3/uL (ref 0–0.1)
Basophils Relative: 1 %
EOS ABS: 0.1 10*3/uL (ref 0–0.7)
EOS PCT: 1 %
HCT: 43.7 % (ref 40.0–52.0)
Hemoglobin: 14.9 g/dL (ref 13.0–18.0)
LYMPHS ABS: 0.6 10*3/uL — AB (ref 1.0–3.6)
LYMPHS PCT: 10 %
MCH: 28.9 pg (ref 26.0–34.0)
MCHC: 34 g/dL (ref 32.0–36.0)
MCV: 85 fL (ref 80.0–100.0)
MONO ABS: 0.5 10*3/uL (ref 0.2–1.0)
MONOS PCT: 9 %
Neutro Abs: 5 10*3/uL (ref 1.4–6.5)
Neutrophils Relative %: 79 %
PLATELETS: 183 10*3/uL (ref 150–440)
RBC: 5.13 MIL/uL (ref 4.40–5.90)
RDW: 13.7 % (ref 11.5–14.5)
WBC: 6.3 10*3/uL (ref 3.8–10.6)

## 2016-09-22 LAB — TROPONIN I

## 2016-09-22 MED ORDER — METHYLPREDNISOLONE SODIUM SUCC 125 MG IJ SOLR
125.0000 mg | Freq: Once | INTRAMUSCULAR | Status: AC
  Administered 2016-09-22: 125 mg via INTRAVENOUS
  Filled 2016-09-22: qty 2

## 2016-09-22 MED ORDER — ALBUTEROL SULFATE (2.5 MG/3ML) 0.083% IN NEBU
5.0000 mg | INHALATION_SOLUTION | Freq: Once | RESPIRATORY_TRACT | Status: AC
  Administered 2016-09-22: 5 mg via RESPIRATORY_TRACT
  Filled 2016-09-22: qty 6

## 2016-09-22 MED ORDER — IPRATROPIUM-ALBUTEROL 0.5-2.5 (3) MG/3ML IN SOLN
9.0000 mL | Freq: Once | RESPIRATORY_TRACT | Status: AC
  Administered 2016-09-22: 9 mL via RESPIRATORY_TRACT
  Filled 2016-09-22: qty 9

## 2016-09-22 NOTE — ED Triage Notes (Signed)
Pt to ED via POV, c/o SOB. Pt reports a non-productive cough, and difficulty breathing while at rest, as well as upon exertion; hx of COPD. Pt presents with labored breathing continuously coughing, denies pain, alert & oriented X4.

## 2016-09-22 NOTE — ED Provider Notes (Signed)
[facility] Emergency Department Provider Note   ____________________________________________   First MD Initiated Contact with Patient 09/22/16 2131     (approximate)  I have reviewed the triage vital signs and the nursing notes.   HISTORY  Chief Complaint Shortness of Breath   HPI Stephen [name] is a 75 y.o. 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 He says that he was admitted several weeks ago for a COPD exacerbation and was discharged on a steroid taper as well as antibiotics which she finished this past Thursday, 4 days ago. His wife says that ever since then he has been having worsening shortness of breath. He denies any chest pain. Denies any fever.   Past Medical History:  Diagnosis Date  . Cancer ([place])    Lung CA- Bilateral  . Coronary artery disease   . Lung cancer ([place]) 07/16/2015  . Myocardial infarction    coronary stent  . Shortness of breath dyspnea   . Stroke [facility])     Patient Active Problem List   Diagnosis Date Noted  . COPD (chronic obstructive pulmonary disease) ([place]) 09/07/2016  . COPD exacerbation ([place]) 09/06/2016  . Hilar mass   . Hilar adenopathy 07/25/2016  . DOE (dyspnea on exertion) 07/25/2016  . Malignant neoplasm of upper lobe, left bronchus or lung ([place]) 07/08/2016  . Lung cancer ([place]) 07/16/2015  . AAA (abdominal aortic aneurysm) without rupture ([place]) 06/07/2015  . CAD in native artery 06/07/2015  . 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 Peripheral blood vessel disorder ([place]) 06/07/2015  . Peripheral vascular disease ([place]) 06/07/2015  . Abdominal aortic aneurysm (AAA) without rupture ([place]) 06/07/2015    Past Surgical History:  Procedure Laterality Date  . CAROTID ENDARTERECTOMY Right   . ENDOBRONCHIAL ULTRASOUND N/A 08/08/2016   Procedure: ENDOBRONCHIAL  ULTRASOUND;  Surgeon: Vilinda Boehringer, MD;  Location: [facility] ORS;  Service: Cardiopulmonary;  Laterality: N/A;  . SKIN CANCER EXCISION Left   . THORACOTOMY Bilateral     Prior to Admission medications   Medication Sig Start Date End Date Taking? Authorizing Provider  albuterol (PROAIR HFA) 108 (90 Base) MCG/ACT inhaler Inhale 1-2 puffs into the lungs every 6 (six) hours as needed for wheezing or shortness of breath. 07/08/16   Cammie Sickle, MD  aspirin 325 MG tablet Take 325 mg by mouth at bedtime.     Historical Provider, MD  benzonatate (TESSALON PERLES) 100 MG capsule Take 1 capsule (100 mg total) by mouth 3 (three) times daily as needed for cough. 09/07/16   Bettey Costa, MD  Cholecalciferol (VITAMIN D-3 PO) Take 1 tablet by mouth daily.     Historical Provider, MD  Doxylamine Succinate, Sleep, (SLEEP AID PO) Take 1 tablet by mouth at bedtime.    Historical Provider, MD  levofloxacin (LEVAQUIN) 500 MG tablet Take 1 tablet (500 mg total) by mouth daily. 09/07/16   Bettey Costa, MD  Omega-3 Fatty Acids (FISH OIL) 1000 MG CAPS Take 1 capsule by mouth daily.    Historical Provider, MD  predniSONE (DELTASONE) 10 MG tablet Take 1 tablet (10 mg total) by mouth daily with breakfast. 60 mg PO (ORAL) x 2 days 50 mg PO (ORAL)  x 2 days 40 mg PO (ORAL)  x 2 days 30 mg PO  (ORAL)  x 2 days 20 mg PO  (ORAL) x  2 days 10 mg PO  (ORAL) x 2 days then stop 09/07/16   Bettey Costa, MD    Allergies Review of patient's allergies indicates no known allergies.  History reviewed. No pertinent family history.  Social History Social History  Substance Use Topics  . Smoking status: Former Smoker    Quit date: 07/31/2003  . Smokeless tobacco: Never Used  . Alcohol use No    Review of Systems Constitutional: No fever/chills Eyes: No visual changes. ENT: No sore throat. Cardiovascular: Denies chest pain. Respiratory: As above Gastrointestinal: No abdominal pain.  No nausea, no vomiting.  No diarrhea.  No  constipation. Genitourinary: Negative for dysuria. Musculoskeletal: Negative for back pain. Skin: Negative for rash. Neurological: Negative for headaches, focal weakness or numbness.  10-point ROS otherwise negative.  ____________________________________________   PHYSICAL EXAM:  VITAL SIGNS: ED Triage Vitals  Enc Vitals Group     BP 09/22/16 2125 111/63     Pulse Rate 09/22/16 2125 85     Resp 09/22/16 2125 (!) 22     Temp 09/22/16 2125 98.2 F (36.8 C)     Temp Source 09/22/16 2125 Oral     SpO2 09/22/16 2125 95 %     Weight 09/22/16 2130 228 lb (103.4 kg)     Height 09/22/16 2130 '6\' 2"'$  (1.88 m)     Head Circumference --      Peak Flow --      Pain Score 09/22/16 2157 0     Pain Loc --      Pain Edu? --      Excl. in [place]? --     Constitutional: Alert and oriented. Well appearing and in no acute distress. Eyes: Conjunctivae are normal. PERRL. EOMI. Head: Atraumatic. Nose: No congestion/rhinnorhea. Mouth/Throat: Mucous membranes are moist.   Neck: No stridor.   Cardiovascular: Normal rate, regular rhythm. Grossly normal heart sounds.  Good peripheral circulation. Respiratory: Mildly labored respirations without retractions. Speaking in full sentences. Prolonged expiratory phase with diffuse wheezes.  Gastrointestinal: Soft and nontender. No distention.  Musculoskeletal: No lower extremity tenderness nor edema.  No joint effusions. Neurologic:  Normal speech and language. No gross focal neurologic deficits are appreciated.  Skin:  Skin is warm, dry and intact. No rash noted. Psychiatric: Mood and affect are normal. Speech and behavior are normal.  ____________________________________________   LABS (all labs ordered are listed, but only abnormal results are displayed)  Labs Reviewed  CBC WITH DIFFERENTIAL/PLATELET - Abnormal; Notable for the following:       Result Value   Lymphs Abs 0.6 (*)    All other components within normal limits  BASIC METABOLIC PANEL -  Abnormal; Notable for the following:    Glucose, Bld 158 (*)    Calcium 8.5 (*)    All other components within normal limits  TROPONIN I   ____________________________________________  EKG  ED ECG REPORT I, Doran Stabler, the attending physician, personally viewed and interpreted this ECG.   Date: 09/22/2016  EKG Time: 2127  Rate: 84  Rhythm: normal sinus rhythm  Axis: Normal  Intervals:none  ST&T Change: Minimal ST depressions diffusely. No T-wave inversions.  No significant change including the depressions from 09/08/2016. ____________________________________________  RADIOLOGY  DG Chest 2 View (Accession [id number]) (Order [id number])  Imaging  Date: 09/22/2016 Department: [place] Released By: Lindi Adie, RN (auto-released) Authorizing: Orbie Pyo, MD  Exam Information   Status Exam Begun  Exam Ended   Final [  99] 09/22/2016 11:00 PM 09/22/2016 11:08 PM  PACS Images   Show images for DG Chest 2 View  Study Result   CLINICAL DATA:  Nonproductive cough, shortness of breath, and difficulty breathing.  EXAM: CHEST  2 VIEW  COMPARISON:  09/06/2016  FINDINGS: Postoperative changes with left thoracotomy. Volume loss and scarring in the left lung base. Thickened pleural on the left costophrenic angle. No change since prior study. Hyperinflation of the right lung. No airspace disease or consolidation. Normal heart size and pulmonary vascularity. Calcification of the aorta. Degenerative changes in the spine.  IMPRESSION: No evidence of active pulmonary disease. Postoperative changes in the left chest with volume loss and scarring in the left lung base.   Electronically Signed   By: Lucienne Capers M.D.   On: 09/22/2016 23:50    ____________________________________________   PROCEDURES  Procedure(s) performed:   Procedures  Critical Care performed:    ____________________________________________   INITIAL IMPRESSION / ASSESSMENT AND PLAN / ED COURSE  Pertinent labs & imaging results that were available during my care of the patient were reviewed by me and considered in my medical decision making (see chart for details).  ----------------------------------------- 12:14 AM on 09/23/2016 -----------------------------------------  Patient resting comfortably at this time. 90-95% on room air. Re-auscultated his lungs and he is no longer wheezing. He says he feels back to his baseline. He ambulated around the emergency department and was between 88% to 93% on room air. I discussed with him that he is borderline for admission. However, he says that he is not feeling shortness of breath, even when he was walking around the emergency department. We discussed strict return precautions. Will be discharged with Levaquin as well as prednisone.  Clinical Course     ____________________________________________   FINAL CLINICAL IMPRESSION(S) / ED DIAGNOSES  COPD exacerbation.    NEW MEDICATIONS STARTED DURING THIS VISIT:  New Prescriptions   No medications on file     Note:  This document was prepared using Dragon voice recognition software and may include unintentional dictation errors.    Orbie Pyo, MD 09/23/16 [id number]

## 2016-09-23 ENCOUNTER — Ambulatory Visit
Admission: RE | Admit: 2016-09-23 | Discharge: 2016-09-23 | Disposition: A | Discharge status: Home / Self Care | Payer: Medicare Other | Source: Ambulatory Visit | Attending: Radiation Oncology | Admitting: Radiation Oncology

## 2016-09-23 ENCOUNTER — Ambulatory Visit (INDEPENDENT_AMBULATORY_CARE_PROVIDER_SITE_OTHER): Payer: Medicare Other | Admitting: Internal Medicine

## 2016-09-23 ENCOUNTER — Encounter: Payer: Self-pay | Admitting: Internal Medicine

## 2016-09-23 ENCOUNTER — Telehealth: Payer: Self-pay | Admitting: Internal Medicine

## 2016-09-23 VITALS — BP 130/76 | HR 73 | Ht 74.0 in | Wt 227.0 lb

## 2016-09-23 DIAGNOSIS — Z510 Encounter for antineoplastic radiation therapy: Secondary | ICD-10-CM | POA: Diagnosis not present

## 2016-09-23 DIAGNOSIS — J432 Centrilobular emphysema: Secondary | ICD-10-CM | POA: Diagnosis not present

## 2016-09-23 DIAGNOSIS — J441 Chronic obstructive pulmonary disease with (acute) exacerbation: Secondary | ICD-10-CM

## 2016-09-23 DIAGNOSIS — R0600 Dyspnea, unspecified: Secondary | ICD-10-CM | POA: Diagnosis not present

## 2016-09-23 DIAGNOSIS — R918 Other nonspecific abnormal finding of lung field: Secondary | ICD-10-CM | POA: Diagnosis not present

## 2016-09-23 MED ORDER — PREDNISONE 20 MG PO TABS: 60.0000 mg | ORAL_TABLET | Freq: Every day | ORAL | 0 refills | Status: DC

## 2016-09-23 MED ORDER — DOXYCYCLINE HYCLATE 100 MG PO CAPS: 100.0000 mg | ORAL_CAPSULE | Freq: Two times a day (BID) | ORAL | 0 refills | Status: DC

## 2016-09-23 MED ORDER — ALBUTEROL SULFATE HFA 108 (90 BASE) MCG/ACT IN AERS: 2.0000 | INHALATION_SPRAY | Freq: Four times a day (QID) | RESPIRATORY_TRACT | 0 refills | Status: AC | PRN

## 2016-09-23 MED ORDER — LEVOFLOXACIN 750 MG PO TABS: 750.0000 mg | ORAL_TABLET | Freq: Every day | ORAL | 0 refills | Status: DC

## 2016-09-23 MED ORDER — PREDNISONE 20 MG PO TABS: 20.0000 mg | ORAL_TABLET | Freq: Every day | ORAL | 0 refills | Status: DC

## 2016-09-23 MED ORDER — LEVOFLOXACIN 750 MG PO TABS: 750.0000 mg | ORAL_TABLET | Freq: Once | ORAL | Status: DC

## 2016-09-23 MED ORDER — FLUTICASONE FUROATE-VILANTEROL 200-25 MCG/INH IN AEPB: 1.0000 | INHALATION_SPRAY | Freq: Every day | RESPIRATORY_TRACT | 0 refills | Status: AC

## 2016-09-23 MED ORDER — DOXYCYCLINE HYCLATE 100 MG PO TABS
100.0000 mg | ORAL_TABLET | Freq: Once | ORAL | Status: AC
  Administered 2016-09-23: 100 mg via ORAL
  Filled 2016-09-23: qty 1

## 2016-09-23 MED ORDER — AMBULATORY NON FORMULARY MEDICATION: 0 refills | Status: AC

## 2016-09-23 MED ORDER — AZITHROMYCIN 250 MG PO TABS: ORAL_TABLET | ORAL | 0 refills | Status: AC

## 2016-09-23 MED ORDER — IPRATROPIUM-ALBUTEROL 0.5-2.5 (3) MG/3ML IN SOLN: 3.0000 mL | Freq: Four times a day (QID) | RESPIRATORY_TRACT | 3 refills | Status: DC | PRN

## 2016-09-23 NOTE — Progress Notes (Signed)
[facility] Consultation      MRN# [id number] Stephen [name] 1941-04-20   CC: Chief Complaint  Patient presents with  . Follow-up    PFT/SMW results. recent admission on 09-06-16 for copd exacerbation. pt had bronch on 08-08-16. ED visit 09/22/16 given neb treatment, doxycycline and prednsione. pt states breathing improved with neb treatment. pt c/o sob with exertion, non prod cough & occ wheezing.       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 Treating for COPD   Events since last clinic visit: Patient presents today for follow-up visit of his COPD, left hilar mass. Since his last visit he's had 2 ER visits 1 requiring admission for COPD exacerbation and most recently last night for a bronchospastic episode. Denies any sick contacts, chills, fevers. Patient did get his flu shot this year. Patient states that he's been getting radiation therapy for the past 2 weeks, and is scheduled to complete his radiation therapy at the end of this week for his left hilar mass. Results of his breasts hilar mass shows findings consistent with metastatic sarcoma. He did have a primary function tests along with 6 minute walk test done results as stated below. Today he does still endorse some mild shortness of breath, cough, intermittent wheezing. He denies any smoking.  Last week he completed his prednisone taper antibiotic from his hospitalization. However on Friday had some fever and chills, thinks he may have been exposed to a sick contact at church, and on Sunday night presented to the ER with an 10 shortness of breath, that was relieved with 3 nebulizer treatments of DuoNeb. Last night the ER gave him a  prescription for 60 mg of prednisone 5 days along with doxycycline for 10 days.     ED Visit 09/23/16 early morning 75 y.o. 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 He says that he was admitted several weeks ago for a COPD exacerbation and was discharged on a steroid taper as well as antibiotics which she finished this past Thursday, 4 days ago. His wife says that ever since then he has been having worsening shortness of breath. He denies any chest pain. Denies any fever.  Treated with Duonebs x 2 with significant improvement and dc'ed home.   [facility] 9/15-9/16/3072 75 year old male with a history of lung cancer who presents with worsening shortness of breath and cough.  1. Acute hypoxic respiratory failure: Patient is currently being worked up for possible COPD. Patient had acute bronchitis. Patient had much improvement with IV steroids and Levaquin. He will be discharged on by mouth steroid taper. He does not require oxygen at discharge. He has follow-up with pulmonologist next week for outpatient pulmonary function test.  2. History of lung cancer: Patient is starting radiation treatment next week.  3. ASCVD: Patient will continue on aspirin. He is followed by Dr.fath. He is not on beta blocker or statin therapy as per cardiologist   Current Outpatient Prescriptions:  .  albuterol (PROVENTIL HFA;VENTOLIN HFA) 108 (90 Base) MCG/ACT inhaler, Inhale 2 puffs into the lungs every 6 (six) hours as needed for wheezing or  shortness of breath., Disp: 1 Inhaler, Rfl: 0 .  aspirin 325 MG tablet, Take 325 mg by mouth at bedtime. , Disp: , Rfl:  .  Cholecalciferol (VITAMIN D-3 PO), Take 1 tablet by mouth daily. , Disp: , Rfl:  .  doxycycline (VIBRAMYCIN) 100 MG capsule, Take 1 capsule (100 mg total) by mouth 2 (two) times daily., Disp: 14 capsule, Rfl: 0 .   Doxylamine Succinate, Sleep, (SLEEP AID PO), Take 1 tablet by mouth at bedtime., Disp: , Rfl:  .  Omega-3 Fatty Acids (FISH OIL) 1000 MG CAPS, Take 1 capsule by mouth daily., Disp: , Rfl:  .  predniSONE (DELTASONE) 20 MG tablet, Take 3 tablets (60 mg total) by mouth daily., Disp: 15 tablet, Rfl: 0   Review of Systems  Constitutional: Negative for chills and fever.  Eyes: Negative for blurred vision and double vision.  Respiratory: Positive for cough, sputum production, shortness of breath and wheezing. Negative for hemoptysis.   Gastrointestinal: Negative for heartburn and nausea.  Genitourinary: Negative for dysuria.  Musculoskeletal: Negative for myalgias.  Skin: Negative for rash.  Neurological: Negative for dizziness and headaches.  Endo/Heme/Allergies: Does not bruise/bleed easily.  Psychiatric/Behavioral: Negative for depression.      Allergies:  Review of patient's allergies indicates no known allergies.  Physical Examination:  VS: BP 130/76 (BP Location: Left Arm, Cuff Size: Normal)   Pulse 73   Ht '6\' 2"'$  (1.88 m)   Wt 227 lb (103 kg)   SpO2 97%   BMI 29.15 kg/m   General Appearance: No distress  HEENT: PERRLA, no ptosis, no other lesions noticed Pulmonary:normal breath sounds., diaphragmatic excursion normal.No wheezing, No rales   Cardiovascular:  Normal S1,S2.  No m/r/g.     Abdomen:Exam: Benign, Soft, non-tender, No masses  Skin:   warm, no rashes, no ecchymosis  Extremities: normal, no cyanosis, clubbing, warm with normal capillary refill.      Rad results: (The following images and results were reviewed by Dr. Stevenson Clinch on 09/23/2016). CTA Chest 09/06/16 CT ANGIOGRAPHY CHEST WITH CONTRAST  TECHNIQUE: Multidetector CT imaging of the chest was performed using the standard protocol during bolus administration of intravenous contrast. Multiplanar CT image reconstructions and MIPs were obtained to evaluate the vascular anatomy.  CONTRAST:  75 cc Isovue 370  IV  COMPARISON:  07/18/2016  FINDINGS: Cardiovascular: Atherosclerotic calcification aorta, coronary arteries, and proximal great vessels. Aorta normal caliber without aneurysm or dissection. Pulmonary arteries well opacified and patent. No evidence of pulmonary embolism. No pericardial effusion.  Mediastinum/Nodes: Esophagus unremarkable. Base of cervical region normal appearance. LEFT hilar mass/ adenopathy, 5.2 x 3.7 cm image 50 previously 3.4 x 3.1 cm. Additional enlarged sub carinal node 11 mm short axis image 51. Additional normal size mediastinal and hilar nodes.  Lungs/Pleura: Minimal scarring in lower lobes bilaterally. Lungs hyperexpanded. Stable RIGHT lung nodule 3 mm diameter image 53. Reported pleural thickening at the posterior RIGHT chest measured on the previous exam is shown to be fat attenuation on soft tissue windows.  Upper Abdomen: Unremarkable  Musculoskeletal: Subcutaneous bilobed nodule posterior upper chest just LEFT of midline 4.3 x 2.1 cm present since at least 08/06/2011 question sebaceous cyst or epidermal inclusion cyst. Diffuse osseous demineralization. Posttraumatic deformities of ribs bilaterally. No acute destructive lesions to suggest metastases.  Review of the MIP images confirms the above findings.  IMPRESSION: No evidence of pulmonary embolism.  Postsurgical changes and scarring within the chest bilaterally with underlying COPD changes.  Interval increase in size of LEFT hilar  mass/adenopathy and a mildly enlarged subcarinal node.  Aortic atherosclerosis and coronary arterial calcification.  CXR 09/22/16     Assessment and Plan:74 yo M with history of stage I adenocarcinoma, sarcoma with metastases to lung, bilateral lower lobe lobectomies, now with new left hilar mass consistent with metastatic sarcoma receiving radiation treatment, being followed for COPD and COPD exacerbation COPD exacerbation ([place]) Patient with  recent COPD exacerbation treated with antibiotics and prednisone taper. May have had recent sick contact and developed another viral upper story tract infection, presented back to the ED last night with significant improvement after DuoNeb treatments. He did have pulmonary function testing and 6 minute walk testing done on 09/20/2016, however these 2 tests were performed in the setting of active radiation therapy along with recent COPD exacerbation giving falsely high suspicion for severe COPD. I do suspect the patient does have underlying COPD and will most likely need to be on some type of inhaler such as a ICS/LABA. Will need repeat PFTs and 6 minute walk test in about 6 months to accurately stage his level of obstruction and restriction.  At this time he is still having bronchospasm in the setting of underlying COPD and will treat as such.  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  Following with hematology/oncology, and radiation oncology. Is currently in process of finishing 2 weeks of consecutive radiation therapy and will have surveillance scanning done in the near future.  Plan: -Continue with plan as outlined by hematology oncology, and radiation oncology  COPD (chronic obstructive pulmonary disease) (HCC) Moderate to severe obstruction with moderate restriction and reduced lung volumes noted on recent primary function testing. Currently not on any inhalers Has a history of prolonged smoking in the past I believe the patient does have some  underlying COPD, however his PFTs and his 6 minute walk test was done in the setting of a recent COPD exacerbation along with active radiation therapy to his lungs for his hilar mass. These findings are significantly elevated for COPD and he will need repeat PFTs and 6 minute walk tests in about 6 months. Again, he does have underlying COPD and will be treated as such, level of obstruction and restriction to be reevaluated with repeat testing as stated earlier.  Plan: -Breo trial for one month -Nebulizer machine with DuoNeb treatment: 1 DuoNeb treatment 4 times a day as needed for shortness of breath/wheezing/recurrent cough -See plan also for COPD exacerbation -Patient educated on the use of rescue inhaler and maintenance inhaler. -Avoid any forms of secondhand smoking, electronic cigarettes, vapors, etc.   Updated Medication List Outpatient Encounter Prescriptions as of 09/23/2016  Medication Sig  . albuterol (PROVENTIL HFA;VENTOLIN HFA) 108 (90 Base) MCG/ACT inhaler Inhale 2 puffs into the lungs every 6 (six) hours as needed for wheezing or shortness of breath.  Marland Kitchen aspirin 325 MG tablet Take 325 mg by mouth at bedtime.   . Cholecalciferol (VITAMIN D-3 PO) Take 1 tablet by mouth daily.   Marland Kitchen doxycycline (VIBRAMYCIN) 100 MG capsule Take 1 capsule (100 mg total) by  mouth 2 (two) times daily.  . Doxylamine Succinate, Sleep, (SLEEP AID PO) Take 1 tablet by mouth at bedtime.  . Omega-3 Fatty Acids (FISH OIL) 1000 MG CAPS Take 1 capsule by mouth daily.  . predniSONE (DELTASONE) 20 MG tablet Take 3 tablets (60 mg total) by mouth daily.  . [DISCONTINUED] benzonatate (TESSALON PERLES) 100 MG capsule Take 1 capsule (100 mg total) by mouth 3 (three) times daily as needed for cough.  . [DISCONTINUED] levofloxacin (LEVAQUIN) 750 MG tablet Take 1 tablet (750 mg total) by mouth daily.  . [DISCONTINUED] predniSONE (DELTASONE) 20 MG tablet Take 3 tablets (60 mg total) by mouth daily.   No facility-administered  encounter medications on file as of 09/23/2016.     Orders for this visit: Orders Placed This Encounter  Procedures  . AMB REFERRAL FOR DME    Referral Priority:   Routine    Referral Type:   Durable Medical Equipment Purchase    Number of Visits Requested:   1    Thank  you for the visitation and for allowing  [facility] to assist in the care of your patient. Our recommendations are noted above.  Please contact us if we can be of further service.  Vilinda Boehringer, MD [facility] Office Number: [phone]  Note: This note was prepared with Dragon dictation along with smaller phrase technology. Any transcriptional errors that result from this process are unintentional.

## 2016-09-23 NOTE — Assessment & Plan Note (Addendum)
Moderate to severe obstruction with moderate restriction and reduced lung volumes noted on recent primary function testing. Currently not on any inhalers Has a history of prolonged smoking in the past I believe the patient does have some underlying COPD, however his PFTs and his 6 minute walk test was done in the setting of a recent COPD exacerbation along with active radiation therapy to his lungs for his hilar mass. These findings are significantly elevated for COPD and he will need repeat PFTs and 6 minute walk tests in about 6 months. Again, he does have underlying COPD and will be treated as such, level of obstruction and restriction to be reevaluated with repeat testing as stated earlier.  Plan: -Breo trial for one month -Nebulizer machine with DuoNeb treatment: 1 DuoNeb treatment 4 times a day as needed for shortness of breath/wheezing/recurrent cough -See plan also for COPD exacerbation -Patient educated on the use of rescue inhaler and maintenance inhaler. -Avoid any forms of secondhand smoking, electronic cigarettes, vapors, etc.

## 2016-09-23 NOTE — Assessment & Plan Note (Signed)
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  Following with hematology/oncology, and radiation oncology. Is currently in process of finishing 2 weeks of consecutive radiation therapy and will have surveillance scanning done in the near future.  Plan: -Continue with plan as outlined by hematology oncology, and radiation oncology

## 2016-09-23 NOTE — Patient Instructions (Addendum)
Follow up with Dr. Stevenson Clinch in:6 weeks -Prednisone 20 mg, 1 tab daily 5 days -Z-Pak, use as directed -1 nebulizer machine -Incentive spirometer 10-15 times daily -DuoNeb treatment (use at home) - 1 treatment 3 times a day for the next 3 days, then use only as needed for shortness of breath/wheezing/recurrent cough every 6 hours - albuterol inhaler (use when not at home) - 2puff every 3-4 hours as needed for shortness of breath\wheezing\recurrent cough breath\wheezing\recurrent cough - Breo 200/25-1 puff daily, gargle and rinse after each use-one month trial, patient will call us back for prescription if there is moderate improvement

## 2016-09-23 NOTE — Assessment & Plan Note (Signed)
Patient with recent COPD exacerbation treated with antibiotics and prednisone taper. May have had recent sick contact and developed another viral upper story tract infection, presented back to the ED last night with significant improvement after DuoNeb treatments. He did have pulmonary function testing and 6 minute walk testing done on 09/20/2016, however these 2 tests were performed in the setting of active radiation therapy along with recent COPD exacerbation giving falsely high suspicion for severe COPD. I do suspect the patient does have underlying COPD and will most likely need to be on some type of inhaler such as a ICS/LABA. Will need repeat PFTs and 6 minute walk test in about 6 months to accurately stage his level of obstruction and restriction.  At this time he is still having bronchospasm in the setting of underlying COPD and will treat as such.  Plan: Prednisone 5 days, 20 mg per day Z-Pak DuoNeb 1 treatment 3 times a day 3 days then as needed 4 times a day Albuterol inhaler as needed 4 times a day Incentive spirometry 10-15 times daily Breo 200/25-1 puff daily, gargle and rinse after each use-one month trial, patient will call us back for prescription if there is moderate improvement

## 2016-09-23 NOTE — Telephone Encounter (Signed)
Spoke to [employer] and gave verbal per VM to proceed with rx for duoneb without prn in sig. Nothing further needed.

## 2016-09-23 NOTE — Progress Notes (Signed)
Patient ID: Stephen [name], male   DOB: 01-13-1941, 75 y.o.   MRN: [id number] Patient seen in the office today and instructed on use of Breo Ellipta.  Patient expressed understanding and demonstrated technique.

## 2016-09-23 NOTE — Telephone Encounter (Signed)
Need clarification on Albuterol solution. Please call.

## 2016-10-22 ENCOUNTER — Ambulatory Visit
Admission: RE | Admit: 2016-10-22 | Discharge: 2016-10-22 | Disposition: A | Discharge status: Home / Self Care | Payer: Medicare Other | Source: Ambulatory Visit | Attending: Family Medicine | Admitting: Family Medicine

## 2016-10-22 ENCOUNTER — Other Ambulatory Visit: Payer: Self-pay | Admitting: Family Medicine

## 2016-10-22 DIAGNOSIS — M7989 Other specified soft tissue disorders: Secondary | ICD-10-CM | POA: Diagnosis present

## 2016-10-23 ENCOUNTER — Inpatient Hospital Stay: Payer: Medicare Other | Attending: Internal Medicine | Admitting: Internal Medicine

## 2016-10-23 ENCOUNTER — Ambulatory Visit: Payer: Medicare Other

## 2016-10-23 ENCOUNTER — Encounter: Payer: Self-pay | Admitting: Radiation Oncology

## 2016-10-23 ENCOUNTER — Ambulatory Visit
Admission: RE | Admit: 2016-10-23 | Discharge: 2016-10-23 | Disposition: A | Discharge status: Home / Self Care | Payer: Medicare Other | Source: Ambulatory Visit | Attending: Radiation Oncology | Admitting: Radiation Oncology

## 2016-10-23 ENCOUNTER — Other Ambulatory Visit: Payer: Self-pay | Admitting: *Deleted

## 2016-10-23 VITALS — BP 121/78 | HR 85 | Temp 96.5°F | Wt 226.7 lb

## 2016-10-23 VITALS — BP 147/77 | HR 72 | Temp 97.9°F | Resp 18 | Wt 226.6 lb

## 2016-10-23 DIAGNOSIS — Z87891 Personal history of nicotine dependence: Secondary | ICD-10-CM | POA: Diagnosis not present

## 2016-10-23 DIAGNOSIS — C3402 Malignant neoplasm of left main bronchus: Secondary | ICD-10-CM | POA: Insufficient documentation

## 2016-10-23 DIAGNOSIS — I2510 Atherosclerotic heart disease of native coronary artery without angina pectoris: Secondary | ICD-10-CM | POA: Diagnosis not present

## 2016-10-23 DIAGNOSIS — C499 Malignant neoplasm of connective and soft tissue, unspecified: Secondary | ICD-10-CM | POA: Diagnosis not present

## 2016-10-23 DIAGNOSIS — C3412 Malignant neoplasm of upper lobe, left bronchus or lung: Secondary | ICD-10-CM | POA: Diagnosis not present

## 2016-10-23 DIAGNOSIS — Z79899 Other long term (current) drug therapy: Secondary | ICD-10-CM | POA: Insufficient documentation

## 2016-10-23 DIAGNOSIS — I252 Old myocardial infarction: Secondary | ICD-10-CM | POA: Diagnosis not present

## 2016-10-23 DIAGNOSIS — M7989 Other specified soft tissue disorders: Secondary | ICD-10-CM

## 2016-10-23 DIAGNOSIS — Z923 Personal history of irradiation: Secondary | ICD-10-CM | POA: Insufficient documentation

## 2016-10-23 DIAGNOSIS — Z87820 Personal history of traumatic brain injury: Secondary | ICD-10-CM | POA: Insufficient documentation

## 2016-10-23 DIAGNOSIS — J449 Chronic obstructive pulmonary disease, unspecified: Secondary | ICD-10-CM | POA: Diagnosis not present

## 2016-10-23 NOTE — Progress Notes (Signed)
[place] OFFICE PROGRESS NOTE  Patient Care Team: No Pcp Per Patient as PCP - General (General Practice)  No matching staging information was found for the patient.   Oncology History   # 2007-  Carcinoma of lung adenocarcinoma. T2N0M0 tumor. Status-post right thoracotomy and upper lobectomy. Stage I-B.  #  Diagnosis of sarcoma.  Left thigh (at [facility], [facility]    neo adjuvant radiation  and surgery ,  In November of 2011.pathology report shows poorly differentiated sarcoma, high grade.  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 [Dr.Oaks]  # AUG 2017- Recurrent sarcoma- Start RT [sep 2017; finished 10./01/2016]Left Hilar Mass Bx [Dr.kasa];    3. 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     Lung cancer ([place])   07/16/2015 Initial Diagnosis    Lung cancer       Malignant neoplasm of upper lobe, left bronchus or lung ([place])      INTERVAL HISTORY:  Stephen [name] 75 y.o.  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  Patient complains of swelling in the left lower extremity for the last 2 weeks. He denies any pain. DOPPLER WAS NEGATIVE.   Patient denies any unusual shortness of breath. Denies any cough; no hemoptysis.  Otherwise denies any new lumps or bumps. Appetite is good. No weight loss. Denies any headaches.    REVIEW OF SYSTEMS:  A complete 10 point review of system is done which is negative except mentioned above/history of present illness.   PAST MEDICAL HISTORY :  Past Medical History:  Diagnosis Date  . Cancer ([place])    Lung CA- Bilateral  . Coronary artery disease   . Lung cancer ([place]) 07/16/2015  . Myocardial infarction    coronary stent  . Shortness of breath dyspnea    . Stroke [facility])     PAST SURGICAL HISTORY :   Past Surgical History:  Procedure Laterality Date  . CAROTID ENDARTERECTOMY Right   . ENDOBRONCHIAL ULTRASOUND N/A 08/08/2016   Procedure: ENDOBRONCHIAL ULTRASOUND;  Surgeon: Vilinda Boehringer, MD;  Location: [facility] ORS;  Service: Cardiopulmonary;  Laterality: N/A;  . SKIN CANCER EXCISION Left   . THORACOTOMY Bilateral    Lobectomy left lower lobe/sarcoma resection.   FAMILY HISTORY :  No family history on file. Family history of cancer/lung  SOCIAL HISTORY:  Currently not smoking. Social History  Substance Use Topics  . Smoking status: Former Smoker    Quit date: 07/31/2003  . Smokeless tobacco: Never Used  . Alcohol use No    ALLERGIES:  has No Known Allergies.  MEDICATIONS:  Current Outpatient Prescriptions  Medication Sig Dispense Refill  . albuterol (PROVENTIL HFA;VENTOLIN HFA) 108 (90 Base) MCG/ACT inhaler Inhale 2 puffs into the lungs every 6 (six) hours as needed for wheezing or shortness of breath. 1 Inhaler 0  . AMBULATORY NON FORMULARY MEDICATION Medication Name: incentive spirometer 1 each 0  . aspirin 325 MG tablet Take 325 mg by mouth at bedtime.     . Cholecalciferol (VITAMIN D-3 PO) Take 1 tablet by mouth daily.     Marland Kitchen doxycycline (VIBRAMYCIN) 100 MG capsule Take 1 capsule (100 mg total) by mouth 2 (two) times daily. 14 capsule 0  .  Doxylamine Succinate, Sleep, (SLEEP AID PO) Take 1 tablet by mouth at bedtime.    Marland Kitchen ipratropium-albuterol (DUONEB) 0.5-2.5 (3) MG/3ML SOLN Take 3 mLs by nebulization every 6 (six) hours as needed. 360 mL 3  . Omega-3 Fatty Acids (FISH OIL) 1000 MG CAPS Take 1 capsule by mouth daily.    . predniSONE (DELTASONE) 20 MG tablet Take 3 tablets (60 mg total) by mouth daily. 15 tablet 0  . predniSONE (DELTASONE) 20 MG tablet Take 1 tablet (20 mg total) by mouth daily. 5 tablet 0  . TURMERIC PO Take by mouth.     No current facility-administered medications for this visit.     PHYSICAL  EXAMINATION: ECOG PERFORMANCE STATUS: 0 - Asymptomatic  BP (!) 147/77 (BP Location: Left Arm, Patient Position: Sitting)   Pulse 72   Temp 97.9 F (36.6 C) (Tympanic)   Resp 18   Wt 226 lb 9.6 oz (102.8 kg)   SpO2 93%   BMI 29.09 kg/m   Filed Weights   10/23/16 1521  Weight: 226 lb 9.6 oz (102.8 kg)    GENERAL: Well-nourished well-developed; Alert, no distress and comfortable.   Accompanied by his wife. EYES: no pallor or icterus OROPHARYNX: no thrush or ulceration; good dentition  NECK: supple, no masses felt LYMPH:  no palpable lymphadenopathy in the cervical, axillary or inguinal regions LUNGS: clear to auscultation and  No wheeze or crackles HEART/CVS: regular rate & rhythm and no murmurs; LEFT lower extremity edema 3+.  ABDOMEN:abdomen soft, non-tender and normal bowel sounds Musculoskeletal:no cyanosis of digits and no clubbing  PSYCH: alert & oriented x 3 with fluent speech NEURO: no focal motor/sensory deficits SKIN:  no rashes or significant lesions  LABORATORY DATA:  I have reviewed the data as listed    Component Value Date/Time   NA 135 09/22/2016 2138   NA 138 01/03/2015 0942   K 3.6 09/22/2016 2138   K 4.8 01/03/2015 0942   CL 102 09/22/2016 2138   CL 100 01/03/2015 0942   CO2 22 09/22/2016 2138   CO2 30 01/03/2015 0942   GLUCOSE 158 (H) 09/22/2016 2138   GLUCOSE 117 (H) 01/03/2015 0942   BUN 19 09/22/2016 2138   BUN 14 01/03/2015 0942   CREATININE 1.10 09/22/2016 2138   CREATININE 1.05 01/03/2015 0942   CALCIUM 8.5 (L) 09/22/2016 2138   CALCIUM 9.2 01/03/2015 0942   PROT 7.6 07/08/2016 1057   PROT 7.8 01/03/2015 0942   ALBUMIN 3.9 07/08/2016 1057   ALBUMIN 4.0 01/03/2015 0942   AST 20 07/08/2016 1057   AST 24 01/03/2015 0942   ALT 15 (L) 07/08/2016 1057   ALT 34 01/03/2015 0942   ALKPHOS 64 07/08/2016 1057   ALKPHOS 73 01/03/2015 0942   BILITOT 0.7 07/08/2016 1057   BILITOT 0.6 01/03/2015 0942   GFRNONAA >60 09/22/2016 2138   GFRNONAA >60  01/03/2015 0942   GFRNONAA >60 06/29/2014 0947   GFRAA >60 09/22/2016 2138   GFRAA >60 01/03/2015 0942   GFRAA >60 06/29/2014 0947    No results found for: SPEP, UPEP  Lab Results  Component Value Date   WBC 6.3 09/22/2016   NEUTROABS 5.0 09/22/2016   HGB 14.9 09/22/2016   HCT 43.7 09/22/2016   MCV 85.0 09/22/2016   PLT 183 09/22/2016      Chemistry      Component Value Date/Time   NA 135 09/22/2016 2138   NA 138 01/03/2015 0942   K 3.6 09/22/2016 2138   K 4.8  01/03/2015 0942   CL 102 09/22/2016 2138   CL 100 01/03/2015 0942   CO2 22 09/22/2016 2138   CO2 30 01/03/2015 0942   BUN 19 09/22/2016 2138   BUN 14 01/03/2015 0942   CREATININE 1.10 09/22/2016 2138   CREATININE 1.05 01/03/2015 0942      Component Value Date/Time   CALCIUM 8.5 (L) 09/22/2016 2138   CALCIUM 9.2 01/03/2015 0942   ALKPHOS 64 07/08/2016 1057   ALKPHOS 73 01/03/2015 0942   AST 20 07/08/2016 1057   AST 24 01/03/2015 0942   ALT 15 (L) 07/08/2016 1057   ALT 34 01/03/2015 0942   BILITOT 0.7 07/08/2016 1057   BILITOT 0.6 01/03/2015 0942       RADIOGRAPHIC STUDIES: I have personally reviewed the radiological images as listed and agreed with the findings in the report. US Venous Img Lower Unilateral Left  Result Date: 10/22/2016 CLINICAL DATA:  Left leg swelling and edema for 2 weeks EXAM: Left LOWER EXTREMITY VENOUS DOPPLER ULTRASOUND TECHNIQUE: Gray-scale sonography with graded compression, as well as color Doppler and duplex ultrasound were performed to evaluate the lower extremity deep venous systems from the level of the common femoral vein and including the common femoral, femoral, profunda femoral, popliteal and calf veins including the posterior tibial, peroneal and gastrocnemius veins when visible. The superficial great saphenous vein was also interrogated. Spectral Doppler was utilized to evaluate flow at rest and with distal augmentation maneuvers in the common femoral, femoral and  popliteal veins. COMPARISON:  None. FINDINGS: Contralateral Common Femoral Vein: Respiratory phasicity is normal and symmetric with the symptomatic side. No evidence of thrombus. Normal compressibility. Common Femoral Vein: No evidence of thrombus. Normal compressibility, respiratory phasicity and response to augmentation. Saphenofemoral Junction: No evidence of thrombus. Normal compressibility and flow on color Doppler imaging. Profunda Femoral Vein: No evidence of thrombus. Normal compressibility and flow on color Doppler imaging. Femoral Vein: No evidence of thrombus. Normal compressibility, respiratory phasicity and response to augmentation. Popliteal Vein: No evidence of thrombus. Normal compressibility, respiratory phasicity and response to augmentation. Calf Veins: Limited evaluation due to calf edema. Posterior tibial and peroneal veins are not well visualized. Superficial Great Saphenous Vein: No evidence of thrombus. Normal compressibility and flow on color Doppler imaging. Venous Reflux:  None. Other Findings: Moderate subcutaneous edema within the calf soft tissues. IMPRESSION: 1. No evidence for acute DVT from the left groin to the popliteal fossa. Limited evaluation of calf veins due to significant edema. Electronically Signed   By: Donavan Foil M.D.   On: 10/22/2016 17:26     ASSESSMENT & PLAN:  Malignant neoplasm of upper lobe, left bronchus or lung ([facility]) # Recurrent sarcoma of the left hilum- biopsy proven s/p RT appx 4 weeks ago. Repeat chest CT in 2 months.   # Sarcoma of the thigh- metastatic to the lung status post resection.  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 Clinically no evidence of  recurrence noted.   # Left LE swelling- dopplers-NEG. Discussed with radiology; Dr.Lukens; recommend CTV of LLE? extrinsic compression by inguinal hernia. If true will evaluation with surgery.   # will plan CT of chest in 2 months or so and follow up/ Jan 2018.     Orders Placed This Encounter  Procedures  . CT ANGIO LOWER EXT BILAT W &/OR WO CONTRAST    Standing Status:   Future    Standing Expiration Date:   12/23/2017    Order Specific Question:  If indicated for the ordered procedure, I authorize the administration of contrast media per Radiology protocol    Answer:   Yes    Order Specific Question:   Reason for Exam (SYMPTOM  OR DIAGNOSIS REQUIRED)    Answer:   leg swelling    Order Specific Question:   Preferred imaging location?    Answer:   [facility]  . CT CHEST W CONTRAST    Standing Status:   Future    Standing Expiration Date:   12/23/2017    Order Specific Question:   Reason for Exam (SYMPTOM  OR DIAGNOSIS REQUIRED)    Answer:   lung cancer    Order Specific Question:   Preferred imaging location?    Answer:   [facility]   All questions were answered. The patient knows to call the clinic with any problems, questions or concerns.      Cammie Sickle, MD 10/23/2016 4:56 PM

## 2016-10-23 NOTE — Assessment & Plan Note (Addendum)
#   Recurrent sarcoma of the left hilum- biopsy proven s/p RT appx 4 weeks ago. Repeat chest CT in 2 months.   # Sarcoma of the thigh- metastatic to the lung status post resection.  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 Clinically no evidence of  recurrence noted.   # Left LE swelling- dopplers-NEG. Discussed with radiology; Dr.Lukens; recommend CTV of LLE? extrinsic compression by inguinal hernia. If true will evaluation with surgery.   # will plan CT of chest in 2 months or so and follow up/ Jan 2018.

## 2016-10-23 NOTE — Progress Notes (Signed)
Radiation Oncology Follow up Note  Name: Stephen [name]   Date:   10/23/2016 MRN:  [id number] DOB: 05-27-1941    This 75 y.o. male presents to the clinic today for one-month follow-up status post hypofractionated course of radiation therapy to his left hilum for metastatic pleomorphic sarcoma met.  REFERRING PROVIDER: No ref. provider found  HPI: Patient is a 75 year old male now one month out having completed hypofractionated course of radiation therapy to his left hilar region for metastatic high-grade pleomorphic sarcoma metastasis. He is unfortunately had several admissions to the hospital on the past month for exacerbation of COPD. Pulmonology has placed him on nebulizers, inhalers and. Previous he treated him with antibiotic therapy and prednisone taper. He is seen today and doing fairly well. He states his breathing has improved significantly. Specifically denies cough hemoptysis or chest tightness.  COMPLICATIONS OF TREATMENT: none  FOLLOW UP COMPLIANCE: keeps appointments   PHYSICAL EXAM:  BP 121/78   Pulse 85   Temp (!) 96.5 F (35.8 C)   Wt 226 lb 11.9 oz (102.9 kg)   BMI 29.11 kg/m  Well-developed well-nourished patient in NAD. HEENT reveals PERLA, EOMI, discs not visualized.  Oral cavity is clear. No oral mucosal lesions are identified. Neck is clear without evidence of cervical or supraclavicular adenopathy. Lungs are clear to A&P. Cardiac examination is essentially unremarkable with regular rate and rhythm without murmur rub or thrill. Abdomen is benign with no organomegaly or masses noted. Motor sensory and DTR levels are equal and symmetric in the upper and lower extremities. Cranial nerves II through XII are grossly intact. Proprioception is intact. No peripheral adenopathy or edema is identified. No motor or sensory levels are noted. Crude visual fields are within normal range.  RADIOLOGY RESULTS: Hospital plain films and recent CT scans are reviewed.  PLAN: Present time  patient is stable. He is seeing medical oncologist afternoon I assume a follow-up CT scan will be ordered which I will review when it ballotable. Otherwise he continues on his inhalers nebulizer which seems to be controlling his significant COPD. I have asked to see him back in 3-4 months for follow-up. He continues close follow-up care with medical oncology.  I would like to take this opportunity to thank you for allowing me to participate in the care of your patient.Armstead Peaks., MD

## 2016-10-23 NOTE — Progress Notes (Signed)
Patient is here today for follow up, he mentions he has no appetite.

## 2016-10-24 ENCOUNTER — Ambulatory Visit (INDEPENDENT_AMBULATORY_CARE_PROVIDER_SITE_OTHER): Payer: Medicare Other | Admitting: Internal Medicine

## 2016-10-24 ENCOUNTER — Encounter: Payer: Self-pay | Admitting: Internal Medicine

## 2016-10-24 ENCOUNTER — Other Ambulatory Visit: Payer: Self-pay | Admitting: *Deleted

## 2016-10-24 ENCOUNTER — Other Ambulatory Visit: Payer: Self-pay | Admitting: Internal Medicine

## 2016-10-24 ENCOUNTER — Other Ambulatory Visit: Payer: Self-pay | Admitting: Oncology

## 2016-10-24 VITALS — BP 118/66 | HR 89 | Wt 225.0 lb

## 2016-10-24 DIAGNOSIS — R05 Cough: Secondary | ICD-10-CM | POA: Diagnosis not present

## 2016-10-24 DIAGNOSIS — R918 Other nonspecific abnormal finding of lung field: Secondary | ICD-10-CM | POA: Diagnosis not present

## 2016-10-24 DIAGNOSIS — J432 Centrilobular emphysema: Secondary | ICD-10-CM | POA: Diagnosis not present

## 2016-10-24 DIAGNOSIS — J449 Chronic obstructive pulmonary disease, unspecified: Secondary | ICD-10-CM | POA: Diagnosis not present

## 2016-10-24 DIAGNOSIS — M7989 Other specified soft tissue disorders: Secondary | ICD-10-CM

## 2016-10-24 DIAGNOSIS — R059 Cough, unspecified: Secondary | ICD-10-CM | POA: Insufficient documentation

## 2016-10-24 NOTE — Patient Instructions (Signed)
Follow up with Dr. Mortimer Fries in 6 weeks - stop Breo - we will give you a 6 week trial of Bevespi (97mg/4.8mcg) - 2 puff in the Am and 2 puff in the evening. -gargle and rinse after each use.  - continue to avoid tobacco - referral to Pulmonary Rehab - Pepcid ('20mg'$ ) - 1 tab twice a day x 6 weeks.

## 2016-10-24 NOTE — Progress Notes (Signed)
[facility] Consultation      MRN# [id number] Stephen [name] 23-Jan-1941   CC: Chief Complaint  Patient presents with  . 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 Treating for COPD   Events since last clinic visit: Patient presents today for follow-up visit of his COPD, left hilar mass. Since his last visit he has not had any further ER admissions or urgent care admissions. At his last visit he was treated for another episode of COPD exacerbation. For his left hilar mass, sarcoma recurrence, he has completed his radiation therapy as of 09/23/2016. Since then he's had a chronic cough, nonproductive, mostly related flat, mostly at night. He also endorses dyspnea on exertion with any type of incline. Overall he states that he is doing well, he is not around any smokers. He has a repeat CT chest on December 29 and follow-up with hematology/oncology on 12/26/2016. Given the level of lung injury he's had for radiation, former lung resection resection, recent COPD exacerbation/bronchitis, recurrence of sarcoma, we have decided to repeat his pulmonary function tests and 6 minute walk test in April 2018.     Current Outpatient Prescriptions:  .  albuterol (PROVENTIL HFA;VENTOLIN HFA) 108 (90 Base) MCG/ACT inhaler, Inhale 2 puffs into the lungs every 6 (six) hours as needed for wheezing or shortness of breath., Disp: 1 Inhaler, Rfl: 0 .  AMBULATORY NON FORMULARY MEDICATION, Medication Name: incentive spirometer, Disp: 1 each, Rfl: 0 .  aspirin 325 MG tablet, Take 325 mg by mouth at bedtime. , Disp: , Rfl:  .  Cholecalciferol (VITAMIN D-3 PO),  Take 1 tablet by mouth daily. , Disp: , Rfl:  .  Doxylamine Succinate, Sleep, (SLEEP AID PO), Take 1 tablet by mouth at bedtime., Disp: , Rfl:  .  ipratropium-albuterol (DUONEB) 0.5-2.5 (3) MG/3ML SOLN, Take 3 mLs by nebulization every 6 (six) hours as needed., Disp: 360 mL, Rfl: 3 .  Omega-3 Fatty Acids (FISH OIL) 1000 MG CAPS, Take 1 capsule by mouth daily., Disp: , Rfl:  .  TURMERIC PO, Take by mouth., Disp: , Rfl:    Review of Systems  Constitutional: Negative for chills and fever.  Eyes: Negative for blurred vision and double vision.  Respiratory: Positive for cough and shortness of breath. Negative for hemoptysis, sputum production and wheezing.   Gastrointestinal: Negative for heartburn and nausea.  Genitourinary: Negative for dysuria.  Musculoskeletal: Negative for myalgias.  Skin: Negative for rash.  Neurological: Negative for dizziness and headaches.  Endo/Heme/Allergies: Does not bruise/bleed easily.  Psychiatric/Behavioral: Negative for depression.      Allergies:  Review of patient's allergies indicates no known allergies.  Physical Examination:  VS: BP 118/66 (BP Location: Right Arm, Cuff Size: Normal)   Pulse 89   Wt 225 lb (102.1 kg)   SpO2 95%   BMI 28.89 kg/m   General Appearance: No distress  HEENT: PERRLA, no ptosis, no other lesions noticed Pulmonary:normal breath sounds., diaphragmatic excursion normal.No wheezing, No rales   Cardiovascular:  Normal S1,S2.  No m/r/g.     Abdomen:Exam: Benign, Soft, non-tender, No masses  Skin:   warm, no rashes, no ecchymosis  Extremities: normal, no cyanosis, clubbing,  warm with normal capillary refill.      Rad results: (The following images and results were reviewed by Dr. Stevenson Clinch on 10/24/2016). CTA Chest 09/06/16 CT ANGIOGRAPHY CHEST WITH CONTRAST  TECHNIQUE: Multidetector CT imaging of the chest was performed using the standard protocol during bolus administration of intravenous contrast. Multiplanar CT image  reconstructions and MIPs were obtained to evaluate the vascular anatomy.  CONTRAST:  75 cc Isovue 370 IV  COMPARISON:  07/18/2016  FINDINGS: Cardiovascular: Atherosclerotic calcification aorta, coronary arteries, and proximal great vessels. Aorta normal caliber without aneurysm or dissection. Pulmonary arteries well opacified and patent. No evidence of pulmonary embolism. No pericardial effusion.  Mediastinum/Nodes: Esophagus unremarkable. Base of cervical region normal appearance. LEFT hilar mass/ adenopathy, 5.2 x 3.7 cm image 50 previously 3.4 x 3.1 cm. Additional enlarged sub carinal node 11 mm short axis image 51. Additional normal size mediastinal and hilar nodes.  Lungs/Pleura: Minimal scarring in lower lobes bilaterally. Lungs hyperexpanded. Stable RIGHT lung nodule 3 mm diameter image 53. Reported pleural thickening at the posterior RIGHT chest measured on the previous exam is shown to be fat attenuation on soft tissue windows.  Upper Abdomen: Unremarkable  Musculoskeletal: Subcutaneous bilobed nodule posterior upper chest just LEFT of midline 4.3 x 2.1 cm present since at least 08/06/2011 question sebaceous cyst or epidermal inclusion cyst. Diffuse osseous demineralization. Posttraumatic deformities of ribs bilaterally. No acute destructive lesions to suggest metastases.  Review of the MIP images confirms the above findings.  IMPRESSION: No evidence of pulmonary embolism.  Postsurgical changes and scarring within the chest bilaterally with underlying COPD changes.  Interval increase in size of LEFT hilar mass/adenopathy and a mildly enlarged subcarinal node.  Aortic atherosclerosis and coronary arterial calcification.  CXR 09/22/16     Assessment and Plan:74 yo M with history of stage I adenocarcinoma, sarcoma with metastases to lung, bilateral lower lobe lobectomies, now with new left hilar mass consistent with metastatic sarcoma receiving  radiation treatment, being followed for COPD and now cough with DOE Cough Multifactorial: COPD, recent radiation, recent COPD exacerbation  At this time his cough is most likely due to some post radiation injury along with his recent COPD exacerbation, it is mostly with laying flat and at night. This may be due to some mild acid reflux, therefore we will give him a 6 week course of H2 blocker.  Plan: -Continue with COPD treatment -6 weeks of Pepcid (20 mg, 1 tab twice a day)   Hilar mass Left Hilar mass - s/p EBUS on 07/2016 with FNA - - MORPHOLOGICALLY CONSISTENT WITH METASTATIC PLEOMORPHIC HIGH-GRADE SARCOMA.  Following with hematology/oncology, and radiation oncology.  Completed last radiation therapy session on 09/23/2016. 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 No worsening of symptoms at this time except a residual cough.  He has a follow-up CT chest with contrast on December 29, and then follow-up with hematology/oncology on 12/26/2016.  Plan: -Continue with plan as outlined by hematology oncology, and radiation oncology  COPD (chronic obstructive pulmonary disease) (HCC) Moderate to severe obstruction (COPD Stage 3) with moderate restriction and reduced lung volumes noted on recent primary function testing. Has a history of prolonged smoking in the past I believe the patient does have some underlying COPD, however his PFTs and his 6 minute walk test was done in the setting of a recent COPD exacerbation along with active radiation therapy to his lungs for his hilar mass. These findings are significantly elevated for  COPD and he will need repeat PFTs and 6 minute walk tests in about 6 months (April 2018).  Again, he does have underlying COPD and will be treated as such, level of obstruction and restriction to be reevaluated with repeat testing as stated earlier. Today, his COPD, deconditioning, recent radiation, is also  adding to his dyspnea on exertion, and I believe at this time he is a good candidate for pulmonary rehabilitation.   Plan: -Bevespi trial for 6 weeks -Nebulizer machine with DuoNeb treatment: 1 DuoNeb treatment 4 times a day as needed for shortness of breath/wheezing/recurrent cough -Patient educated on the use of rescue inhaler and maintenance inhaler. -Avoid any forms of secondhand smoking, electronic cigarettes, vapors, etc. -Referral to pulmonary rehabilitation -Repeat PFTs and 6 minute walk test in April 2018   Updated Medication List Outpatient Encounter Prescriptions as of 10/24/2016  Medication Sig  . albuterol (PROVENTIL HFA;VENTOLIN HFA) 108 (90 Base) MCG/ACT inhaler Inhale 2 puffs into the lungs every 6 (six) hours as needed for wheezing or shortness of breath.  . AMBULATORY NON FORMULARY MEDICATION Medication Name: incentive spirometer  . aspirin 325 MG tablet Take 325 mg by mouth at bedtime.   . Cholecalciferol (VITAMIN D-3 PO) Take 1 tablet by mouth daily.   . Doxylamine Succinate, Sleep, (SLEEP AID PO) Take 1 tablet by mouth at bedtime.  Marland Kitchen ipratropium-albuterol (DUONEB) 0.5-2.5 (3) MG/3ML SOLN Take 3 mLs by nebulization every 6 (six) hours as needed.  . Omega-3 Fatty Acids (FISH OIL) 1000 MG CAPS Take 1 capsule by mouth daily.  . TURMERIC PO Take by mouth.  . [DISCONTINUED] doxycycline (VIBRAMYCIN) 100 MG capsule Take 1 capsule (100 mg total) by mouth 2 (two) times daily.  . [DISCONTINUED] predniSONE (DELTASONE) 20 MG tablet Take 3 tablets (60 mg total) by mouth daily.  . [DISCONTINUED] predniSONE (DELTASONE) 20 MG tablet Take 1 tablet (20 mg total) by mouth daily.   No facility-administered encounter medications on file as of 10/24/2016.     Orders for this visit: No orders of the defined types were placed in this encounter.   Thank  you for the visitation and for allowing  [facility] to assist in the care of your patient. Our recommendations are  noted above.  Please contact us if we can be of further service.  Vilinda Boehringer, MD [facility] Office Number: [phone]  Note: This note was prepared with Dragon dictation along with smaller phrase technology. Any transcriptional errors that result from this process are unintentional.

## 2016-10-24 NOTE — Assessment & Plan Note (Signed)
Moderate to severe obstruction (COPD Stage 3) with moderate restriction and reduced lung volumes noted on recent primary function testing. Has a history of prolonged smoking in the past I believe the patient does have some underlying COPD, however his PFTs and his 6 minute walk test was done in the setting of a recent COPD exacerbation along with active radiation therapy to his lungs for his hilar mass. These findings are significantly elevated for COPD and he will need repeat PFTs and 6 minute walk tests in about 6 months (April 2018).  Again, he does have underlying COPD and will be treated as such, level of obstruction and restriction to be reevaluated with repeat testing as stated earlier. Today, his COPD, deconditioning, recent radiation, is also adding to his dyspnea on exertion, and I believe at this time he is a good candidate for pulmonary rehabilitation.   Plan: -Bevespi trial for 6 weeks -Nebulizer machine with DuoNeb treatment: 1 DuoNeb treatment 4 times a day as needed for shortness of breath/wheezing/recurrent cough -Patient educated on the use of rescue inhaler and maintenance inhaler. -Avoid any forms of secondhand smoking, electronic cigarettes, vapors, etc. -Referral to pulmonary rehabilitation -Repeat PFTs and 6 minute walk test in April 2018

## 2016-10-24 NOTE — Assessment & Plan Note (Signed)
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  Following with hematology/oncology, and radiation oncology.  Completed last radiation therapy session on 09/23/2016. 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 No worsening of symptoms at this time except a residual cough.  He has a follow-up CT chest with contrast on December 29, and then follow-up with hematology/oncology on 12/26/2016.  Plan: -Continue with plan as outlined by hematology oncology, and radiation oncology

## 2016-10-24 NOTE — Assessment & Plan Note (Signed)
Multifactorial: COPD, recent radiation, recent COPD exacerbation  At this time his cough is most likely due to some post radiation injury along with his recent COPD exacerbation, it is mostly with laying flat and at night. This may be due to some mild acid reflux, therefore we will give him a 6 week course of H2 blocker.  Plan: -Continue with COPD treatment -6 weeks of Pepcid (20 mg, 1 tab twice a day)

## 2016-10-29 ENCOUNTER — Other Ambulatory Visit: Payer: Self-pay | Admitting: *Deleted

## 2016-10-29 DIAGNOSIS — J449 Chronic obstructive pulmonary disease, unspecified: Secondary | ICD-10-CM

## 2016-10-30 ENCOUNTER — Other Ambulatory Visit: Payer: Self-pay | Admitting: *Deleted

## 2016-10-30 ENCOUNTER — Ambulatory Visit
Admission: RE | Admit: 2016-10-30 | Discharge: 2016-10-30 | Disposition: A | Discharge status: Home / Self Care | Payer: Medicare Other | Source: Ambulatory Visit | Attending: Internal Medicine | Admitting: Internal Medicine

## 2016-10-30 DIAGNOSIS — Z9889 Other specified postprocedural states: Secondary | ICD-10-CM | POA: Diagnosis not present

## 2016-10-30 DIAGNOSIS — I513 Intracardiac thrombosis, not elsewhere classified: Secondary | ICD-10-CM | POA: Diagnosis not present

## 2016-10-30 DIAGNOSIS — Z95820 Peripheral vascular angioplasty status with implants and grafts: Secondary | ICD-10-CM | POA: Diagnosis not present

## 2016-10-30 DIAGNOSIS — N210 Calculus in bladder: Secondary | ICD-10-CM | POA: Diagnosis not present

## 2016-10-30 DIAGNOSIS — M7989 Other specified soft tissue disorders: Secondary | ICD-10-CM

## 2016-10-30 DIAGNOSIS — I708 Atherosclerosis of other arteries: Secondary | ICD-10-CM | POA: Diagnosis not present

## 2016-10-30 HISTORY — DX: Malignant neoplasm of connective and soft tissue, unspecified: C49.9

## 2016-10-30 MED ORDER — GLYCOPYRROLATE-FORMOTEROL 9-4.8 MCG/ACT IN AERO: 2.0000 | INHALATION_SPRAY | Freq: Two times a day (BID) | RESPIRATORY_TRACT | 5 refills | Status: DC

## 2016-10-30 MED ORDER — IOPAMIDOL (ISOVUE-370) INJECTION 76%
100.0000 mL | Freq: Once | INTRAVENOUS | Status: AC | PRN
  Administered 2016-10-30: 100 mL via INTRAVENOUS

## 2016-11-19 ENCOUNTER — Encounter: Payer: Medicare Other | Attending: Internal Medicine | Admitting: Respiratory Therapy

## 2016-11-19 DIAGNOSIS — J449 Chronic obstructive pulmonary disease, unspecified: Secondary | ICD-10-CM

## 2016-11-19 NOTE — Progress Notes (Signed)
Incomplete Session Note  Patient Details  Name: Stephen [name] MRN: [id number] Date of Birth: 19-May-1941 Referring Provider:    [name] did not complete his rehab session.  Stephen [name] arrived for the evaluation, but he decided that he was not ready to participate in [place] at this time. He has an appointment with Dr Mortimer Fries in 2 weeks and will review the program participation with the physician.

## 2016-11-19 NOTE — Patient Instructions (Signed)
Patient Instructions  Patient Details  Name: Stephen [name] MRN: [id number] Date of Birth: 1941/12/22 Referring Provider:  Vilinda Boehringer, MD  Below are the personal goals you chose as well as exercise and nutrition goals. Our goal is to help you keep on track towards obtaining and maintaining your goals. We will be discussing your progress on these goals with you throughout the program.  Initial Exercise Prescription:   Exercise Goals: Frequency: Be able to perform aerobic exercise three times per week working toward 3-5 days per week.  Intensity: Work with a perceived exertion of 11 (fairly light) - 15 (hard) as tolerated. Follow your new exercise prescription and watch for changes in prescription as you progress with the program. Changes will be reviewed with you when they are made.  Duration: You should be able to do 30 minutes of continuous aerobic exercise in addition to a 5 minute warm-up and a 5 minute cool-down routine.  Nutrition Goals: Your personal nutrition goals will be established when you do your nutrition analysis with the dietician.  The following are nutrition guidelines to follow: Cholesterol < '200mg'$ /day Sodium < '1500mg'$ /day Fiber: Men over 50 yrs - 30 grams per day  Personal Goals:     Personal Goals and Risk Factors at Admission - 11/19/16 1254      Core Components/Risk Factors/Patient Goals on Admission   Sedentary Yes   Intervention Provide advice, education, support and counseling about physical activity/exercise needs.;Develop an individualized exercise prescription for aerobic and resistive training based on initial evaluation findings, risk stratification, comorbidities and participant's personal goals.  Return to fishing and hunting   Expected Outcomes Achievement of increased cardiorespiratory fitness and enhanced flexibility, muscular endurance and strength shown through measurements of functional capacity and personal statement of participant.   Increase Strength and Stamina Yes   Intervention Provide advice, education, support and counseling about physical activity/exercise needs.;Develop an individualized exercise prescription for aerobic and resistive training based on initial evaluation findings, risk stratification, comorbidities and participant's personal goals.   Expected Outcomes Achievement of increased cardiorespiratory fitness and enhanced flexibility, muscular endurance and strength shown through measurements of functional capacity and personal statement of participant.   Tobacco Cessation Yes   Number of packs per day 6cig/day; Hx 2ppd for 60 years   Intervention Assist the participant in steps to quit. Provide individualized education and counseling about committing to Tobacco Cessation, relapse prevention, and pharmacological support that can be provided by physician.;Advice worker, assist with locating and accessing local/national Quit Smoking programs, and support quit date choice.   Expected Outcomes Short Term: Will demonstrate readiness to quit, by selecting a quit date.   Improve shortness of breath with ADL's Yes   Intervention Provide education, individualized exercise plan and daily activity instruction to help decrease symptoms of SOB with activities of daily living.   Expected Outcomes Short Term: Achieves a reduction of symptoms when performing activities of daily living.   Develop more efficient breathing techniques such as purse lipped breathing and diaphragmatic breathing; and practicing self-pacing with activity Yes   Intervention Provide education, demonstration and support about specific breathing techniuqes utilized for more efficient breathing. Include techniques such as pursed lipped breathing, diaphragmatic breathing and self-pacing activity.   Expected Outcomes Short Term: Participant will be able to demonstrate and use breathing techniques as needed throughout daily activities.   Increase  knowledge of respiratory medications and ability to use respiratory devices properly  Yes  Advair, Albuterol SVN, Proventil MDI. Spacer given   Intervention  Provide education and demonstration as needed of appropriate use of medications, inhalers, and oxygen therapy.   Expected Outcomes Short Term: Achieves understanding of medications use. Understands that oxygen is a medication prescribed by physician. Demonstrates appropriate use of inhaler and oxygen therapy.   Diabetes Yes   Intervention Provide education about signs/symptoms and action to take for hypo/hyperglycemia.;Provide education about proper nutrition, including hydration, and aerobic/resistive exercise prescription along with prescribed medications to achieve blood glucose in normal ranges: Fasting glucose 65-99 mg/dL   Expected Outcomes Short Term: Participant verbalizes understanding of the signs/symptoms and immediate care of hyper/hypoglycemia, proper foot care and importance of medication, aerobic/resistive exercise and nutrition plan for blood glucose control.;Long Term: Attainment of HbA1C < 7%.      Tobacco Use Initial Evaluation: History  Smoking Status   Former Smoker   Quit date: 07/31/2003  Smokeless Tobacco   Never Used    Copy of goals given to participant.

## 2016-11-19 NOTE — Progress Notes (Deleted)
Pulmonary Individual Treatment Plan  Patient Details  Name: Stephen [name] MRN: [id number] Date of Birth: [birth year] Referring Provider:    Initial Encounter Date:   Visit Diagnosis: Centrilobular emphysema ([place])  Patient's Home Medications on Admission:  Current Outpatient Prescriptions:    albuterol (PROVENTIL HFA;VENTOLIN HFA) 108 (90 Base) MCG/ACT inhaler, Inhale 2 puffs into the lungs every 6 (six) hours as needed for wheezing or shortness of breath., Disp: 1 Inhaler, Rfl: 0   AMBULATORY NON FORMULARY MEDICATION, Medication Name: incentive spirometer, Disp: 1 each, Rfl: 0   aspirin 325 MG tablet, Take 325 mg by mouth at bedtime. , Disp: , Rfl:    Cholecalciferol (VITAMIN D-3 PO), Take 1 tablet by mouth daily. , Disp: , Rfl:    Doxylamine Succinate, Sleep, (SLEEP AID PO), Take 1 tablet by mouth at bedtime., Disp: , Rfl:    Glycopyrrolate-Formoterol (BEVESPI AEROSPHERE) 9-4.8 MCG/ACT AERO, Inhale 2 puffs into the lungs 2 (two) times daily., Disp: 10.7 g, Rfl: 5   ipratropium-albuterol (DUONEB) 0.5-2.5 (3) MG/3ML SOLN, Take 3 mLs by nebulization every 6 (six) hours as needed., Disp: 360 mL, Rfl: 3   Omega-3 Fatty Acids (FISH OIL) 1000 MG CAPS, Take 1 capsule by mouth daily., Disp: , Rfl:    TURMERIC PO, Take by mouth., Disp: , Rfl:   Past Medical History: Past Medical History:  Diagnosis Date   Cancer ([place])    Lung CA- Bilateral   Coronary artery disease    Lung cancer ([place]) 07/16/2015   Myocardial infarction    coronary stent   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    Shortness of breath dyspnea    Stroke [facility])     Tobacco Use: History  Smoking Status   Former Smoker   Quit date: 07/31/2003  Smokeless Tobacco   Never Used    Labs: Recent Review Scientist, physiological    Labs for ITP Cardiac and Pulmonary Rehab Latest Ref Rng & Units 03/11/2013 09/06/2016   Cholestrol 0 - 200 mg/dL 228(H) -   LDLCALC 0 - 100 mg/dL 155(H) -   HDL 40 - 60 mg/dL 42 -    Trlycerides 0 - 200 mg/dL 155 -   PHART 7.350 - 7.450 - 7.38   PCO2ART 32.0 - 48.0 mmHg - 29(L)   HCO3 20.0 - 28.0 mmol/L - 17.2(L)   ACIDBASEDEF 0.0 - 2.0 mmol/L - 6.4(H)   O2SAT % - 90.0       ADL [facility]:     Pulmonary Assessment Scores    Row Name 11/19/16 1248         ADL UCSD   ADL Phase Entry     SOB Score total 93     Rest 3     Walk 5     Stairs 5     Bath 3     Dress 3     Shop 5        Pulmonary Function Assessment:     Pulmonary Function Assessment - 11/19/16 1246      Initial Spirometry Results   FVC% 34 %   FEV1% 26 %   FEV1/FVC Ratio 55.82   Comments Test DAte: 11/19/2016     Post Bronchodilator Spirometry Results   FVC% 42 %   FEV1% 36 %   FEV1/FVC Ratio 56.95     Breath   Bilateral Breath Sounds Wheezes;Expiratory;Decreased   Shortness of Breath Yes;Limiting activity;Fear of Shortness of Breath      Exercise Target Goals:  Exercise Program Goal: Individual exercise prescription set with THRR, safety & activity barriers. Participant demonstrates ability to understand and report RPE using BORG scale, to self-measure pulse accurately, and to acknowledge the importance of the exercise prescription.  Exercise Prescription Goal: Starting with aerobic activity 30 plus minutes a day, 3 days per week for initial exercise prescription. Provide home exercise prescription and guidelines that participant acknowledges understanding prior to discharge.  Activity Barriers & Risk Stratification:     Activity Barriers & Cardiac Risk Stratification - 11/19/16 1245      Activity Barriers & Cardiac Risk Stratification   Activity Barriers Back Problems;Shortness of Breath;Deconditioning;Muscular Weakness   Cardiac Risk Stratification Moderate      6 Minute Walk:   Initial Exercise Prescription:   Perform Capillary Blood Glucose checks as needed.  Exercise Prescription Changes:   Exercise Comments:   Discharge Exercise Prescription (Final  Exercise Prescription Changes):    Nutrition:  Target Goals: Understanding of nutrition guidelines, daily intake of sodium '1500mg'$ , cholesterol '200mg'$ , calories 30% from fat and 7% or less from saturated fats, daily to have 5 or more servings of fruits and vegetables.  Biometrics:    Nutrition Therapy Plan and Nutrition Goals:   Nutrition Discharge: Rate Your Plate Scores:   Psychosocial: Target Goals: Acknowledge presence or absence of depression, maximize coping skills, provide positive support system. Participant is able to verbalize types and ability to use techniques and skills needed for reducing stress and depression.  Initial Review & Psychosocial Screening:     Initial Psych Review & Screening - 11/19/16 1258      Family Dynamics   Good Support System? Yes   Comments Mr Hyser has great support from his wife and 3 children. The children live locally and help their father with chores around the home. Mr Gildner is looking forward to more activity and possibly return to fishing anad hunting.     Barriers   Psychosocial barriers to participate in program The patient should benefit from training in stress management and relaxation.     Screening Interventions   Interventions Encouraged to exercise;Program counselor consult      Quality of Life Scores:     Quality of Life - 11/19/16 1301      Quality of Life Scores   Health/Function Pre 19.63 %   Socioeconomic Pre 19.13 %   Psych/Spiritual Pre 20.67 %   Family Pre 21 %   GLOBAL Pre 19.89 %      PHQ-9: Recent Review Flowsheet Data    Depression screen [facility] 2/9 11/19/2016   Decreased Interest 1   Down, Depressed, Hopeless 1   PHQ - 2 Score 2   Altered sleeping 3   Tired, decreased energy 3   Change in appetite 1   Feeling bad or failure about yourself  0   Trouble concentrating 1   Moving slowly or fidgety/restless 0   Suicidal thoughts 0   PHQ-9 Score 10   Difficult doing work/chores Very difficult       Psychosocial Evaluation and Intervention:   Psychosocial Re-Evaluation:  Education: Education Goals: Education classes will be provided on a weekly basis, covering required topics. Participant will state understanding/return demonstration of topics presented.  Learning Barriers/Preferences:     Learning Barriers/Preferences - 11/19/16 1246      Learning Barriers/Preferences   Learning Barriers None   Learning Preferences None      Education Topics: Initial Evaluation Education: - Verbal, written and demonstration of respiratory meds, RPE/PD scales,  oximetry and breathing techniques. Instruction on use of nebulizers and MDIs: cleaning and proper use, rinsing mouth with steroid doses and importance of monitoring MDI activations. Flowsheet Row Pulmonary Rehab from 11/19/2016 in [facility] Cardiac and Pulmonary Rehab  Date  11/19/16  Educator  LB  Instruction Review Code  2- meets goals/outcomes      General Nutrition Guidelines/Fats and Fiber: -Group instruction provided by verbal, written material, models and posters to present the general guidelines for heart healthy nutrition. Gives an explanation and review of dietary fats and fiber.   Controlling Sodium/Reading Food Labels: -Group verbal and written material supporting the discussion of sodium use in heart healthy nutrition. Review and explanation with models, verbal and written materials for utilization of the food label.   Exercise Physiology & Risk Factors: - Group verbal and written instruction with models to review the exercise physiology of the cardiovascular system and associated critical values. Details cardiovascular disease risk factors and the goals associated with each risk factor.   Aerobic Exercise & Resistance Training: - Gives group verbal and written discussion on the health impact of inactivity. On the components of aerobic and resistive training programs and the benefits of this training and how to  safely progress through these programs.   Flexibility, Balance, General Exercise Guidelines: - Provides group verbal and written instruction on the benefits of flexibility and balance training programs. Provides general exercise guidelines with specific guidelines to those with heart or lung disease. Demonstration and skill practice provided.   Stress Management: - Provides group verbal and written instruction about the health risks of elevated stress, cause of high stress, and healthy ways to reduce stress.   Depression: - Provides group verbal and written instruction on the correlation between heart/lung disease and depressed mood, treatment options, and the stigmas associated with seeking treatment.   Exercise & Equipment Safety: - Individual verbal instruction and demonstration of equipment use and safety with use of the equipment.   Infection Prevention: - Provides verbal and written material to individual with discussion of infection control including proper hand washing and proper equipment cleaning during exercise session. Flowsheet Row Pulmonary Rehab from 11/19/2016 in [facility] Cardiac and Pulmonary Rehab  Date  11/19/16  Educator  LB  Instruction Review Code  2- meets goals/outcomes      Falls Prevention: - Provides verbal and written material to individual with discussion of falls prevention and safety. Flowsheet Row Pulmonary Rehab from 11/19/2016 in [facility] Cardiac and Pulmonary Rehab  Date  11/19/16  Educator  LB      Diabetes: - Individual verbal and written instruction to review signs/symptoms of diabetes, desired ranges of glucose level fasting, after meals and with exercise. Advice that pre and post exercise glucose checks will be done for 3 sessions at entry of program.   Chronic Lung Diseases: - Group verbal and written instruction to review new updates, new respiratory medications, new advancements in procedures and treatments. Provide informative websites and  "800" numbers of self-education.   Lung Procedures: - Group verbal and written instruction to describe testing methods done to diagnose lung disease. Review the outcome of test results. Describe the treatment choices: Pulmonary Function Tests, ABGs and oximetry.   Energy Conservation: - Provide group verbal and written instruction for methods to conserve energy, plan and organize activities. Instruct on pacing techniques, use of adaptive equipment and posture/positioning to relieve shortness of breath.   Triggers: - Group verbal and written instruction to review types of environmental controls: home humidity, furnaces, filters,  dust mite/pet prevention, HEPA vacuums. To discuss weather changes, air quality and the benefits of nasal washing.   Exacerbations: - Group verbal and written instruction to provide: warning signs, infection symptoms, calling MD promptly, preventive modes, and value of vaccinations. Review: effective airway clearance, coughing and/or vibration techniques. Create an Sports administrator.   Oxygen: - Individual and group verbal and written instruction on oxygen therapy. Includes supplement oxygen, available portable oxygen systems, continuous and intermittent flow rates, oxygen safety, concentrators, and Medicare reimbursement for oxygen. Flowsheet Row Pulmonary Rehab from 11/19/2016 in [facility] Cardiac and Pulmonary Rehab  Date  11/19/16  Educator  LB  Instruction Review Code  2- meets goals/outcomes      Respiratory Medications: - Group verbal and written instruction to review medications for lung disease. Drug class, frequency, complications, importance of spacers, rinsing mouth after steroid MDI's, and proper cleaning methods for nebulizers. Flowsheet Row Pulmonary Rehab from 11/19/2016 in [facility] Cardiac and Pulmonary Rehab  Date  11/19/16  Educator  LB  Instruction Review Code  2- meets goals/outcomes      AED/CPR: - Group verbal and written instruction with the use  of models to demonstrate the basic use of the AED with the basic ABC's of resuscitation.   Breathing Retraining: - Provides individuals verbal and written instruction on purpose, frequency, and proper technique of diaphragmatic breathing and pursed-lipped breathing. Applies individual practice skills. Flowsheet Row Pulmonary Rehab from 11/19/2016 in [facility] Cardiac and Pulmonary Rehab  Date  11/19/16  Educator  LB  Instruction Review Code  2- meets goals/outcomes      Anatomy and Physiology of the Lungs: - Group verbal and written instruction with the use of models to provide basic lung anatomy and physiology related to function, structure and complications of lung disease.   Heart Failure: - Group verbal and written instruction on the basics of heart failure: signs/symptoms, treatments, explanation of ejection fraction, enlarged heart and cardiomyopathy.   Sleep Apnea: - Individual verbal and written instruction to review Obstructive Sleep Apnea. Review of risk factors, methods for diagnosing and types of masks and machines for OSA.   Anxiety: - Provides group, verbal and written instruction on the correlation between heart/lung disease and anxiety, treatment options, and management of anxiety.   Relaxation: - Provides group, verbal and written instruction about the benefits of relaxation for patients with heart/lung disease. Also provides patients with examples of relaxation techniques.   Knowledge Questionnaire Score:     Knowledge Questionnaire Score - 11/19/16 1246      Knowledge Questionnaire Score   Pre Score 5/10       Core Components/Risk Factors/Patient Goals at Admission:     Personal Goals and Risk Factors at Admission - 11/19/16 1254      Core Components/Risk Factors/Patient Goals on Admission   Sedentary Yes   Intervention Provide advice, education, support and counseling about physical activity/exercise needs.;Develop an individualized exercise  prescription for aerobic and resistive training based on initial evaluation findings, risk stratification, comorbidities and participant's personal goals.  Return to fishing and hunting   Expected Outcomes Achievement of increased cardiorespiratory fitness and enhanced flexibility, muscular endurance and strength shown through measurements of functional capacity and personal statement of participant.   Increase Strength and Stamina Yes   Intervention Provide advice, education, support and counseling about physical activity/exercise needs.;Develop an individualized exercise prescription for aerobic and resistive training based on initial evaluation findings, risk stratification, comorbidities and participant's personal goals.   Expected Outcomes Achievement of increased cardiorespiratory fitness  and enhanced flexibility, muscular endurance and strength shown through measurements of functional capacity and personal statement of participant.   Tobacco Cessation Yes   Number of packs per day 6cig/day; Hx 2ppd for 60 years   Intervention Assist the participant in steps to quit. Provide individualized education and counseling about committing to Tobacco Cessation, relapse prevention, and pharmacological support that can be provided by physician.;Advice worker, assist with locating and accessing local/national Quit Smoking programs, and support quit date choice.   Expected Outcomes Short Term: Will demonstrate readiness to quit, by selecting a quit date.   Improve shortness of breath with ADL's Yes   Intervention Provide education, individualized exercise plan and daily activity instruction to help decrease symptoms of SOB with activities of daily living.   Expected Outcomes Short Term: Achieves a reduction of symptoms when performing activities of daily living.   Develop more efficient breathing techniques such as purse lipped breathing and diaphragmatic breathing; and practicing self-pacing  with activity Yes   Intervention Provide education, demonstration and support about specific breathing techniuqes utilized for more efficient breathing. Include techniques such as pursed lipped breathing, diaphragmatic breathing and self-pacing activity.   Expected Outcomes Short Term: Participant will be able to demonstrate and use breathing techniques as needed throughout daily activities.   Increase knowledge of respiratory medications and ability to use respiratory devices properly  Yes  Advair, Albuterol SVN, Proventil MDI. Spacer given   Intervention Provide education and demonstration as needed of appropriate use of medications, inhalers, and oxygen therapy.   Expected Outcomes Short Term: Achieves understanding of medications use. Understands that oxygen is a medication prescribed by physician. Demonstrates appropriate use of inhaler and oxygen therapy.   Diabetes Yes   Intervention Provide education about signs/symptoms and action to take for hypo/hyperglycemia.;Provide education about proper nutrition, including hydration, and aerobic/resistive exercise prescription along with prescribed medications to achieve blood glucose in normal ranges: Fasting glucose 65-99 mg/dL   Expected Outcomes Short Term: Participant verbalizes understanding of the signs/symptoms and immediate care of hyper/hypoglycemia, proper foot care and importance of medication, aerobic/resistive exercise and nutrition plan for blood glucose control.;Long Term: Attainment of HbA1C < 7%.      Core Components/Risk Factors/Patient Goals Review:    Core Components/Risk Factors/Patient Goals at Discharge (Final Review):    ITP Comments:   Comments: Mr Ellery plan to start [facility] on 11/25/16 and attend 2-3 days/week.   This encounter was created in error - please disregard.  This encounter was created in error - please disregard.

## 2016-11-20 ENCOUNTER — Telehealth: Payer: Self-pay | Admitting: Internal Medicine

## 2016-11-20 NOTE — Telephone Encounter (Signed)
Spoke to Dr.Dew re: CTV ? Lymphedema. I spoke to pt's daughter- recommend making an appointment with Dr. Lucky Cowboy office as soon as possible for further treatment recommendations  # Daughter concerned about increasing cough over the last few months.- Recommend the CT chest [already ordered] to be done as soon as possible; follow-up with me few days later.   Heather/ Almyra Free- please arrange above. Thcx

## 2016-11-20 NOTE — Telephone Encounter (Signed)
msg sent to [facility]. To r/s ct scan on 12/29 to ASAP - this week if possible.  Also - move up md apt in jan a few days s/p scan.

## 2016-11-21 ENCOUNTER — Telehealth: Payer: Self-pay | Admitting: Internal Medicine

## 2016-11-21 MED ORDER — IPRATROPIUM-ALBUTEROL 0.5-2.5 (3) MG/3ML IN SOLN: 3.0000 mL | Freq: Four times a day (QID) | RESPIRATORY_TRACT | 0 refills | Status: DC | PRN

## 2016-11-21 NOTE — Telephone Encounter (Signed)
Spoke with pharmacy and they ask if we can refill pt's duoneb until the [place] gets it sent out. Gave VO order for duoneb. Nothing further needed.

## 2016-11-21 NOTE — Telephone Encounter (Signed)
Pharmacist calling states pt is out of Ipratropium-albuterol. States [facility] is working on [facility] this, but will be at least 10 days. He is asking if DR. Will refill enough to hold over until [place] authorizes.

## 2016-11-27 ENCOUNTER — Other Ambulatory Visit: Payer: Self-pay

## 2016-11-27 ENCOUNTER — Telehealth: Payer: Self-pay | Admitting: Internal Medicine

## 2016-11-27 ENCOUNTER — Ambulatory Visit
Admission: RE | Admit: 2016-11-27 | Discharge: 2016-11-27 | Disposition: A | Discharge status: Home / Self Care | Payer: Medicare Other | Source: Ambulatory Visit | Attending: Internal Medicine | Admitting: Internal Medicine

## 2016-11-27 ENCOUNTER — Other Ambulatory Visit: Payer: Self-pay | Admitting: Internal Medicine

## 2016-11-27 DIAGNOSIS — I700 Atherosclerosis of aorta: Secondary | ICD-10-CM | POA: Insufficient documentation

## 2016-11-27 DIAGNOSIS — R918 Other nonspecific abnormal finding of lung field: Secondary | ICD-10-CM | POA: Diagnosis not present

## 2016-11-27 DIAGNOSIS — C3412 Malignant neoplasm of upper lobe, left bronchus or lung: Secondary | ICD-10-CM | POA: Insufficient documentation

## 2016-11-27 MED ORDER — IOPAMIDOL (ISOVUE-300) INJECTION 61%
75.0000 mL | Freq: Once | INTRAVENOUS | Status: AC | PRN
  Administered 2016-11-27: 75 mL via INTRAVENOUS

## 2016-11-27 MED ORDER — PREDNISONE 20 MG PO TABS: ORAL_TABLET | ORAL | 1 refills | Status: DC

## 2016-11-27 NOTE — Telephone Encounter (Signed)
Please inform pt that CT chest shows possible radiation changes; recommend prednisone/sent to pharmacy;lung mass smaller- follow up as planned. Dr.B

## 2016-11-27 NOTE — Telephone Encounter (Signed)
Spoke with patient and patient wife regarding CT chest results. Explained that  shows possible radiation changes; Md recommend pt to start on prednisone. rx went to [facility]. Explained lung masses appear smaller. Asked pt to keep follow up as planned with Dr.Brahmanday.

## 2016-11-29 ENCOUNTER — Inpatient Hospital Stay: Payer: Medicare Other

## 2016-11-29 ENCOUNTER — Inpatient Hospital Stay: Payer: Medicare Other | Attending: Internal Medicine | Admitting: Internal Medicine

## 2016-11-29 VITALS — BP 153/75 | HR 71 | Temp 96.5°F | Wt 218.0 lb

## 2016-11-29 DIAGNOSIS — Z955 Presence of coronary angioplasty implant and graft: Secondary | ICD-10-CM | POA: Diagnosis not present

## 2016-11-29 DIAGNOSIS — M7989 Other specified soft tissue disorders: Secondary | ICD-10-CM | POA: Insufficient documentation

## 2016-11-29 DIAGNOSIS — L723 Sebaceous cyst: Secondary | ICD-10-CM | POA: Diagnosis not present

## 2016-11-29 DIAGNOSIS — I2510 Atherosclerotic heart disease of native coronary artery without angina pectoris: Secondary | ICD-10-CM | POA: Insufficient documentation

## 2016-11-29 DIAGNOSIS — Z8673 Personal history of transient ischemic attack (TIA), and cerebral infarction without residual deficits: Secondary | ICD-10-CM | POA: Insufficient documentation

## 2016-11-29 DIAGNOSIS — I252 Old myocardial infarction: Secondary | ICD-10-CM | POA: Insufficient documentation

## 2016-11-29 DIAGNOSIS — Z7982 Long term (current) use of aspirin: Secondary | ICD-10-CM | POA: Insufficient documentation

## 2016-11-29 DIAGNOSIS — Z923 Personal history of irradiation: Secondary | ICD-10-CM

## 2016-11-29 DIAGNOSIS — C3412 Malignant neoplasm of upper lobe, left bronchus or lung: Secondary | ICD-10-CM

## 2016-11-29 DIAGNOSIS — C7800 Secondary malignant neoplasm of unspecified lung: Secondary | ICD-10-CM | POA: Diagnosis not present

## 2016-11-29 DIAGNOSIS — Z79899 Other long term (current) drug therapy: Secondary | ICD-10-CM | POA: Insufficient documentation

## 2016-11-29 DIAGNOSIS — Z87891 Personal history of nicotine dependence: Secondary | ICD-10-CM | POA: Diagnosis not present

## 2016-11-29 LAB — CBC WITH DIFFERENTIAL/PLATELET
BASOS ABS: 0 10*3/uL (ref 0–0.1)
Basophils Relative: 0 %
Eosinophils Absolute: 0 10*3/uL (ref 0–0.7)
Eosinophils Relative: 0 %
HEMATOCRIT: 43.8 % (ref 40.0–52.0)
HEMOGLOBIN: 14.5 g/dL (ref 13.0–18.0)
LYMPHS PCT: 20 %
Lymphs Abs: 1 10*3/uL (ref 1.0–3.6)
MCH: 28.2 pg (ref 26.0–34.0)
MCHC: 33.2 g/dL (ref 32.0–36.0)
MCV: 84.8 fL (ref 80.0–100.0)
MONO ABS: 0.4 10*3/uL (ref 0.2–1.0)
Monocytes Relative: 8 %
NEUTROS ABS: 3.8 10*3/uL (ref 1.4–6.5)
Neutrophils Relative %: 72 %
Platelets: 307 10*3/uL (ref 150–440)
RBC: 5.16 MIL/uL (ref 4.40–5.90)
RDW: 15.3 % — AB (ref 11.5–14.5)
WBC: 5.2 10*3/uL (ref 3.8–10.6)

## 2016-11-29 LAB — COMPREHENSIVE METABOLIC PANEL
ALK PHOS: 54 U/L (ref 38–126)
ALT: 13 U/L — AB (ref 17–63)
AST: 26 U/L (ref 15–41)
Albumin: 3.7 g/dL (ref 3.5–5.0)
Anion gap: 10 (ref 5–15)
BILIRUBIN TOTAL: 0.4 mg/dL (ref 0.3–1.2)
BUN: 17 mg/dL (ref 6–20)
CO2: 26 mmol/L (ref 22–32)
CREATININE: 0.82 mg/dL (ref 0.61–1.24)
Calcium: 9.2 mg/dL (ref 8.9–10.3)
Chloride: 103 mmol/L (ref 101–111)
GFR calc Af Amer: >60 mL/min (ref >60)
Glucose, Bld: 162 mg/dL — ABNORMAL HIGH (ref 65–99)
Potassium: 4.2 mmol/L (ref 3.5–5.1)
Sodium: 139 mmol/L (ref 135–145)
TOTAL PROTEIN: 7.1 g/dL (ref 6.5–8.1)

## 2016-11-29 MED ORDER — PREDNISONE 20 MG PO TABS: ORAL_TABLET | ORAL | 0 refills | Status: DC

## 2016-11-29 NOTE — Assessment & Plan Note (Addendum)
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 CT- chest improved left hilar lesions.  # Cough/ pul infiltrates- on predisnone 20 TID x2 week; and then BID x2 weeks; then once a day.   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 Clinically no evidence of  recurrence noted.   # sebaceous cyst- refer to Dr.Byrnett.   # Left LE swelling- dopplers-NEG. ? Lymphedema. Defer to Dr.Dew; spoke to him last week.   #  Follow up in 6 weeks/ no labs.  I reviewed the images myself and with the patient and family in detail.

## 2016-11-29 NOTE — Progress Notes (Signed)
Patient here today for follow up.   

## 2016-11-29 NOTE — Progress Notes (Signed)
[place] OFFICE PROGRESS NOTE  Patient Care Team: [place] Pink, MD as PCP - General (Family Medicine) Cammie Sickle, MD as Consulting Physician (Oncology)  No matching staging information was found for the patient.   Oncology History   # 2007-  Carcinoma of lung adenocarcinoma. T2N0M0 tumor. Status-post right thoracotomy and upper lobectomy. Stage I-B.  #  Diagnosis of sarcoma.  Left thigh (at [facility], [facility]    neo adjuvant radiation  and surgery ,  In November of 2011.pathology report shows poorly differentiated sarcoma, high grade.  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 [Dr.Oaks]  # AUG 2017- Recurrent sarcoma- Start RT [sep 2017; finished 10./01/2016]Left Hilar Mass Bx [Dr.kasa]; DEC 2017- PR left hilar mass;   # dec 2017- radiation Pneumonitis- started pre   3. 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 # NOV 2017- Left LE swelling ? Lymphedema [dr.dew]     Lung cancer ([place])   07/16/2015 Initial Diagnosis    Lung cancer       Malignant neoplasm of upper lobe, left bronchus or lung ([place])      INTERVAL HISTORY:  BROOKLYN JEFF 75 y.o.  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  In the interim patient's left lower extremity swelling has improved. Patient states that he had a "infected cyst" On the back; which his granddaughter "squeezed the puss out".   However patient noted to have worsening cough for the last few weeks. Nonproductive. Associated with shortness of breath. No wheezing. No hemoptysis.   Otherwise denies any new lumps or bumps. Appetite is good. No weight loss. Denies any headaches.    REVIEW OF SYSTEMS:  A complete 10 point review of system is  done which is negative except mentioned above/history of present illness.   PAST MEDICAL HISTORY :  Past Medical History:  Diagnosis Date  . Cancer ([place])    Lung CA- Bilateral  . Coronary artery disease   . Lung cancer ([place]) 07/16/2015  . Myocardial infarction    coronary stent  . Sarcoma [facility]) 2013   Right groin resection.   . Shortness of breath dyspnea   . Stroke [facility])     PAST SURGICAL HISTORY :   Past Surgical History:  Procedure Laterality Date  . CAROTID ENDARTERECTOMY Right   . ENDOBRONCHIAL ULTRASOUND N/A 08/08/2016   Procedure: ENDOBRONCHIAL ULTRASOUND;  Surgeon: Vilinda Boehringer, MD;  Location: [facility] ORS;  Service: Cardiopulmonary;  Laterality: N/A;  . SKIN CANCER EXCISION Left   . THORACOTOMY Bilateral    Lobectomy left lower lobe/sarcoma resection.   FAMILY HISTORY :  No family history on file. Family history of cancer/lung  SOCIAL HISTORY:  Currently not smoking. Social History  Substance Use Topics  . Smoking status: Former Smoker    Quit date: 07/31/2003  . Smokeless tobacco: Never Used  . Alcohol use No    ALLERGIES:  has No Known Allergies.  MEDICATIONS:  Current Outpatient Prescriptions  Medication Sig Dispense Refill  . albuterol (PROVENTIL HFA;VENTOLIN HFA) 108 (90 Base) MCG/ACT inhaler Inhale 2 puffs into the lungs every 6 (six) hours as needed for wheezing or shortness of breath. 1 Inhaler 0  . AMBULATORY NON FORMULARY MEDICATION Medication Name: incentive  spirometer 1 each 0  . aspirin 325 MG tablet Take 325 mg by mouth at bedtime.     . Cholecalciferol (VITAMIN D-3 PO) Take 1 tablet by mouth daily.     . Doxylamine Succinate, Sleep, (SLEEP AID PO) Take 1 tablet by mouth at bedtime.    . Glycopyrrolate-Formoterol (BEVESPI AEROSPHERE) 9-4.8 MCG/ACT AERO Inhale 2 puffs into the lungs 2 (two) times daily. 10.7 g 5  . ipratropium-albuterol (DUONEB) 0.5-2.5 (3) MG/3ML SOLN Take 3 mLs by nebulization every 6 (six) hours as needed. 360 mL 0  . Omega-3 Fatty  Acids (FISH OIL) 1000 MG CAPS Take 1 capsule by mouth daily.    . predniSONE (DELTASONE) 20 MG tablet 3 pills a day with food x2 weeks; and then 2 pills a day x2 weeks; and then one pill a day-donot stop until directed. 90 tablet 0  . TURMERIC PO Take by mouth.     No current facility-administered medications for this visit.     PHYSICAL EXAMINATION: ECOG PERFORMANCE STATUS: 0 - Asymptomatic  BP (!) 153/75 (BP Location: Right Arm, Patient Position: Sitting)   Pulse 71   Temp (!) 96.5 F (35.8 C) (Tympanic)   Wt 218 lb (98.9 kg)   SpO2 96%   BMI 27.99 kg/m   Filed Weights   11/29/16 0856  Weight: 218 lb (98.9 kg)    GENERAL: Well-nourished well-developed; Alert, no distress and comfortable.   Accompanied by his wife. EYES: no pallor or icterus OROPHARYNX: no thrush or ulceration; good dentition  NECK: supple, no masses felt LYMPH:  no palpable lymphadenopathy in the cervical, axillary or inguinal regions LUNGS: clear to auscultation and  No wheeze or crackles HEART/CVS: regular rate & rhythm and no murmurs; LEFT lower extremity edema 1+.  ABDOMEN:abdomen soft, non-tender and normal bowel sounds Musculoskeletal:no cyanosis of digits and no clubbing  PSYCH: alert & oriented x 3 with fluent speech NEURO: no focal motor/sensory deficits SKIN:  no rashes or significant lesions; sebaceous cyst noted on the back of infection at this time no tenderness  LABORATORY DATA:  I have reviewed the data as listed    Component Value Date/Time   NA 139 11/29/2016 0830   NA 138 01/03/2015 0942   K 4.2 11/29/2016 0830   K 4.8 01/03/2015 0942   CL 103 11/29/2016 0830   CL 100 01/03/2015 0942   CO2 26 11/29/2016 0830   CO2 30 01/03/2015 0942   GLUCOSE 162 (H) 11/29/2016 0830   GLUCOSE 117 (H) 01/03/2015 0942   BUN 17 11/29/2016 0830   BUN 14 01/03/2015 0942   CREATININE 0.82 11/29/2016 0830   CREATININE 1.05 01/03/2015 0942   CALCIUM 9.2 11/29/2016 0830   CALCIUM 9.2 01/03/2015 0942    PROT 7.1 11/29/2016 0830   PROT 7.8 01/03/2015 0942   ALBUMIN 3.7 11/29/2016 0830   ALBUMIN 4.0 01/03/2015 0942   AST 26 11/29/2016 0830   AST 24 01/03/2015 0942   ALT 13 (L) 11/29/2016 0830   ALT 34 01/03/2015 0942   ALKPHOS 54 11/29/2016 0830   ALKPHOS 73 01/03/2015 0942   BILITOT 0.4 11/29/2016 0830   BILITOT 0.6 01/03/2015 0942   GFRNONAA >60 11/29/2016 0830   GFRNONAA >60 01/03/2015 0942   GFRNONAA >60 06/29/2014 0947   GFRAA >60 11/29/2016 0830   GFRAA >60 01/03/2015 0942   GFRAA >60 06/29/2014 0947    No results found for: SPEP, UPEP  Lab Results  Component Value Date   WBC 5.2 11/29/2016  NEUTROABS 3.8 11/29/2016   HGB 14.5 11/29/2016   HCT 43.8 11/29/2016   MCV 84.8 11/29/2016   PLT 307 11/29/2016      Chemistry      Component Value Date/Time   NA 139 11/29/2016 0830   NA 138 01/03/2015 0942   K 4.2 11/29/2016 0830   K 4.8 01/03/2015 0942   CL 103 11/29/2016 0830   CL 100 01/03/2015 0942   CO2 26 11/29/2016 0830   CO2 30 01/03/2015 0942   BUN 17 11/29/2016 0830   BUN 14 01/03/2015 0942   CREATININE 0.82 11/29/2016 0830   CREATININE 1.05 01/03/2015 0942      Component Value Date/Time   CALCIUM 9.2 11/29/2016 0830   CALCIUM 9.2 01/03/2015 0942   ALKPHOS 54 11/29/2016 0830   ALKPHOS 73 01/03/2015 0942   AST 26 11/29/2016 0830   AST 24 01/03/2015 0942   ALT 13 (L) 11/29/2016 0830   ALT 34 01/03/2015 0942   BILITOT 0.4 11/29/2016 0830   BILITOT 0.6 01/03/2015 0942       RADIOGRAPHIC STUDIES: I have personally reviewed the radiological images as listed and agreed with the findings in the report. No results found.   ASSESSMENT & PLAN:  Malignant neoplasm of upper lobe, left bronchus or lung ([place]) # Recurrent sarcoma of the left hilum- biopsy proven s/p RT [finished Oct 2nd 2017]. CT- chest improved left hilar lesions.  # Cough/ pul infiltrates- on predisnone 20 TID x2 week; and then BID x2 weeks; then once a day.   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 Clinically no evidence of  recurrence noted.   # sebaceous cyst- refer to Dr.Byrnett.   # Left LE swelling- dopplers-NEG. ? Lymphedema. Defer to Dr.Dew; spoke to him last week.   #  Follow up in 6 weeks/ no labs.  I reviewed the images myself and with the patient and family in detail.   Orders Placed This Encounter  Procedures  . Ambulatory referral to General Surgery    Referral Priority:   Routine    Referral Type:   Surgical    Referral Reason:   Specialty Services Required    Referred to Provider:   Robert Bellow, MD    Requested Specialty:   General Surgery    Number of Visits Requested:   1   All questions were answered. The patient knows to call the clinic with any problems, questions or concerns.      Cammie Sickle, MD 12/02/2016 7:57 AM

## 2016-12-02 ENCOUNTER — Ambulatory Visit: Payer: Medicare Other | Admitting: Internal Medicine

## 2016-12-04 ENCOUNTER — Ambulatory Visit (INDEPENDENT_AMBULATORY_CARE_PROVIDER_SITE_OTHER): Payer: Medicare Other | Admitting: General Surgery

## 2016-12-04 ENCOUNTER — Encounter: Payer: Self-pay | Admitting: General Surgery

## 2016-12-04 VITALS — BP 148/90 | HR 72 | Resp 12 | Ht 74.0 in | Wt 219.0 lb

## 2016-12-04 DIAGNOSIS — L02212 Cutaneous abscess of back [any part, except buttock]: Secondary | ICD-10-CM

## 2016-12-04 MED ORDER — DOXYCYCLINE HYCLATE 100 MG PO CAPS: 100.0000 mg | ORAL_CAPSULE | Freq: Two times a day (BID) | ORAL | 0 refills | Status: DC

## 2016-12-04 NOTE — Progress Notes (Signed)
Patient ID: Stephen [name], male   DOB: Feb 13, 1941, 75 y.o.   MRN: [id number]  Chief Complaint  Patient presents with  . Cyst    HPI Stephen [name] is a 75 y.o. male.  Here today for evaluation of a cyst on his upper back. He states it has been there for years but since radiation he states it has gotten worse since October. It started draining about one month ago. His last radiation treatment was September 23 2016 for lung cancer. I have reviewed the history of present illness with the patient.   HPI  Past Medical History:  Diagnosis Date  . Cancer [facility]) 2007   Lung CA- Bilateral  . Coronary artery disease   . Lung cancer ([place]) 07/16/2015  . Myocardial infarction 2000   coronary stent  . Sarcoma [facility]) 2013   Right groin resection.   . Shortness of breath dyspnea   . Stroke [facility])     Past Surgical History:  Procedure Laterality Date  . ABDOMINAL AORTIC ANEURYSM REPAIR  10/28/2012   Dr Lucky Cowboy  . CAROTID ENDARTERECTOMY Right   . COLONOSCOPY  2013  . ENDOBRONCHIAL ULTRASOUND N/A 08/08/2016   Procedure: ENDOBRONCHIAL ULTRASOUND;  Surgeon: Vilinda Boehringer, MD;  Location: [facility] ORS;  Service: Cardiopulmonary;  Laterality: N/A;  . EYE SURGERY Bilateral 2010  . SKIN CANCER EXCISION Left   . THORACOTOMY Bilateral     No family history on file.  Social History Social History  Substance Use Topics  . Smoking status: Former Smoker    Quit date: 07/31/2003  . Smokeless tobacco: Never Used  . Alcohol use No    No Known Allergies  Current Outpatient Prescriptions  Medication Sig Dispense Refill  . albuterol (PROVENTIL HFA;VENTOLIN HFA) 108 (90 Base) MCG/ACT inhaler Inhale 2 puffs into the lungs every 6 (six) hours as needed for wheezing or shortness of breath. 1 Inhaler 0  . AMBULATORY NON FORMULARY MEDICATION Medication Name: incentive spirometer 1 each 0  . aspirin 325 MG tablet Take 325 mg by mouth at bedtime.     . Cholecalciferol (VITAMIN D-3 PO) Take 1 tablet by mouth daily.     .  Doxylamine Succinate, Sleep, (SLEEP AID PO) Take 1 tablet by mouth at bedtime.    . Glycopyrrolate-Formoterol (BEVESPI AEROSPHERE) 9-4.8 MCG/ACT AERO Inhale 2 puffs into the lungs 2 (two) times daily. 10.7 g 5  . ipratropium-albuterol (DUONEB) 0.5-2.5 (3) MG/3ML SOLN Take 3 mLs by nebulization every 6 (six) hours as needed. 360 mL 0  . Omega-3 Fatty Acids (FISH OIL) 1000 MG CAPS Take 1 capsule by mouth daily.    . predniSONE (DELTASONE) 20 MG tablet     . TURMERIC PO Take by mouth.    . doxycycline (VIBRAMYCIN) 100 MG capsule Take 1 capsule (100 mg total) by mouth 2 (two) times daily. 14 capsule 0   No current facility-administered medications for this visit.     Review of Systems Review of Systems  Constitutional: Negative.   Respiratory: Negative.   Cardiovascular: Negative.     Blood pressure (!) 148/90, pulse 72, resp. rate 12, height '6\' 2"'$  (1.88 m), weight 219 lb (99.3 kg).  Physical Exam Physical Exam  Constitutional: He is oriented to person, place, and time. He appears well-developed and well-nourished.  Neurological: He is alert and oriented to person, place, and time.  Skin: Skin is warm and dry.     4 x 3 cm sebaceous cyst upper back  Psychiatric: His behavior is normal.  Data Reviewed  None   Assessment    Back abscess, likely from a prexistant cyst. Recommnded full drainage. Pt agreeable.     Plan   Area prepped with chloro prep. 0.65m 1% xylocaine instilled. 1cm incision made . 177mof yellow pus drained.  Area dressed with4/4s and tape.  RX doxycycline 100 mg BID # 14 Follow up 6 weeks     This information has been scribed by MaKarie FetchN, BSN,BC.   SANKAR,SEEPLAPUTHUR G 12/04/2016, 12:25 PM

## 2016-12-04 NOTE — Patient Instructions (Signed)
The patient is aware to call back for any questions or concerns.  

## 2016-12-20 ENCOUNTER — Ambulatory Visit: Payer: Medicare Other

## 2016-12-26 ENCOUNTER — Other Ambulatory Visit: Payer: Medicare Other

## 2016-12-26 ENCOUNTER — Ambulatory Visit: Payer: Medicare Other | Admitting: Internal Medicine

## 2017-01-06 ENCOUNTER — Ambulatory Visit: Payer: Medicare Other | Admitting: Internal Medicine

## 2017-01-06 ENCOUNTER — Other Ambulatory Visit: Payer: Medicare Other

## 2017-01-08 ENCOUNTER — Encounter: Payer: Self-pay | Admitting: *Deleted

## 2017-01-08 ENCOUNTER — Inpatient Hospital Stay: Payer: Medicare Other | Admitting: Internal Medicine

## 2017-01-10 ENCOUNTER — Ambulatory Visit: Payer: Medicare Other | Admitting: Internal Medicine

## 2017-01-16 ENCOUNTER — Ambulatory Visit: Payer: Medicare Other | Admitting: General Surgery

## 2017-01-23 ENCOUNTER — Ambulatory Visit: Payer: Medicare Other | Admitting: Internal Medicine

## 2017-01-24 ENCOUNTER — Inpatient Hospital Stay: Payer: Medicare Other | Attending: Internal Medicine | Admitting: Internal Medicine

## 2017-01-24 VITALS — BP 152/82 | HR 67 | Temp 95.5°F | Wt 221.4 lb

## 2017-01-24 DIAGNOSIS — Z7982 Long term (current) use of aspirin: Secondary | ICD-10-CM | POA: Diagnosis not present

## 2017-01-24 DIAGNOSIS — C3412 Malignant neoplasm of upper lobe, left bronchus or lung: Secondary | ICD-10-CM | POA: Diagnosis not present

## 2017-01-24 DIAGNOSIS — C7800 Secondary malignant neoplasm of unspecified lung: Secondary | ICD-10-CM | POA: Diagnosis not present

## 2017-01-24 DIAGNOSIS — Z87891 Personal history of nicotine dependence: Secondary | ICD-10-CM | POA: Diagnosis not present

## 2017-01-24 DIAGNOSIS — Z79899 Other long term (current) drug therapy: Secondary | ICD-10-CM | POA: Diagnosis not present

## 2017-01-24 DIAGNOSIS — J700 Acute pulmonary manifestations due to radiation: Secondary | ICD-10-CM | POA: Diagnosis not present

## 2017-01-24 DIAGNOSIS — N400 Enlarged prostate without lower urinary tract symptoms: Secondary | ICD-10-CM | POA: Insufficient documentation

## 2017-01-24 DIAGNOSIS — T380X5A Adverse effect of glucocorticoids and synthetic analogues, initial encounter: Secondary | ICD-10-CM | POA: Insufficient documentation

## 2017-01-24 DIAGNOSIS — I2510 Atherosclerotic heart disease of native coronary artery without angina pectoris: Secondary | ICD-10-CM | POA: Insufficient documentation

## 2017-01-24 DIAGNOSIS — Z955 Presence of coronary angioplasty implant and graft: Secondary | ICD-10-CM | POA: Diagnosis not present

## 2017-01-24 DIAGNOSIS — I252 Old myocardial infarction: Secondary | ICD-10-CM | POA: Diagnosis not present

## 2017-01-24 DIAGNOSIS — Z8673 Personal history of transient ischemic attack (TIA), and cerebral infarction without residual deficits: Secondary | ICD-10-CM | POA: Insufficient documentation

## 2017-01-24 DIAGNOSIS — Z923 Personal history of irradiation: Secondary | ICD-10-CM | POA: Diagnosis not present

## 2017-01-24 DIAGNOSIS — F29 Unspecified psychosis not due to a substance or known physiological condition: Secondary | ICD-10-CM | POA: Insufficient documentation

## 2017-01-24 NOTE — Progress Notes (Signed)
[place] OFFICE PROGRESS NOTE  Patient Care Team: [place] Pink, MD as PCP - General (Family Medicine) Cammie Sickle, MD as Consulting Physician (Oncology) Seeplaputhur Robinette Haines, MD (General Surgery)  Cancer Staging No matching staging information was found for the patient.   Oncology History   # 2007-  Carcinoma of lung adenocarcinoma. T2N0M0 tumor. Status-post right thoracotomy and upper lobectomy. Stage I-B.  #  Diagnosis of sarcoma.  Left thigh (at [facility]    neo adjuvant radiation  and surgery ,  In November of 2011.pathology report shows poorly differentiated sarcoma, high grade.  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 [Dr.Oaks]  # AUG 2017- Recurrent sarcoma- Start RT [sep 2017; finished 10./01/2016]Left Hilar Mass Bx [Dr.kasa]; DEC 2017- PR left hilar mass;   # dec 2017- radiation Pneumonitis- started pre   3. 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 # NOV 2017- Left LE swelling ? Lymphedema [dr.dew]     Malignant neoplasm of upper lobe, left bronchus or lung ([place])      INTERVAL HISTORY:  LEMAR BAKOS 76 y.o.  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  In the interim patient was diagnosed with possible radiation pneumonitis on a CT scan in December 2017. Patient had been on steroids currently on 20 mg a day.  As per the wife patient "mean"; increased anxiety. Positive for weight gain. Cough is improved. He is currently using a nebulizer/inhaler's. No wheezing. No hemoptysis.  Appetite is good. No weight loss. Denies any headaches.   In the interim he has also met with surgery- sebaceous cyst on the back currently conservative management is  recommended. The left leg swelling is improved. Patient also complains of increased frequency of urination at night. Complains of urgency and decreased stream. He states that he has been taking flax seed which has helped his symptoms.   REVIEW OF SYSTEMS:  A complete 10 point review of system is done which is negative except mentioned above/history of present illness.   PAST MEDICAL HISTORY :  Past Medical History:  Diagnosis Date  . Cancer [facility]) 2007   Lung CA- Bilateral  . Coronary artery disease   . Lung cancer ([place]) 07/16/2015  . Myocardial infarction 2000   coronary stent  . Sarcoma [facility]) 2013   Right groin resection.   . Shortness of breath dyspnea   . Stroke [facility])     PAST SURGICAL HISTORY :   Past Surgical History:  Procedure Laterality Date  . ABDOMINAL AORTIC ANEURYSM REPAIR  10/28/2012   Dr Lucky Cowboy  . CAROTID ENDARTERECTOMY Right   . COLONOSCOPY  2013  . ENDOBRONCHIAL ULTRASOUND N/A 08/08/2016   Procedure: ENDOBRONCHIAL ULTRASOUND;  Surgeon: Vilinda Boehringer, MD;  Location: [facility] ORS;  Service: Cardiopulmonary;  Laterality: N/A;  . EYE SURGERY Bilateral 2010  . SKIN CANCER EXCISION Left   . THORACOTOMY Bilateral    Lobectomy left lower lobe/sarcoma resection.   FAMILY HISTORY :  No family history on file. Family history of cancer/lung  SOCIAL HISTORY:  Currently not smoking. Social History  Substance Use Topics  . Smoking status: Former Smoker    Quit date: 07/31/2003  . Smokeless tobacco: Never Used  . Alcohol use  No    ALLERGIES:  has No Known Allergies.  MEDICATIONS:  Current Outpatient Prescriptions  Medication Sig Dispense Refill  . albuterol (PROVENTIL HFA;VENTOLIN HFA) 108 (90 Base) MCG/ACT inhaler Inhale 2 puffs into the lungs every 6 (six) hours as needed for wheezing or shortness of breath. 1 Inhaler 0  . AMBULATORY NON FORMULARY MEDICATION Medication Name: incentive spirometer 1 each 0  . aspirin 325 MG tablet Take 325 mg by mouth at bedtime.     .  Cholecalciferol (VITAMIN D-3 PO) Take 1 tablet by mouth daily.     . Doxylamine Succinate, Sleep, (SLEEP AID PO) Take 1 tablet by mouth at bedtime.    . Flaxseed, Linseed, (FLAX SEEDS PO) Take by mouth.    . Glycopyrrolate-Formoterol (BEVESPI AEROSPHERE) 9-4.8 MCG/ACT AERO Inhale 2 puffs into the lungs 2 (two) times daily. 10.7 g 5  . ipratropium-albuterol (DUONEB) 0.5-2.5 (3) MG/3ML SOLN Take 3 mLs by nebulization every 6 (six) hours as needed. 360 mL 0  . Omega-3 Fatty Acids (FISH OIL) 1000 MG CAPS Take 1 capsule by mouth daily.    . predniSONE (DELTASONE) 20 MG tablet Take 20 mg by mouth daily with breakfast.     . TURMERIC PO Take by mouth.     No current facility-administered medications for this visit.     PHYSICAL EXAMINATION: ECOG PERFORMANCE STATUS: 0 - Asymptomatic  BP (!) 152/82 (BP Location: Left Arm, Patient Position: Sitting)   Pulse 67   Temp (!) 95.5 F (35.3 C) (Tympanic)   Wt 221 lb 6 oz (100.4 kg)   BMI 28.42 kg/m   Filed Weights   01/24/17 1137  Weight: 221 lb 6 oz (100.4 kg)    GENERAL: Well-nourished well-developed; Alert, no distress and comfortable.   Accompanied by his wife. EYES: no pallor or icterus OROPHARYNX: no thrush or ulceration; good dentition  NECK: supple, no masses felt LYMPH:  no palpable lymphadenopathy in the cervical, axillary or inguinal regions LUNGS: decreased breath sounds to auscultation and  No wheeze or crackles HEART/CVS: regular rate & rhythm and no murmurs; Bil LE- none.  ABDOMEN:abdomen soft, non-tender and normal bowel sounds Musculoskeletal:no cyanosis of digits and no clubbing  PSYCH: alert & oriented x 3 with fluent speech NEURO: no focal motor/sensory deficits SKIN:  no rashes or significant lesions; sebaceous cyst noted on the back of infection- serous discharge expressed.   LABORATORY DATA:  I have reviewed the data as listed    Component Value Date/Time   NA 139 11/29/2016 0830   NA 138 01/03/2015 0942   K 4.2  11/29/2016 0830   K 4.8 01/03/2015 0942   CL 103 11/29/2016 0830   CL 100 01/03/2015 0942   CO2 26 11/29/2016 0830   CO2 30 01/03/2015 0942   GLUCOSE 162 (H) 11/29/2016 0830   GLUCOSE 117 (H) 01/03/2015 0942   BUN 17 11/29/2016 0830   BUN 14 01/03/2015 0942   CREATININE 0.82 11/29/2016 0830   CREATININE 1.05 01/03/2015 0942   CALCIUM 9.2 11/29/2016 0830   CALCIUM 9.2 01/03/2015 0942   PROT 7.1 11/29/2016 0830   PROT 7.8 01/03/2015 0942   ALBUMIN 3.7 11/29/2016 0830   ALBUMIN 4.0 01/03/2015 0942   AST 26 11/29/2016 0830   AST 24 01/03/2015 0942   ALT 13 (L) 11/29/2016 0830   ALT 34 01/03/2015 0942   ALKPHOS 54 11/29/2016 0830   ALKPHOS 73 01/03/2015 0942   BILITOT 0.4 11/29/2016 0830   BILITOT 0.6 01/03/2015 5732  GFRNONAA >60 11/29/2016 0830   GFRNONAA >60 01/03/2015 0942   GFRNONAA >60 06/29/2014 0947   GFRAA >60 11/29/2016 0830   GFRAA >60 01/03/2015 0942   GFRAA >60 06/29/2014 0947    No results found for: SPEP, UPEP  Lab Results  Component Value Date   WBC 5.2 11/29/2016   NEUTROABS 3.8 11/29/2016   HGB 14.5 11/29/2016   HCT 43.8 11/29/2016   MCV 84.8 11/29/2016   PLT 307 11/29/2016      Chemistry      Component Value Date/Time   NA 139 11/29/2016 0830   NA 138 01/03/2015 0942   K 4.2 11/29/2016 0830   K 4.8 01/03/2015 0942   CL 103 11/29/2016 0830   CL 100 01/03/2015 0942   CO2 26 11/29/2016 0830   CO2 30 01/03/2015 0942   BUN 17 11/29/2016 0830   BUN 14 01/03/2015 0942   CREATININE 0.82 11/29/2016 0830   CREATININE 1.05 01/03/2015 0942      Component Value Date/Time   CALCIUM 9.2 11/29/2016 0830   CALCIUM 9.2 01/03/2015 0942   ALKPHOS 54 11/29/2016 0830   ALKPHOS 73 01/03/2015 0942   AST 26 11/29/2016 0830   AST 24 01/03/2015 0942   ALT 13 (L) 11/29/2016 0830   ALT 34 01/03/2015 0942   BILITOT 0.4 11/29/2016 0830   BILITOT 0.6 01/03/2015 0942       RADIOGRAPHIC STUDIES: I have personally reviewed the radiological images as listed  and agreed with the findings in the report. No results found.   ASSESSMENT & PLAN:  Malignant neoplasm of upper lobe, left bronchus or lung ([place]) # Recurrent sarcoma of the left hilum- biopsy proven s/p RT [finished Oct 2nd 2017]. CT- chest improved left hilar lesions.  # radiation Pneumonitis- s/p prednisone x 6 weeks; finish prednisone  In appx 1 week.    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 Clinically no evidence of  recurrence noted.   # Left LE swelling- ; improved.   # Prostatism symptoms.- check PSA at next visit.   # follow up in mid march 2018. Labs/CT few days prior.    Orders Placed This Encounter  Procedures  . CT CHEST W CONTRAST    Standing Status:   Future    Standing Expiration Date:   03/26/2018    Order Specific Question:   Reason for Exam (SYMPTOM  OR DIAGNOSIS REQUIRED)    Answer:   Lung cancer    Order Specific Question:   Preferred imaging location?    Answer:   [facility]  . Comprehensive metabolic panel    Standing Status:   Future    Standing Expiration Date:   01/24/2018  . CBC with Differential    Standing Status:   Future    Standing Expiration Date:   01/24/2018  . PSA    Standing Status:   Future    Standing Expiration Date:   01/24/2018   All questions were answered. The patient knows to call the clinic with any problems, questions or concerns.      Cammie Sickle, MD 01/24/2017 12:23 PM

## 2017-01-24 NOTE — Assessment & Plan Note (Addendum)
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 CT- chest improved left hilar lesions.  # radiation Pneumonitis- s/p prednisone x 6 weeks; finish prednisone  In appx 1 week.    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 Clinically no evidence of  recurrence noted.   # Left LE swelling- ; improved.   # Prostatism symptoms.- check PSA at next visit.   # follow up in mid march 2018. Labs/CT few days prior.

## 2017-01-24 NOTE — Progress Notes (Signed)
Patient here today for follow up.   

## 2017-01-27 ENCOUNTER — Ambulatory Visit: Payer: Medicare Other | Admitting: Internal Medicine

## 2017-01-27 ENCOUNTER — Encounter: Payer: Self-pay | Admitting: General Surgery

## 2017-01-27 ENCOUNTER — Ambulatory Visit (INDEPENDENT_AMBULATORY_CARE_PROVIDER_SITE_OTHER): Payer: Medicare Other | Admitting: General Surgery

## 2017-01-27 VITALS — BP 132/66 | HR 74 | Resp 16 | Ht 74.0 in | Wt 223.0 lb

## 2017-01-27 DIAGNOSIS — L02212 Cutaneous abscess of back [any part, except buttock]: Secondary | ICD-10-CM | POA: Diagnosis not present

## 2017-01-27 MED ORDER — DOXYCYCLINE HYCLATE 50 MG PO CAPS: 100.0000 mg | ORAL_CAPSULE | Freq: Two times a day (BID) | ORAL | 0 refills | Status: DC

## 2017-01-27 NOTE — Progress Notes (Signed)
Patient ID: Stephen [name], male   DOB: 1941-11-28, 76 y.o.   MRN: [id number]  Chief Complaint  Patient presents with  . Follow-up    back abscess    HPI Stephen [name] is a 76 y.o. male is here today to follow up for a back abscess. Patient had area drained performed on 12/04/16. Patient states the area is still draining some. Patient completed antibiotic course.  I have reviewed the history of present illness with the patient.   HPI  Past Medical History:  Diagnosis Date  . Cancer [facility]) 2007   Lung CA- Bilateral  . Coronary artery disease   . Lung cancer ([place]) 07/16/2015  . Myocardial infarction 2000   coronary stent  . Sarcoma [facility]) 2013   Right groin resection.   . Shortness of breath dyspnea   . Stroke [facility])     Past Surgical History:  Procedure Laterality Date  . ABDOMINAL AORTIC ANEURYSM REPAIR  10/28/2012   Dr Lucky Cowboy  . CAROTID ENDARTERECTOMY Right   . COLONOSCOPY  2013  . ENDOBRONCHIAL ULTRASOUND N/A 08/08/2016   Procedure: ENDOBRONCHIAL ULTRASOUND;  Surgeon: Vilinda Boehringer, MD;  Location: [facility] ORS;  Service: Cardiopulmonary;  Laterality: N/A;  . EYE SURGERY Bilateral 2010  . SKIN CANCER EXCISION Left   . THORACOTOMY Bilateral     No family history on file.  Social History Social History  Substance Use Topics  . Smoking status: Former Smoker    Quit date: 07/31/2003  . Smokeless tobacco: Never Used  . Alcohol use No    No Known Allergies  Current Outpatient Prescriptions  Medication Sig Dispense Refill  . albuterol (PROVENTIL HFA;VENTOLIN HFA) 108 (90 Base) MCG/ACT inhaler Inhale 2 puffs into the lungs every 6 (six) hours as needed for wheezing or shortness of breath. 1 Inhaler 0  . AMBULATORY NON FORMULARY MEDICATION Medication Name: incentive spirometer 1 each 0  . aspirin 325 MG tablet Take 325 mg by mouth at bedtime.     . Cholecalciferol (VITAMIN D-3 PO) Take 1 tablet by mouth daily.     . Doxylamine Succinate, Sleep, (SLEEP AID PO) Take 1 tablet by mouth  at bedtime.    . Flaxseed, Linseed, (FLAX SEEDS PO) Take by mouth.    . Glycopyrrolate-Formoterol (BEVESPI AEROSPHERE) 9-4.8 MCG/ACT AERO Inhale 2 puffs into the lungs 2 (two) times daily. 10.7 g 5  . ipratropium-albuterol (DUONEB) 0.5-2.5 (3) MG/3ML SOLN Take 3 mLs by nebulization every 6 (six) hours as needed. 360 mL 0  . Omega-3 Fatty Acids (FISH OIL) 1000 MG CAPS Take 1 capsule by mouth daily.    . TURMERIC PO Take by mouth.    . doxycycline (VIBRAMYCIN) 50 MG capsule Take 2 capsules (100 mg total) by mouth 2 (two) times daily. 14 capsule 0   No current facility-administered medications for this visit.     Review of Systems Review of Systems  Constitutional: Negative.   Respiratory: Negative.   Cardiovascular: Negative.     Blood pressure 132/66, pulse 74, resp. rate 16, height '6\' 2"'$  (1.88 m), weight 223 lb (101.2 kg).  Physical Exam Physical Exam  Constitutional: He is oriented to person, place, and time. He appears well-developed and well-nourished.  Neurological: He is alert and oriented to person, place, and time.  Skin: Skin is warm and dry.       Data Reviewed Prior note.  Assessment    Abscess of the back.    Plan    Area was prepped with alcohol,  and 1 mL of 1% xylocaine was injected around the central opening. A 5 mm cruciate incision was made. Pus and cyst contents were drained from abscess. Area was covered with large bandaid. Begin Rx Doxycycline 100 mg BID x 1 week Follow-up in 3-4 weeks.  This information has been scribed by Verlene Mayer, CMA         Lam Mccubbins G 01/27/2017, 10:08 AM

## 2017-01-27 NOTE — Patient Instructions (Addendum)
Follow-up in 3-4 weeks.

## 2017-02-05 ENCOUNTER — Other Ambulatory Visit: Payer: Self-pay

## 2017-02-05 MED ORDER — IPRATROPIUM-ALBUTEROL 0.5-2.5 (3) MG/3ML IN SOLN: 3.0000 mL | Freq: Four times a day (QID) | RESPIRATORY_TRACT | 0 refills | Status: DC | PRN

## 2017-02-10 ENCOUNTER — Telehealth: Payer: Self-pay | Admitting: Internal Medicine

## 2017-02-10 NOTE — Telephone Encounter (Signed)
°*  STAT* If patient is at the pharmacy, call can be transferred to refill team.   1. Which medications need to be refilled? (please list name of each medication and dose if known)  Ipratropium    2. Which pharmacy/location (including street and city if local pharmacy) is medication to be sent to? [facility]   3. Do they need a 30 day or 90 day supply? 30 days

## 2017-02-11 MED ORDER — IPRATROPIUM-ALBUTEROL 0.5-2.5 (3) MG/3ML IN SOLN: 3.0000 mL | Freq: Four times a day (QID) | RESPIRATORY_TRACT | 0 refills | Status: DC | PRN

## 2017-02-18 ENCOUNTER — Encounter: Payer: Self-pay | Admitting: General Surgery

## 2017-02-18 ENCOUNTER — Ambulatory Visit (INDEPENDENT_AMBULATORY_CARE_PROVIDER_SITE_OTHER): Payer: Medicare Other | Admitting: General Surgery

## 2017-02-18 VITALS — BP 128/70 | HR 84 | Resp 16 | Ht 74.0 in | Wt 228.0 lb

## 2017-02-18 DIAGNOSIS — L02212 Cutaneous abscess of back [any part, except buttock]: Secondary | ICD-10-CM

## 2017-02-18 NOTE — Patient Instructions (Signed)
The patient is aware to call back for any questions or concerns.  

## 2017-02-18 NOTE — Progress Notes (Signed)
Patient ID: Stephen [name], male   DOB: 07-23-1941, 76 y.o.   MRN: [id number]  Chief Complaint  Patient presents with  . Follow-up    HPI Stephen [name] is a 76 y.o. male.  Here today for follow up of back abscess. No further drainage. No complaints.  He is here today with his wife, Stephen [name]. I have reviewed the history of present illness with the patient.  HPI  Past Medical History:  Diagnosis Date  . Cancer [facility]) 2007   Lung CA- Bilateral  . Coronary artery disease   . Lung cancer ([place]) 07/16/2015  . Myocardial infarction 2000   coronary stent  . Sarcoma [facility]) 2013   Right groin resection.   . Shortness of breath dyspnea   . Stroke [facility])     Past Surgical History:  Procedure Laterality Date  . ABDOMINAL AORTIC ANEURYSM REPAIR  10/28/2012   Dr Lucky Cowboy  . CAROTID ENDARTERECTOMY Right   . COLONOSCOPY  2013  . ENDOBRONCHIAL ULTRASOUND N/A 08/08/2016   Procedure: ENDOBRONCHIAL ULTRASOUND;  Surgeon: Vilinda Boehringer, MD;  Location: [facility] ORS;  Service: Cardiopulmonary;  Laterality: N/A;  . EYE SURGERY Bilateral 2010  . SKIN CANCER EXCISION Left   . THORACOTOMY Bilateral     No family history on file.  Social History Social History  Substance Use Topics  . Smoking status: Former Smoker    Quit date: 07/31/2003  . Smokeless tobacco: Never Used  . Alcohol use No    No Known Allergies  Current Outpatient Prescriptions  Medication Sig Dispense Refill  . albuterol (PROVENTIL HFA;VENTOLIN HFA) 108 (90 Base) MCG/ACT inhaler Inhale 2 puffs into the lungs every 6 (six) hours as needed for wheezing or shortness of breath. 1 Inhaler 0  . AMBULATORY NON FORMULARY MEDICATION Medication Name: incentive spirometer 1 each 0  . aspirin 325 MG tablet Take 325 mg by mouth at bedtime.     . Cholecalciferol (VITAMIN D-3 PO) Take 1 tablet by mouth daily.     . Doxylamine Succinate, Sleep, (SLEEP AID PO) Take 1 tablet by mouth at bedtime.    . Flaxseed, Linseed, (FLAX SEEDS PO) Take by mouth.    .  Glycopyrrolate-Formoterol (BEVESPI AEROSPHERE) 9-4.8 MCG/ACT AERO Inhale 2 puffs into the lungs 2 (two) times daily. 10.7 [name] 5  . ipratropium-albuterol (DUONEB) 0.5-2.5 (3) MG/3ML SOLN Take 3 mLs by nebulization every 6 (six) hours as needed. 360 mL 0  . Omega-3 Fatty Acids (FISH OIL) 1000 MG CAPS Take 1 capsule by mouth daily.    . TURMERIC PO Take by mouth.     No current facility-administered medications for this visit.     Review of Systems Review of Systems  Constitutional: Negative.   Respiratory: Positive for cough (chronic).   Cardiovascular: Negative.     Blood pressure 128/70, pulse 84, resp. rate 16, height '6\' 2"'$  (1.88 m), weight 228 lb (103.4 kg).  Physical Exam Physical Exam  Constitutional: He is oriented to person, place, and time. He appears well-developed and well-nourished.  Neurological: He is alert and oriented to person, place, and time.  Skin: Skin is warm and dry.  Abscess is fully resolved.   Psychiatric: His behavior is normal.    Data Reviewed  Prior notes  Assessment    Back abscess fully resolved. No swelling or presence of residual cyst in area of previous abscess.    Plan    Follow up as needed. The patient is aware to call back for any questions or  concerns.      This information has been scribed by Karie Fetch RN, BSN,BC.   Stephen [name] 02/18/2017, 10:18 AM

## 2017-02-24 ENCOUNTER — Ambulatory Visit
Admission: RE | Admit: 2017-02-24 | Discharge: 2017-02-24 | Disposition: A | Discharge status: Home / Self Care | Payer: Medicare Other | Source: Ambulatory Visit | Attending: Radiation Oncology | Admitting: Radiation Oncology

## 2017-02-24 ENCOUNTER — Inpatient Hospital Stay: Payer: Medicare Other | Attending: Radiation Oncology

## 2017-02-24 ENCOUNTER — Ambulatory Visit: Payer: Medicare Other | Admitting: Internal Medicine

## 2017-02-24 ENCOUNTER — Encounter: Payer: Self-pay | Admitting: Radiation Oncology

## 2017-02-24 VITALS — BP 154/89 | HR 87 | Temp 96.3°F | Resp 22 | Wt 226.6 lb

## 2017-02-24 DIAGNOSIS — Z8673 Personal history of transient ischemic attack (TIA), and cerebral infarction without residual deficits: Secondary | ICD-10-CM | POA: Insufficient documentation

## 2017-02-24 DIAGNOSIS — N400 Enlarged prostate without lower urinary tract symptoms: Secondary | ICD-10-CM | POA: Insufficient documentation

## 2017-02-24 DIAGNOSIS — Z87891 Personal history of nicotine dependence: Secondary | ICD-10-CM | POA: Insufficient documentation

## 2017-02-24 DIAGNOSIS — I2510 Atherosclerotic heart disease of native coronary artery without angina pectoris: Secondary | ICD-10-CM | POA: Insufficient documentation

## 2017-02-24 DIAGNOSIS — Z955 Presence of coronary angioplasty implant and graft: Secondary | ICD-10-CM | POA: Diagnosis not present

## 2017-02-24 DIAGNOSIS — C7800 Secondary malignant neoplasm of unspecified lung: Secondary | ICD-10-CM | POA: Diagnosis not present

## 2017-02-24 DIAGNOSIS — C3412 Malignant neoplasm of upper lobe, left bronchus or lung: Secondary | ICD-10-CM | POA: Insufficient documentation

## 2017-02-24 DIAGNOSIS — Z923 Personal history of irradiation: Secondary | ICD-10-CM | POA: Insufficient documentation

## 2017-02-24 DIAGNOSIS — C7802 Secondary malignant neoplasm of left lung: Secondary | ICD-10-CM | POA: Insufficient documentation

## 2017-02-24 DIAGNOSIS — J700 Acute pulmonary manifestations due to radiation: Secondary | ICD-10-CM | POA: Diagnosis not present

## 2017-02-24 DIAGNOSIS — I252 Old myocardial infarction: Secondary | ICD-10-CM | POA: Diagnosis not present

## 2017-02-24 DIAGNOSIS — Z7982 Long term (current) use of aspirin: Secondary | ICD-10-CM | POA: Diagnosis not present

## 2017-02-24 DIAGNOSIS — Z79899 Other long term (current) drug therapy: Secondary | ICD-10-CM | POA: Diagnosis not present

## 2017-02-24 DIAGNOSIS — R05 Cough: Secondary | ICD-10-CM | POA: Diagnosis not present

## 2017-02-24 LAB — CBC WITH DIFFERENTIAL/PLATELET
Basophils Absolute: 0.1 10*3/uL (ref 0–0.1)
Basophils Relative: 1 %
EOS ABS: 0.1 10*3/uL (ref 0–0.7)
EOS PCT: 2 %
HCT: 44 % (ref 40.0–52.0)
Hemoglobin: 15 g/dL (ref 13.0–18.0)
LYMPHS ABS: 1 10*3/uL (ref 1.0–3.6)
LYMPHS PCT: 18 %
MCH: 29.1 pg (ref 26.0–34.0)
MCHC: 34.2 g/dL (ref 32.0–36.0)
MCV: 85.2 fL (ref 80.0–100.0)
MONO ABS: 0.5 10*3/uL (ref 0.2–1.0)
MONOS PCT: 10 %
Neutro Abs: 4 10*3/uL (ref 1.4–6.5)
Neutrophils Relative %: 69 %
PLATELETS: 237 10*3/uL (ref 150–440)
RBC: 5.17 MIL/uL (ref 4.40–5.90)
RDW: 16 % — AB (ref 11.5–14.5)
WBC: 5.7 10*3/uL (ref 3.8–10.6)

## 2017-02-24 LAB — COMPREHENSIVE METABOLIC PANEL
ALBUMIN: 3.6 g/dL (ref 3.5–5.0)
ALT: 20 U/L (ref 17–63)
AST: 26 U/L (ref 15–41)
Alkaline Phosphatase: 56 U/L (ref 38–126)
Anion gap: 8 (ref 5–15)
BUN: 18 mg/dL (ref 6–20)
CHLORIDE: 104 mmol/L (ref 101–111)
CO2: 26 mmol/L (ref 22–32)
CREATININE: 1.17 mg/dL (ref 0.61–1.24)
Calcium: 9.2 mg/dL (ref 8.9–10.3)
GFR calc Af Amer: >60 mL/min (ref >60)
GFR, EST NON AFRICAN AMERICAN: 59 mL/min — AB (ref >60)
GLUCOSE: 123 mg/dL — AB (ref 65–99)
POTASSIUM: 4.2 mmol/L (ref 3.5–5.1)
Sodium: 138 mmol/L (ref 135–145)
Total Bilirubin: 0.6 mg/dL (ref 0.3–1.2)
Total Protein: 6.6 g/dL (ref 6.5–8.1)

## 2017-02-24 LAB — PSA: PSA: 2.61 ng/mL (ref 0.00–4.00)

## 2017-02-24 NOTE — Progress Notes (Signed)
Radiation Oncology Follow up Note  Name: Stephen [name]   Date:   02/24/2017 MRN:  [id number] DOB: [birth year]    This 76 y.o. male presents to the clinic today for 5 month follow-up status post hypofractionated course of radiation therapy to his left hilum for metastatic pleomorphic sarcoma metastasis.Marland Kitchen  REFERRING PROVIDER: No ref. provider found  HPI: Patient is a 76 year old male now out 5 months having completed hypofractionated course of radiation therapy to his left hilum for metastatic pleomorphic sarcoma metastasis. Seen today in routine follow-up he is doing fairly well. Still has persistent nonproductive cough. Otherwise breathing is fine no dysphagia or hemoptysis.Marland Kitchen His last CT scan shows nice response and left hilum he is scheduled for another CT scan in the next 2 weeks.  COMPLICATIONS OF TREATMENT: none  FOLLOW UP COMPLIANCE: keeps appointments   PHYSICAL EXAM:  BP (!) 154/89   Pulse 87   Temp (!) 96.3 F (35.7 C)   Resp (!) 22   Wt 226 lb 10.1 oz (102.8 kg)   BMI 29.10 kg/m  Well-developed well-nourished patient in NAD. HEENT reveals PERLA, EOMI, discs not visualized.  Oral cavity is clear. No oral mucosal lesions are identified. Neck is clear without evidence of cervical or supraclavicular adenopathy. Lungs are clear to A&P. Cardiac examination is essentially unremarkable with regular rate and rhythm without murmur rub or thrill. Abdomen is benign with no organomegaly or masses noted. Motor sensory and DTR levels are equal and symmetric in the upper and lower extremities. Cranial nerves II through XII are grossly intact. Proprioception is intact. No peripheral adenopathy or edema is identified. No motor or sensory levels are noted. Crude visual fields are within normal range.  RADIOLOGY RESULTS: Most recent CT scan reviewed will follow-up on CT scan performed the next 2 weeks  PLAN: Present time he is doing well seems to have a good response to palliative radiation therapy to  his left hilum. I've explained to him the persistent cough this this is continuing to clear the debris from the left hilum. I've asked to see him back in 6 months for follow-up. Will review his next CT scan of the next 2 weeks. Patient knows to call with any concerns.  I would like to take this opportunity to thank you for allowing me to participate in the care of your patient.Armstead Peaks., MD

## 2017-02-28 ENCOUNTER — Other Ambulatory Visit: Payer: Medicare Other

## 2017-03-05 ENCOUNTER — Ambulatory Visit
Admission: RE | Admit: 2017-03-05 | Discharge: 2017-03-05 | Disposition: A | Discharge status: Home / Self Care | Payer: Medicare Other | Source: Ambulatory Visit | Attending: Internal Medicine | Admitting: Internal Medicine

## 2017-03-05 DIAGNOSIS — C3412 Malignant neoplasm of upper lobe, left bronchus or lung: Secondary | ICD-10-CM | POA: Diagnosis not present

## 2017-03-05 DIAGNOSIS — Z9889 Other specified postprocedural states: Secondary | ICD-10-CM | POA: Insufficient documentation

## 2017-03-05 DIAGNOSIS — I2510 Atherosclerotic heart disease of native coronary artery without angina pectoris: Secondary | ICD-10-CM | POA: Diagnosis not present

## 2017-03-05 DIAGNOSIS — I700 Atherosclerosis of aorta: Secondary | ICD-10-CM | POA: Insufficient documentation

## 2017-03-05 MED ORDER — IOPAMIDOL (ISOVUE-300) INJECTION 61%
75.0000 mL | Freq: Once | INTRAVENOUS | Status: AC | PRN
  Administered 2017-03-05: 75 mL via INTRAVENOUS

## 2017-03-06 ENCOUNTER — Inpatient Hospital Stay (HOSPITAL_BASED_OUTPATIENT_CLINIC_OR_DEPARTMENT_OTHER): Payer: Medicare Other | Admitting: Internal Medicine

## 2017-03-06 VITALS — BP 126/71 | HR 105 | Temp 97.1°F | Wt 226.2 lb

## 2017-03-06 DIAGNOSIS — J700 Acute pulmonary manifestations due to radiation: Secondary | ICD-10-CM | POA: Diagnosis not present

## 2017-03-06 DIAGNOSIS — Z923 Personal history of irradiation: Secondary | ICD-10-CM | POA: Diagnosis not present

## 2017-03-06 DIAGNOSIS — N400 Enlarged prostate without lower urinary tract symptoms: Secondary | ICD-10-CM | POA: Diagnosis not present

## 2017-03-06 DIAGNOSIS — C3412 Malignant neoplasm of upper lobe, left bronchus or lung: Secondary | ICD-10-CM

## 2017-03-06 DIAGNOSIS — C7800 Secondary malignant neoplasm of unspecified lung: Secondary | ICD-10-CM | POA: Diagnosis not present

## 2017-03-06 DIAGNOSIS — Z79899 Other long term (current) drug therapy: Secondary | ICD-10-CM

## 2017-03-06 NOTE — Progress Notes (Signed)
FYI- overall doing well- except for coughing spells; has appt with you next week. Thx

## 2017-03-06 NOTE — Progress Notes (Signed)
Patient here today for follow up.  Patient c/o cough  

## 2017-03-06 NOTE — Assessment & Plan Note (Addendum)
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 MARCH 2018- CT- chest improved left hilar lesion.   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 Clinically no evidence of  recurrence noted.   # Prostatism symptoms.-improved on flomax; PSA -Normal.   # follow up in 3 months; will order CT scan at that visit. No labs.   # I reviewed the blood work- with the patient in detail; also reviewed the imaging independently [as summarized above]; and with the patient in detail.

## 2017-03-06 NOTE — Progress Notes (Signed)
[place] OFFICE PROGRESS NOTE  Patient Care Team: Maryland Pink, MD as PCP - General (Family Medicine) Cammie Sickle, MD as Consulting Physician (Oncology) Seeplaputhur Robinette Haines, MD (General Surgery)  Cancer Staging No matching staging information was found for the patient.   Oncology History   # 2007-  Carcinoma of lung adenocarcinoma. T2N0M0 tumor. Status-post right thoracotomy and upper lobectomy. Stage I-B.  #  Diagnosis of sarcoma.  Left thigh ([facility]    neo adjuvant radiation  and surgery ,  In November of 2011.pathology report shows poorly differentiated sarcoma, high grade.  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 [Dr.Oaks]  # AUG 2017- Recurrent sarcoma- Start RT [sep 2017; finished 10./01/2016]Left Hilar Mass Bx [Dr.kasa]; DEC 2017- PR left hilar mass;   # dec 2017- radiation Pneumonitis- started pre   3. 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 # NOV 2017- Left LE swelling ? Lymphedema [dr.dew]     Malignant neoplasm of upper lobe, left bronchus or lung ([place])      INTERVAL HISTORY:  Stephen [name] 76 y.o.  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   Patient currently off his prednisone use to treat radiation pneumonitis. He still have coughing spells; which is slightly better but not completely resolved. He takes one prednisone as needed.  He is currently using a nebulizer/inhaler's. No wheezing. No hemoptysis.  Appetite is good. No weight loss. Denies any headaches.   His symptoms of prostatism- have improved since been started on Flomax.   REVIEW OF SYSTEMS:  A complete 10 point review of system is done which is negative except mentioned  above/history of present illness.   PAST MEDICAL HISTORY :  Past Medical History:  Diagnosis Date  . Cancer [facility]) 2007   Lung CA- Bilateral  . Coronary artery disease   . Lung cancer ([place]) 07/16/2015  . Myocardial infarction 2000   coronary stent  . Sarcoma [facility]) 2013   Right groin resection.   . Shortness of breath dyspnea   . Stroke [facility])     PAST SURGICAL HISTORY :   Past Surgical History:  Procedure Laterality Date  . ABDOMINAL AORTIC ANEURYSM REPAIR  10/28/2012   Dr Lucky Cowboy  . CAROTID ENDARTERECTOMY Right   . COLONOSCOPY  2013  . ENDOBRONCHIAL ULTRASOUND N/A 08/08/2016   Procedure: ENDOBRONCHIAL ULTRASOUND;  Surgeon: Vilinda Boehringer, MD;  Location: [facility] ORS;  Service: Cardiopulmonary;  Laterality: N/A;  . EYE SURGERY Bilateral 2010  . SKIN CANCER EXCISION Left   . THORACOTOMY Bilateral    Lobectomy left lower lobe/sarcoma resection.   FAMILY HISTORY :  No family history on file. Family history of cancer/lung  SOCIAL HISTORY:  Currently not smoking. Social History  Substance Use Topics  . Smoking status: Former Smoker    Quit date: 07/31/2003  . Smokeless tobacco: Never Used  . Alcohol use No    ALLERGIES:  has No Known Allergies.  MEDICATIONS:  Current Outpatient Prescriptions  Medication Sig Dispense Refill  . albuterol (PROVENTIL HFA;VENTOLIN HFA) 108 (90 Base) MCG/ACT inhaler Inhale 2 puffs into the lungs every 6 (six) hours as needed for wheezing or shortness of breath. 1 Inhaler 0  . aspirin 325  MG tablet Take 325 mg by mouth at bedtime.     . Cholecalciferol (VITAMIN D-3 PO) Take 1 tablet by mouth daily.     . Doxylamine Succinate, Sleep, (SLEEP AID PO) Take 1 tablet by mouth at bedtime.    . Flaxseed, Linseed, (FLAX SEEDS PO) Take by mouth.    . Glycopyrrolate-Formoterol (BEVESPI AEROSPHERE) 9-4.8 MCG/ACT AERO Inhale 2 puffs into the lungs 2 (two) times daily. 10.7 g 5  . ipratropium-albuterol (DUONEB) 0.5-2.5 (3) MG/3ML SOLN Take 3 mLs by nebulization every 6  (six) hours as needed. 360 mL 0  . Omega-3 Fatty Acids (FISH OIL) 1000 MG CAPS Take 1 capsule by mouth daily.    . tamsulosin (FLOMAX) 0.4 MG CAPS capsule Take 0.4 mg by mouth daily.    . TURMERIC PO Take by mouth.    . AMBULATORY NON FORMULARY MEDICATION Medication Name: incentive spirometer 1 each 0   No current facility-administered medications for this visit.     PHYSICAL EXAMINATION: ECOG PERFORMANCE STATUS: 0 - Asymptomatic  BP 126/71 (BP Location: Left Arm, Patient Position: Sitting)   Pulse (!) 105   Temp 97.1 F (36.2 C) (Tympanic)   Wt 226 lb 4 oz (102.6 kg)   BMI 29.05 kg/m   Filed Weights   03/06/17 0846  Weight: 226 lb 4 oz (102.6 kg)    GENERAL: Well-nourished well-developed; Alert, no distress and comfortable.   Accompanied by his wife. EYES: no pallor or icterus OROPHARYNX: no thrush or ulceration; good dentition  NECK: supple, no masses felt LYMPH:  no palpable lymphadenopathy in the cervical, axillary or inguinal regions LUNGS: decreased breath sounds to auscultation and  No wheeze or crackles HEART/CVS: regular rate & rhythm and no murmurs; Bil LE- none.  ABDOMEN:abdomen soft, non-tender and normal bowel sounds Musculoskeletal:no cyanosis of digits and no clubbing  PSYCH: alert & oriented x 3 with fluent speech NEURO: no focal motor/sensory deficits SKIN:  No rash.  LABORATORY DATA:  I have reviewed the data as listed    Component Value Date/Time   NA 138 02/24/2017 1330   NA 138 01/03/2015 0942   K 4.2 02/24/2017 1330   K 4.8 01/03/2015 0942   CL 104 02/24/2017 1330   CL 100 01/03/2015 0942   CO2 26 02/24/2017 1330   CO2 30 01/03/2015 0942   GLUCOSE 123 (H) 02/24/2017 1330   GLUCOSE 117 (H) 01/03/2015 0942   BUN 18 02/24/2017 1330   BUN 14 01/03/2015 0942   CREATININE 1.17 02/24/2017 1330   CREATININE 1.05 01/03/2015 0942   CALCIUM 9.2 02/24/2017 1330   CALCIUM 9.2 01/03/2015 0942   PROT 6.6 02/24/2017 1330   PROT 7.8 01/03/2015 0942    ALBUMIN 3.6 02/24/2017 1330   ALBUMIN 4.0 01/03/2015 0942   AST 26 02/24/2017 1330   AST 24 01/03/2015 0942   ALT 20 02/24/2017 1330   ALT 34 01/03/2015 0942   ALKPHOS 56 02/24/2017 1330   ALKPHOS 73 01/03/2015 0942   BILITOT 0.6 02/24/2017 1330   BILITOT 0.6 01/03/2015 0942   GFRNONAA 59 (L) 02/24/2017 1330   GFRNONAA >60 01/03/2015 0942   GFRNONAA >60 06/29/2014 0947   GFRAA >60 02/24/2017 1330   GFRAA >60 01/03/2015 0942   GFRAA >60 06/29/2014 0947    No results found for: SPEP, UPEP  Lab Results  Component Value Date   WBC 5.7 02/24/2017   NEUTROABS 4.0 02/24/2017   HGB 15.0 02/24/2017   HCT 44.0 02/24/2017   MCV 85.2 02/24/2017  PLT 237 02/24/2017      Chemistry      Component Value Date/Time   NA 138 02/24/2017 1330   NA 138 01/03/2015 0942   K 4.2 02/24/2017 1330   K 4.8 01/03/2015 0942   CL 104 02/24/2017 1330   CL 100 01/03/2015 0942   CO2 26 02/24/2017 1330   CO2 30 01/03/2015 0942   BUN 18 02/24/2017 1330   BUN 14 01/03/2015 0942   CREATININE 1.17 02/24/2017 1330   CREATININE 1.05 01/03/2015 0942      Component Value Date/Time   CALCIUM 9.2 02/24/2017 1330   CALCIUM 9.2 01/03/2015 0942   ALKPHOS 56 02/24/2017 1330   ALKPHOS 73 01/03/2015 0942   AST 26 02/24/2017 1330   AST 24 01/03/2015 0942   ALT 20 02/24/2017 1330   ALT 34 01/03/2015 0942   BILITOT 0.6 02/24/2017 1330   BILITOT 0.6 01/03/2015 0942       RADIOGRAPHIC STUDIES: I have personally reviewed the radiological images as listed and agreed with the findings in the report. Ct Chest W Contrast  Result Date: 03/05/2017 CLINICAL DATA:  Recurrent sarcoma of the left hilum. EXAM: CT CHEST WITH CONTRAST TECHNIQUE: Multidetector CT imaging of the chest was performed during intravenous contrast administration. CONTRAST:  6m ISOVUE-300 IOPAMIDOL (ISOVUE-300) INJECTION 61% COMPARISON:  11/27/2016. FINDINGS: Cardiovascular: Atherosclerotic calcification of the arterial vasculature, including  three-vessel involvement of the coronary arteries. Heart is at the upper limits of normal in size. No pericardial effusion. Mediastinum/Nodes: No pathologically enlarged mediastinal or axillary lymph nodes. Heterogeneous left hilar mass measures 2.7 x 3.4 cm, previously 3.4 x 4.5 cm. Esophagus is grossly unremarkable. Lungs/Pleura: Right elbow and left lower lobectomies. Architectural distortion, bronchiectasis and confluent areas of soft tissue in the left perihilar region have a more organized appearance than on 11/27/2016. Left upper and left lower lobe bronchi appear occluded, new from 11/27/2016. No pleural fluid. 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 Airway is otherwise unremarkable. Upper Abdomen: Visualized portions of the liver, adrenal glands, kidneys, spleen, pancreas, stomach and bowel are grossly unremarkable. No upper abdominal adenopathy. Musculoskeletal: No worrisome lytic or sclerotic lesions. Degenerative changes are seen in the spine. Partial fusion of the right fifth and sixth ribs, laterally. IMPRESSION: 1. Left hilar mass has decreased in size slightly from 11/27/2016. New occlusion of the left upper and left lower lobe bronchi. 2. Presumed evolutionary changes of radiation therapy in the perihilar left upper lobe. 3. Aortic atherosclerosis (ICD10-170.0). Coronary artery calcification. Electronically Signed   By: MLorin PicketM.D.   On: 03/05/2017 09:10     ASSESSMENT & PLAN:  Malignant neoplasm of upper lobe, left bronchus or lung [place] # Recurrent sarcoma of the left hilum- biopsy proven s/p RT [finished Oct 2nd 2017]. MARCH 2018- CT- chest improved left hilar lesion.   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  # Left upper  lobe stage I adenocarcinoma lung- status post resection. Clinically no evidence of  recurrence noted.   # Prostatism symptoms.-improved on flomax; PSA -Normal.   # follow up in 3 months; will order CT scan at that visit. No labs.   # I reviewed the blood work- with the patient in detail; also reviewed the imaging independently [as summarized above]; and with the patient  in detail.    No orders of the defined types were placed in this encounter.  All questions were answered. The patient knows to call the clinic with any problems, questions or concerns.      Cammie Sickle, MD 03/06/2017 12:07 PM

## 2017-03-07 ENCOUNTER — Inpatient Hospital Stay: Payer: Medicare Other | Admitting: Internal Medicine

## 2017-03-11 ENCOUNTER — Encounter: Payer: Self-pay | Admitting: Internal Medicine

## 2017-03-11 ENCOUNTER — Ambulatory Visit (INDEPENDENT_AMBULATORY_CARE_PROVIDER_SITE_OTHER): Payer: Medicare Other | Admitting: Internal Medicine

## 2017-03-11 VITALS — BP 122/64 | HR 97 | Ht 74.0 in | Wt 226.0 lb

## 2017-03-11 DIAGNOSIS — J449 Chronic obstructive pulmonary disease, unspecified: Secondary | ICD-10-CM

## 2017-03-11 MED ORDER — GLYCOPYRROLATE-FORMOTEROL 9-4.8 MCG/ACT IN AERO: 2.0000 | INHALATION_SPRAY | Freq: Two times a day (BID) | RESPIRATORY_TRACT | 0 refills | Status: AC

## 2017-03-11 MED ORDER — FLUTICASONE FUROATE-VILANTEROL 100-25 MCG/INH IN AEPB: 1.0000 | INHALATION_SPRAY | Freq: Every day | RESPIRATORY_TRACT | 0 refills | Status: AC

## 2017-03-11 MED ORDER — GUAIFENESIN-CODEINE 100-10 MG/5ML PO SOLN: 5.0000 mL | ORAL | 0 refills | Status: DC | PRN

## 2017-03-11 NOTE — Patient Instructions (Signed)
Start BREO 100 Cough Medicine as needed

## 2017-03-11 NOTE — Progress Notes (Signed)
[facility] Consultation      MRN# [id number] Stephen [name] [birth year]   CC: Chief Complaint  Patient presents with  . Follow-up    VM pt. 6wk rov. pt states breathing has worsen since last ov. 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 Treating for COPD  CC follow up COPD HPI Patient presents today for follow-up visit of his COPD, left hilar mass. Denies any sick contacts, chills, fevers. Patient did get his flu shot this year. Patient states that he's been getting radiation therapy and has follow up with rad onc in 6 months Results of his hilar mass shows findings consistent with metastatic sarcoma. Today he does still endorse some mild shortness of breath, cough, intermittent wheezing. He denies any smoking.  Current inhalers are bevespi Has cough for last several weeks     Current Outpatient Prescriptions:  .  albuterol (PROVENTIL HFA;VENTOLIN HFA) 108 (90 Base) MCG/ACT inhaler, Inhale 2 puffs into the lungs every 6 (six) hours as needed for wheezing or shortness of breath., Disp: 1 Inhaler, Rfl: 0 .  AMBULATORY NON FORMULARY MEDICATION, Medication Name: incentive spirometer, Disp: 1 each, Rfl: 0 .  aspirin 325 MG tablet, Take 325 mg by mouth at bedtime. , Disp: , Rfl:  .  Cholecalciferol (VITAMIN D-3 PO), Take 1 tablet by mouth daily. , Disp: , Rfl:  .  Doxylamine Succinate, Sleep, (SLEEP AID PO), Take 1 tablet by mouth at bedtime., Disp: , Rfl:  .  Flaxseed, Linseed, (FLAX SEEDS PO), Take by mouth., Disp: , Rfl:  .  Glycopyrrolate-Formoterol (BEVESPI AEROSPHERE) 9-4.8 MCG/ACT AERO, Inhale 2  puffs into the lungs 2 (two) times daily., Disp: 10.7 g, Rfl: 5 .  ipratropium-albuterol (DUONEB) 0.5-2.5 (3) MG/3ML SOLN, Take 3 mLs by nebulization every 6 (six) hours as needed., Disp: 360 mL, Rfl: 0 .  Omega-3 Fatty Acids (FISH OIL) 1000 MG CAPS, Take 1 capsule by mouth daily., Disp: , Rfl:  .  tamsulosin (FLOMAX) 0.4 MG CAPS capsule, Take 0.4 mg by mouth daily., Disp: , Rfl:  .  TURMERIC PO, Take by mouth., Disp: , Rfl:    Review of Systems  Constitutional: Negative for chills and fever.  Eyes: Negative for blurred vision and double vision.  Respiratory: Positive for cough, shortness of breath and wheezing. Negative for hemoptysis and sputum production.   Gastrointestinal: Negative for heartburn and nausea.  Genitourinary: Negative for dysuria.  Musculoskeletal: Negative for myalgias.  Skin: Negative for rash.  Neurological: Negative for dizziness and headaches.  Endo/Heme/Allergies: Does not bruise/bleed easily.  Psychiatric/Behavioral: Negative for depression.      Allergies:  Patient has no known allergies.  Physical Examination:  VS: BP 122/64 (BP Location: Left Arm, Cuff Size: Normal)   Pulse 97   Ht '6\' 2"'$  (1.88 m)   Wt 226 lb (102.5 kg)   SpO2 94%   BMI 29.02 kg/m   General Appearance: No distress  HEENT: PERRLA, no ptosis, no other lesions noticed Pulmonary:normal breath sounds., diaphragmatic excursion normal.No wheezing, No rales   Cardiovascular:  Normal S1,S2.  No m/r/g.     Abdomen:Exam: Benign, Soft, non-tender, No masses  Skin:  warm, no rashes, no ecchymosis  Extremities: normal, no cyanosis, clubbing, warm with normal capillary refill.      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 Will start ICS therapy  COPD -will need PFT's and 6MWT at next visit -will start BREO 100 -Nebulizer machine with DuoNeb treatment: 1 DuoNeb treatment 4 times a day as needed for shortness of breath/wheezing/recurrent cough -See plan also for COPD exacerbation -Patient educated on the use of rescue inhaler and maintenance inhaler. -Avoid any forms of secondhand smoking, electronic cigarettes, vapors, etc. 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  Following with hematology/oncology, and radiation oncology. -will have surveillance scanning done in the near future.  Plan: -Continue with plan as outlined by hematology oncology, and radiation oncology  Follow up in 3 months Patient/Family are satisfied with Plan of action and management. All questions answered  Corrin Parker, M.D.  [facility]  Medical Director [place] Director [facility] Cardio-Pulmonary Department

## 2017-03-11 NOTE — Addendum Note (Signed)
Addended by: Maryanna Shape A on: 03/11/2017 09:37 AM   Modules accepted: Orders

## 2017-03-18 ENCOUNTER — Ambulatory Visit: Payer: Self-pay | Admitting: Urology

## 2017-04-10 ENCOUNTER — Other Ambulatory Visit: Payer: Self-pay | Admitting: Radiology

## 2017-04-10 ENCOUNTER — Ambulatory Visit: Payer: Medicare Other | Admitting: Urology

## 2017-04-10 ENCOUNTER — Encounter: Payer: Self-pay | Admitting: Urology

## 2017-04-10 VITALS — BP 110/62 | HR 93 | Ht 74.0 in | Wt 228.0 lb

## 2017-04-10 DIAGNOSIS — R3129 Other microscopic hematuria: Secondary | ICD-10-CM

## 2017-04-10 DIAGNOSIS — N210 Calculus in bladder: Secondary | ICD-10-CM | POA: Diagnosis not present

## 2017-04-10 LAB — MICROSCOPIC EXAMINATION

## 2017-04-10 LAB — URINALYSIS, COMPLETE
BILIRUBIN UA: NEGATIVE
GLUCOSE, UA: NEGATIVE
KETONES UA: NEGATIVE
Nitrite, UA: NEGATIVE
SPEC GRAV UA: 1.025 (ref 1.005–1.030)
Urobilinogen, Ur: 0.2 mg/dL (ref 0.2–1.0)
pH, UA: 5.5 (ref 5.0–7.5)

## 2017-04-10 NOTE — Progress Notes (Signed)
04/10/2017 2:58 PM   Stephen [name] [birth year] [id number]  Referring provider: Maryland Pink, MD [address] [facility] [place], [place] [address]  Chief Complaint  Patient presents with  . 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 He also has a history of BPH and was started on flomax recently.  1. Microscopic hematuria PET scan in August 2017 showed normal kidneys and bladder and a large stellate bladder calculus.  2. BPH He was having significant urinary symptoms including nocturia, frequency, dysuria, and intermittency. A few weeks ago, he was started on Flomax and these have all resolved.    PMH: Past Medical History:  Diagnosis Date  . Cancer [facility]) 2007   Lung CA- Bilateral  . Coronary artery disease   . Lung cancer ([place]) 07/16/2015  . Myocardial infarction [facility]) 2000   coronary stent  . Sarcoma [facility]) 2013   Right groin resection.   . Shortness of breath dyspnea   . Stroke [facility])     Surgical History: Past Surgical History:  Procedure Laterality Date  . ABDOMINAL AORTIC ANEURYSM REPAIR  10/28/2012   Dr Lucky Cowboy  . CAROTID ENDARTERECTOMY Right   . COLONOSCOPY  2013  . ENDOBRONCHIAL ULTRASOUND N/A 08/08/2016   Procedure: ENDOBRONCHIAL ULTRASOUND;  Surgeon: Vilinda Boehringer, MD;  Location: [facility] ORS;  Service: Cardiopulmonary;  Laterality: N/A;  . EYE SURGERY Bilateral 2010  . SKIN CANCER EXCISION Left   . THORACOTOMY Bilateral     Home Medications:  Allergies as of 04/10/2017   No Known Allergies     Medication List       Accurate as of 04/10/17  2:58 PM. Always use your most recent med list.          albuterol 108 (90 Base) MCG/ACT  inhaler Commonly known as:  PROVENTIL HFA;VENTOLIN HFA Inhale 2 puffs into the lungs every 6 (six) hours as needed for wheezing or shortness of breath.   AMBULATORY NON FORMULARY MEDICATION Medication Name: incentive spirometer   aspirin 325 MG tablet Take 325 mg by mouth at bedtime.   Fish Oil 1000 MG Caps Take 1 capsule by mouth daily.   FLAX SEEDS PO Take by mouth.   Glycopyrrolate-Formoterol 9-4.8 MCG/ACT Aero Commonly known as:  BEVESPI AEROSPHERE Inhale 2 puffs into the lungs 2 (two) times daily.   guaiFENesin-codeine 100-10 MG/5ML syrup Take 5 mLs by mouth every 4 (four) hours as needed for cough.   ipratropium-albuterol 0.5-2.5 (3) MG/3ML Soln Commonly known as:  DUONEB Take 3 mLs by nebulization every 6 (six) hours as needed.   SLEEP AID PO Take 1 tablet by mouth at bedtime.   tamsulosin 0.4 MG Caps capsule Commonly known as:  FLOMAX Take 0.4 mg by mouth daily.   TURMERIC PO Take by mouth.   VITAMIN D-3 PO Take 1 tablet by mouth daily.       Allergies: No Known Allergies  Family History: Family History  Problem Relation Age of Onset  . Hematuria Neg Hx   . Prostate cancer Neg Hx     Social History:  reports that he quit smoking about 13 years ago. He has never used smokeless tobacco. He reports that he does not  drink alcohol or use drugs.  ROS: UROLOGY Frequent Urination?: Yes Hard to postpone urination?: No Burning/pain with urination?: Yes Get up at night to urinate?: Yes Leakage of urine?: No Urine stream starts and stops?: Yes Trouble starting stream?: No Do you have to strain to urinate?: No Blood in urine?: Yes Urinary tract infection?: No Sexually transmitted disease?: No Injury to kidneys or bladder?: No Painful intercourse?: No Weak stream?: No Erection problems?: No Penile pain?: No  Gastrointestinal Nausea?: No Vomiting?: No Indigestion/heartburn?: No Diarrhea?: No Constipation?: No  Constitutional Fever: No Night  sweats?: No Weight loss?: Yes Fatigue?: Yes  Skin Skin rash/lesions?: No Itching?: No  Eyes Blurred vision?: No Double vision?: No  Ears/Nose/Throat Sore throat?: No Sinus problems?: No  Hematologic/Lymphatic Swollen glands?: No Easy bruising?: No  Cardiovascular Leg swelling?: No Chest pain?: No  Respiratory Cough?: Yes Shortness of breath?: Yes  Endocrine Excessive thirst?: No  Musculoskeletal Back pain?: No Joint pain?: No  Neurological Headaches?: No Dizziness?: No  Psychologic Depression?: No Anxiety?: No  Physical Exam: BP 110/62   Pulse 93   Ht '6\' 2"'$  (1.88 m)   Wt 228 lb (103.4 kg)   BMI 29.27 kg/m   Constitutional:  Alert and oriented, No acute distress. HEENT: [place] AT, moist mucus membranes.  Trachea midline, no masses. Cardiovascular: No clubbing, cyanosis, or edema. Respiratory: Normal respiratory effort, no increased work of breathing. GI: Abdomen is soft, nontender, nondistended, no abdominal masses GU: No CVA tenderness. Normal phallus. Testicles bilaterally. Benign. Skin: No rashes, bruises or suspicious lesions. Lymph: No cervical or inguinal adenopathy. Neurologic: Grossly intact, no focal deficits, moving all 4 extremities. Psychiatric: Normal mood and affect.  Laboratory Data: Lab Results  Component Value Date   WBC 5.7 02/24/2017   HGB 15.0 02/24/2017   HCT 44.0 02/24/2017   MCV 85.2 02/24/2017   PLT 237 02/24/2017    Lab Results  Component Value Date   CREATININE 1.17 02/24/2017    Lab Results  Component Value Date   PSA 2.61 02/24/2017    No results found for: TESTOSTERONE  No results found for: HGBA1C  Urinalysis No results found for: COLORURINE, APPEARANCEUR, LABSPEC, PHURINE, GLUCOSEU, HGBUR, BILIRUBINUR, KETONESUR, PROTEINUR, UROBILINOGEN, NITRITE, LEUKOCYTESUR   Assessment & Plan:    1. Bladder stone The patient has a large stellate bladder stone seen on CT in August 2018. I did discuss this with him  in great detail. We discussed cystoscopy litholapaxy for treatment of this stone up. What complicates this patient as his multiple serious medical comorbidities. However my concern for this patient into this will continue to grow and potentially obstruct his urethra due to its geometry. We discussed the risks, benefits, indications a cystoscopy litholapaxy. He understands risks include bleeding, infection, incomplete stone recovery, bladder perforation, and death. We also discussed that he is a high-risk individual. We will need to get clearances from cardiology, pulmonology, and vascular surgery prior to proceeding. If any of these services feel that this surgery is too risky, we will not proceed with surgery. The patient does understand of his increased risks due to these morbidities. However again, this stone may become more of an issue.  We did also discuss that often the standard of care removing a large stone is also perform a TURP. However, the time of his stone development he was not a medication and his symptoms have resolved since that time. Also undergoing a TURP at significant more risks to this surgery.  2. Microscopic hematuria Secondary to above.  Negative PET scan in August 2017 for malignancy. Completion of workup will be performed in the cystolitholapaxy via cystoscopy.  3. BPH Continue Flomax  Nickie Retort, MD  [facility] [address] [place], [place] [address] [phone]

## 2017-04-11 ENCOUNTER — Telehealth: Payer: Self-pay | Admitting: Radiology

## 2017-04-11 ENCOUNTER — Other Ambulatory Visit (INDEPENDENT_AMBULATORY_CARE_PROVIDER_SITE_OTHER): Payer: Self-pay | Admitting: Vascular Surgery

## 2017-04-11 ENCOUNTER — Ambulatory Visit (INDEPENDENT_AMBULATORY_CARE_PROVIDER_SITE_OTHER): Payer: Medicare Other | Admitting: Vascular Surgery

## 2017-04-11 ENCOUNTER — Other Ambulatory Visit (INDEPENDENT_AMBULATORY_CARE_PROVIDER_SITE_OTHER): Payer: Self-pay

## 2017-04-11 ENCOUNTER — Other Ambulatory Visit (INDEPENDENT_AMBULATORY_CARE_PROVIDER_SITE_OTHER): Payer: Medicare Other

## 2017-04-11 ENCOUNTER — Encounter (INDEPENDENT_AMBULATORY_CARE_PROVIDER_SITE_OTHER): Payer: Self-pay | Admitting: Vascular Surgery

## 2017-04-11 VITALS — BP 110/68 | HR 70 | Resp 16 | Ht 74.0 in | Wt 227.0 lb

## 2017-04-11 DIAGNOSIS — I739 Peripheral vascular disease, unspecified: Secondary | ICD-10-CM

## 2017-04-11 DIAGNOSIS — Z9889 Other specified postprocedural states: Secondary | ICD-10-CM

## 2017-04-11 DIAGNOSIS — Z8679 Personal history of other diseases of the circulatory system: Secondary | ICD-10-CM | POA: Diagnosis not present

## 2017-04-11 DIAGNOSIS — C3412 Malignant neoplasm of upper lobe, left bronchus or lung: Secondary | ICD-10-CM | POA: Diagnosis not present

## 2017-04-11 DIAGNOSIS — I714 Abdominal aortic aneurysm, without rupture, unspecified: Secondary | ICD-10-CM

## 2017-04-11 NOTE — Assessment & Plan Note (Signed)
Recurrent.  Getting radiation.

## 2017-04-11 NOTE — Telephone Encounter (Signed)
Wife called back to confirm appts & when to hold ASA '325mg'$ . Questions answered to wife's satisfaction. Wife voices understanding.

## 2017-04-11 NOTE — Telephone Encounter (Signed)
LMOM. Advised pt to hold ASA '325mg'$  beginning 04/16/17 per clearance from Dr Ubaldo Glassing & notified pt of surgery scheduled 04/23/17 with Dr Pilar Jarvis & pre-admit testing appt. Requested return call to confirm.

## 2017-04-11 NOTE — Assessment & Plan Note (Signed)
No current lifestyle limiting symptoms.

## 2017-04-11 NOTE — Assessment & Plan Note (Signed)
His aortic duplex today reveals a stable repaired abdominal aortic aneurysm with a patent stent graft without endoleak. It has a maximal diameter of about 4.6 cm which is stable and unchanged from previous studies. This poses no increased risk with his upcoming surgery.  This can now be monitored every one to two years with duplex.

## 2017-04-11 NOTE — Progress Notes (Signed)
MRN : [id number]  Stephen [name] is a 76 y.o. ([birth year]) male who presents with chief complaint of  Chief Complaint  Patient presents with  . Carotid    1 year carotid ultrasound follow up  .  History of Present Illness: Patient returns today in follow up of AAA because of upcoming urologic surgery.  He is five years s/p endovascular repair.  He has had a recurrence of his lung cancer.  He had two episodes of hematuria where his urologic disease was identified. His aortic duplex today reveals a stable repaired abdominal aortic aneurysm with a patent stent graft without endoleak. It has a maximal diameter of about 4.6 cm which is stable and unchanged from previous studies.  Current Outpatient Prescriptions  Medication Sig Dispense Refill  . albuterol (PROVENTIL HFA;VENTOLIN HFA) 108 (90 Base) MCG/ACT inhaler Inhale 2 puffs into the lungs every 6 (six) hours as needed for wheezing or shortness of breath. 1 Inhaler 0  . AMBULATORY NON FORMULARY MEDICATION Medication Name: incentive spirometer 1 each 0  . aspirin 325 MG tablet Take 325 mg by mouth at bedtime.     . Cholecalciferol (VITAMIN D-3 PO) Take 1 tablet by mouth daily.     . Doxylamine Succinate, Sleep, (SLEEP AID PO) Take 1 tablet by mouth at bedtime.    . Flaxseed, Linseed, (FLAX SEEDS PO) Take by mouth.    . Glycopyrrolate-Formoterol (BEVESPI AEROSPHERE) 9-4.8 MCG/ACT AERO Inhale 2 puffs into the lungs 2 (two) times daily. 10.7 g 5  . guaiFENesin-codeine 100-10 MG/5ML syrup Take 5 mLs by mouth every 4 (four) hours as needed for cough. 120 mL 0  . ipratropium-albuterol (DUONEB) 0.5-2.5 (3) MG/3ML SOLN Take 3 mLs by nebulization every 6 (six) hours as needed. 360 mL 0  . Omega-3 Fatty Acids (FISH OIL) 1000 MG CAPS Take 1 capsule by mouth daily.    . tamsulosin (FLOMAX) 0.4 MG CAPS capsule Take 0.4 mg by mouth daily.    . TURMERIC PO Take by mouth.     No current facility-administered medications for this visit.     Past Medical  History:  Diagnosis Date  . Cancer [facility]) 2007   Lung CA- Bilateral  . Coronary artery disease   . Lung cancer ([place]) 07/16/2015  . Myocardial infarction [facility]) 2000   coronary stent  . Sarcoma [facility]) 2013   Right groin resection.   . Shortness of breath dyspnea   . Stroke [facility])     Past Surgical History:  Procedure Laterality Date  . ABDOMINAL AORTIC ANEURYSM REPAIR  10/28/2012   Dr Lucky Cowboy  . CAROTID ENDARTERECTOMY Right   . COLONOSCOPY  2013  . ENDOBRONCHIAL ULTRASOUND N/A 08/08/2016   Procedure: ENDOBRONCHIAL ULTRASOUND;  Surgeon: Vilinda Boehringer, MD;  Location: [facility] ORS;  Service: Cardiopulmonary;  Laterality: N/A;  . EYE SURGERY Bilateral 2010  . SKIN CANCER EXCISION Left   . THORACOTOMY Bilateral     Social History Social History  Substance Use Topics  . Smoking status: Former Smoker    Quit date: 07/31/2003  . Smokeless tobacco: Never Used  . Alcohol use No    Family History Family History  Problem Relation Age of Onset  . Hematuria Neg Hx   . Prostate cancer Neg Hx      No Known Allergies   REVIEW OF SYSTEMS (Negative unless checked)  Constitutional: _0 Weight loss  _1 Fever  _2 Chills Cardiac: _3 Chest pain   _4 Chest pressure   _5 Palpitations   _6 Shortness of breath when laying  flat   _0 Shortness of breath at rest   _1 Shortness of breath with exertion. Vascular:  _2 Pain in legs with walking   _3 Pain in legs at rest   _4 Pain in legs when laying flat   _5 Claudication   _6 Pain in feet when walking  _7 Pain in feet at rest  _8 Pain in feet when laying flat   _9 History of DVT   _10 Phlebitis   _11 Swelling in legs   _12 Varicose veins   _13 Non-healing ulcers Pulmonary:   _14 Uses home oxygen   _15 Productive cough   _16 Hemoptysis   _17 Wheeze  _18 COPD   _19 Asthma Neurologic:  _20 Dizziness  _21 Blackouts   _22 Seizures   _23 History of stroke   _24 History of TIA  _25 Aphasia   _26 Temporary blindness   _27 Dysphagia   _28 Weakness or numbness in arms   _29 Weakness or numbness in legs Musculoskeletal:   _30 Arthritis   _31 Joint swelling   _32 Joint pain   _33 Low back pain Hematologic:  _34 Easy bruising  _35 Easy bleeding   _36 Hypercoagulable state   _37 Anemic   Gastrointestinal:  _38 Blood in stool   _39 Vomiting blood  _40 Gastroesophageal reflux/heartburn   _41 Abdominal pain Genitourinary:  _42 Chronic kidney disease   _43 Difficult urination  _44 Frequent urination  _45 Burning with urination   _46 Hematuria Skin:  _47 Rashes   _48 Ulcers   _49 Wounds Psychological:  _50 History of anxiety   _51  History of major depression.  Physical Examination  BP 110/68 (BP Location: Right Arm)   Pulse 70   Resp 16   Ht _52  (1.88 m)   Wt 227 lb (103 kg)   BMI 29.15 kg/m  Gen:  WD/WN, NAD Head: [place]/AT, No temporalis wasting. Ear/Nose/Throat: Hearing grossly intact, nares w/o erythema or drainage, trachea midline Eyes: Conjunctiva clear. Sclera non-icteric Neck: Supple.  No JVD.  Pulmonary:  Good air movement, no use of accessory muscles.  Cardiac: RRR, normal S1, S2 Vascular:  Vessel Right Left  Radial Palpable Palpable                                   Gastrointestinal: soft, non-tender/non-distended. No increased aortic impulse. Musculoskeletal: M/S 5/5 throughout.  No deformity or atrophy.  Neurologic: Sensation grossly intact in extremities.  Symmetrical.  Speech is fluent.  Psychiatric: Judgment intact, Mood & affect appropriate for pt's clinical situation. Dermatologic: No rashes or ulcers noted.  No cellulitis or open wounds.       Labs Recent Results (from the past 2160 hour(s))  Comprehensive metabolic panel     Status: Abnormal   Collection Time: 02/24/17  1:30 PM  Result Value Ref Range   Sodium 138 135 - 145 mmol/L   Potassium 4.2 3.5 - 5.1 mmol/L   Chloride 104 101 - 111 mmol/L   CO2 26 22 - 32 mmol/L   Glucose, Bld 123 (H) 65 - 99 mg/dL   BUN 18 6 - 20 mg/dL   Creatinine, Ser 1.17 0.61 - 1.24 mg/dL   Calcium 9.2 8.9 - 10.3 mg/dL   Total Protein 6.6 6.5 - 8.1 g/dL   Albumin 3.6 3.5 - 5.0  g/dL   AST 26 15 - 41 U/L   ALT 20 17 - 63 U/L   Alkaline Phosphatase 56 38 - 126 U/L   Total Bilirubin 0.6 0.3 - 1.2 mg/dL   GFR calc non Af Amer 59 (L) >60 mL/min   GFR calc Af Amer >60 >60 mL/min    Comment: (NOTE) The eGFR has been calculated using  the CKD EPI equation. This calculation has not been validated in all clinical situations. eGFR's persistently <60 mL/min signify possible Chronic Kidney Disease.    Anion gap 8 5 - 15  CBC with Differential     Status: Abnormal   Collection Time: 02/24/17  1:30 PM  Result Value Ref Range   WBC 5.7 3.8 - 10.6 K/uL   RBC 5.17 4.40 - 5.90 MIL/uL   Hemoglobin 15.0 13.0 - 18.0 g/dL   HCT 44.0 40.0 - 52.0 %   MCV 85.2 80.0 - 100.0 fL   MCH 29.1 26.0 - 34.0 pg   MCHC 34.2 32.0 - 36.0 g/dL   RDW 16.0 (H) 11.5 - 14.5 %   Platelets 237 150 - 440 K/uL   Neutrophils Relative % 69 %   Neutro Abs 4.0 1.4 - 6.5 K/uL   Lymphocytes Relative 18 %   Lymphs Abs 1.0 1.0 - 3.6 K/uL   Monocytes Relative 10 %   Monocytes Absolute 0.5 0.2 - 1.0 K/uL   Eosinophils Relative 2 %   Eosinophils Absolute 0.1 0 - 0.7 K/uL   Basophils Relative 1 %   Basophils Absolute 0.1 0 - 0.1 K/uL  PSA     Status: None   Collection Time: 02/24/17  1:30 PM  Result Value Ref Range   PSA 2.61 0.00 - 4.00 ng/mL    Comment: (NOTE) While PSA levels of <=4.0 ng/ml are reported as reference range, some men with levels below 4.0 ng/ml can have prostate cancer and many men with PSA above 4.0 ng/ml do not have prostate cancer.  Other tests such as free PSA, age specific reference ranges, PSA velocity and PSA doubling time may be helpful especially in men less than 59 years old. Performed at [facility], [place] [address], [place], [place] [address]   Urinalysis, Complete     Status: Abnormal   Collection Time: 04/10/17  1:57 PM  Result Value Ref Range   Specific Gravity, UA 1.025 1.005 - 1.030   pH, UA 5.5 5.0 - 7.5   Color, UA Yellow Yellow   Appearance Ur Clear  Clear   Leukocytes, UA Trace (A) Negative   Protein, UA 1+ (A) Negative/Trace   Glucose, UA Negative Negative   Ketones, UA Negative Negative   RBC, UA 2+ (A) Negative   Bilirubin, UA Negative Negative   Urobilinogen, Ur 0.2 0.2 - 1.0 mg/dL   Nitrite, UA Negative Negative   Microscopic Examination See below:   Microscopic Examination     Status: Abnormal   Collection Time: 04/10/17  1:57 PM  Result Value Ref Range   WBC, UA 6-10 (A) 0 - 5 /hpf   RBC, UA 0-2 0 - 2 /hpf   Epithelial Cells (non renal) 0-10 0 - 10 /hpf   Mucus, UA Present (A) Not Estab.   Bacteria, UA Few (A) None seen/Few    Radiology No results found.    Assessment/Plan  Malignant neoplasm of upper lobe, left bronchus or lung (HCC) Recurrent.  Getting radiation.  Peripheral vascular disease ([place]) No current lifestyle limiting symptoms.  AAA (abdominal aortic aneurysm) without rupture His aortic duplex today reveals a stable repaired abdominal aortic aneurysm with a patent stent graft without endoleak. It has a maximal diameter of about 4.6 cm which is stable and unchanged from previous studies. This poses no increased risk with his upcoming surgery.  This can now be monitored every one to two years with duplex.  Leotis Pain, MD  04/11/2017 10:01 AM    This note was created with Dragon medical transcription system.  Any errors from dictation are purely unintentional

## 2017-04-12 LAB — URINE CULTURE

## 2017-04-16 ENCOUNTER — Other Ambulatory Visit: Payer: Medicare Other

## 2017-04-16 ENCOUNTER — Encounter
Admission: RE | Admit: 2017-04-16 | Discharge: 2017-04-16 | Disposition: A | Discharge status: Home / Self Care | Payer: Medicare Other | Source: Ambulatory Visit | Attending: Urology | Admitting: Urology

## 2017-04-16 ENCOUNTER — Other Ambulatory Visit: Payer: Self-pay | Admitting: Internal Medicine

## 2017-04-16 DIAGNOSIS — Z01818 Encounter for other preprocedural examination: Secondary | ICD-10-CM | POA: Insufficient documentation

## 2017-04-16 HISTORY — DX: Personal history of urinary calculi: Z87.442

## 2017-04-16 NOTE — Pre-Procedure Instructions (Signed)
Cardiac, pulmonary and vascular clearances located in front of chart.

## 2017-04-16 NOTE — Patient Instructions (Signed)
  Your procedure is scheduled on:Wednesday Apr 23, 2017. Report to Same Day Surgery. To find out your arrival time please call [phone] between 1PM - 3PM on Tuesday Apr 22, 2017.  Remember: Instructions that are not followed completely may result in serious medical risk, up to and including death, or upon the discretion of your surgeon and anesthesiologist your surgery may need to be rescheduled.    _x___ 1. Do not eat food or drink liquids after midnight. No gum chewing or  hard candies.     ____ 2. No Alcohol for 24 hours before or after surgery.   ____ 3. Bring all medications with you on the day of surgery if instructed.    __x__ 4. Notify your doctor if there is any change in your medical condition     (cold, fever, infections).    _____ 5. No smoking 24 hours prior to surgery.     Do not wear jewelry, make-up, hairpins, clips or nail polish.  Do not wear lotions, powders, or perfumes.   Do not shave 48 hours prior to surgery. Men may shave face and neck.  Do not bring valuables to the hospital.    [facility] is not responsible for any belongings or valuables.               Contacts, dentures or bridgework may not be worn into surgery.  Leave your suitcase in the car. After surgery it may be brought to your room.  For patients admitted to the hospital, discharge time is determined by your treatment team.   Patients discharged the day of surgery will not be allowed to drive home.    Please read over the following fact sheets that you were given:   [facility] Preparing for Surgery  ____ Take these medicines the morning of surgery with A SIP OF WATER: NONE    ____ Fleet Enema (as directed)   ____ Use CHG Soap as directed on instruction sheet  _x___ Use inhalers and nembulizer on the day of surgery and bring to hospital day of surgery  ____ Stop metformin 2 days prior to surgery    ____ Take 1/2 of usual insulin dose the night before surgery and none on the morning  of surgery.   __x__ Stop aspirin 7 days prior to surgery per Dr. Bethanne Ginger instructions.  __x__ Stop Anti-inflammatories such as Advil, Aleve, Ibuprofen, Motrin, Naproxen, Naprosyn, Goodies powders or aspirin products. OK to take Tylenol.   _x___ Stop supplements:COENZYME Q10, Flaxseed, Linseed, (FLAX SEEDS PO), Omega-3 Fatty Acids (FISH OIL) &VITAMIN E     until after surgery.    ____ Bring C-Pap to the hospital.

## 2017-04-23 ENCOUNTER — Ambulatory Visit: Payer: Medicare Other | Admitting: Registered Nurse

## 2017-04-23 ENCOUNTER — Encounter
Admission: RE | Disposition: A | Discharge status: Home / Self Care | Payer: Self-pay | Source: Ambulatory Visit | Attending: Urology

## 2017-04-23 ENCOUNTER — Encounter: Payer: Self-pay | Admitting: *Deleted

## 2017-04-23 ENCOUNTER — Ambulatory Visit
Admission: RE | Admit: 2017-04-23 | Discharge: 2017-04-23 | Disposition: A | Discharge status: Home / Self Care | Payer: Medicare Other | Source: Ambulatory Visit | Attending: Urology | Admitting: Urology

## 2017-04-23 DIAGNOSIS — Z955 Presence of coronary angioplasty implant and graft: Secondary | ICD-10-CM | POA: Insufficient documentation

## 2017-04-23 DIAGNOSIS — R319 Hematuria, unspecified: Secondary | ICD-10-CM | POA: Diagnosis present

## 2017-04-23 DIAGNOSIS — Z8673 Personal history of transient ischemic attack (TIA), and cerebral infarction without residual deficits: Secondary | ICD-10-CM | POA: Insufficient documentation

## 2017-04-23 DIAGNOSIS — J449 Chronic obstructive pulmonary disease, unspecified: Secondary | ICD-10-CM | POA: Diagnosis not present

## 2017-04-23 DIAGNOSIS — I739 Peripheral vascular disease, unspecified: Secondary | ICD-10-CM | POA: Insufficient documentation

## 2017-04-23 DIAGNOSIS — I252 Old myocardial infarction: Secondary | ICD-10-CM | POA: Insufficient documentation

## 2017-04-23 DIAGNOSIS — Z79899 Other long term (current) drug therapy: Secondary | ICD-10-CM | POA: Diagnosis not present

## 2017-04-23 DIAGNOSIS — Z87891 Personal history of nicotine dependence: Secondary | ICD-10-CM | POA: Insufficient documentation

## 2017-04-23 DIAGNOSIS — N210 Calculus in bladder: Secondary | ICD-10-CM | POA: Insufficient documentation

## 2017-04-23 DIAGNOSIS — I2510 Atherosclerotic heart disease of native coronary artery without angina pectoris: Secondary | ICD-10-CM | POA: Diagnosis not present

## 2017-04-23 DIAGNOSIS — Z7982 Long term (current) use of aspirin: Secondary | ICD-10-CM | POA: Insufficient documentation

## 2017-04-23 DIAGNOSIS — Z85118 Personal history of other malignant neoplasm of bronchus and lung: Secondary | ICD-10-CM | POA: Diagnosis not present

## 2017-04-23 DIAGNOSIS — N400 Enlarged prostate without lower urinary tract symptoms: Secondary | ICD-10-CM | POA: Insufficient documentation

## 2017-04-23 HISTORY — PX: CYSTOSCOPY WITH LITHOLAPAXY: SHX1425

## 2017-04-23 HISTORY — PX: HOLMIUM LASER APPLICATION: SHX5852

## 2017-04-23 SURGERY — CYSTOSCOPY, WITH BLADDER CALCULUS LITHOLAPAXY
Anesthesia: General | Wound class: Clean Contaminated

## 2017-04-23 MED ORDER — CEPHALEXIN 500 MG PO CAPS: 500.0000 mg | ORAL_CAPSULE | Freq: Three times a day (TID) | ORAL | 0 refills | Status: DC

## 2017-04-23 MED ORDER — PROPOFOL 10 MG/ML IV BOLUS
INTRAVENOUS | Status: AC
  Filled 2017-04-23: qty 20

## 2017-04-23 MED ORDER — SODIUM CHLORIDE 0.9 % IJ SOLN
INTRAMUSCULAR | Status: AC
  Filled 2017-04-23: qty 10

## 2017-04-23 MED ORDER — EPHEDRINE SULFATE 50 MG/ML IJ SOLN
INTRAMUSCULAR | Status: DC | PRN
  Administered 2017-04-23 (×2): 10 mg via INTRAVENOUS

## 2017-04-23 MED ORDER — OXYCODONE HCL 5 MG PO TABS: 5.0000 mg | ORAL_TABLET | Freq: Once | ORAL | Status: DC | PRN

## 2017-04-23 MED ORDER — ONDANSETRON HCL 4 MG/2ML IJ SOLN
INTRAMUSCULAR | Status: DC | PRN
  Administered 2017-04-23: 4 mg via INTRAVENOUS

## 2017-04-23 MED ORDER — CEFAZOLIN SODIUM-DEXTROSE 2-4 GM/100ML-% IV SOLN
INTRAVENOUS | Status: AC
  Administered 2017-04-23: 2 g via INTRAVENOUS
  Filled 2017-04-23: qty 100

## 2017-04-23 MED ORDER — FAMOTIDINE 20 MG PO TABS
ORAL_TABLET | ORAL | Status: AC
  Administered 2017-04-23: 20 mg via ORAL
  Filled 2017-04-23: qty 1

## 2017-04-23 MED ORDER — ONDANSETRON HCL 4 MG/2ML IJ SOLN
INTRAMUSCULAR | Status: AC
  Filled 2017-04-23: qty 2

## 2017-04-23 MED ORDER — PROPOFOL 10 MG/ML IV BOLUS
INTRAVENOUS | Status: DC | PRN
  Administered 2017-04-23: 160 mg via INTRAVENOUS

## 2017-04-23 MED ORDER — EPHEDRINE SULFATE 50 MG/ML IJ SOLN
INTRAMUSCULAR | Status: AC
  Filled 2017-04-23: qty 1

## 2017-04-23 MED ORDER — FENTANYL CITRATE (PF) 100 MCG/2ML IJ SOLN
INTRAMUSCULAR | Status: AC
Start: 2017-04-23 — End: 2017-04-23
  Filled 2017-04-23: qty 2

## 2017-04-23 MED ORDER — FENTANYL CITRATE (PF) 100 MCG/2ML IJ SOLN: 25.0000 ug | INTRAMUSCULAR | Status: DC | PRN

## 2017-04-23 MED ORDER — OXYCODONE HCL 5 MG/5ML PO SOLN: 5.0000 mg | Freq: Once | ORAL | Status: DC | PRN

## 2017-04-23 MED ORDER — HYDROCODONE-ACETAMINOPHEN 5-325 MG PO TABS: 1.0000 | ORAL_TABLET | ORAL | 0 refills | Status: DC | PRN

## 2017-04-23 MED ORDER — FENTANYL CITRATE (PF) 100 MCG/2ML IJ SOLN
INTRAMUSCULAR | Status: DC | PRN
  Administered 2017-04-23 (×5): 50 ug via INTRAVENOUS

## 2017-04-23 MED ORDER — FENTANYL CITRATE (PF) 100 MCG/2ML IJ SOLN
INTRAMUSCULAR | Status: AC
  Filled 2017-04-23: qty 2

## 2017-04-23 MED ORDER — PHENYLEPHRINE HCL 10 MG/ML IJ SOLN
INTRAMUSCULAR | Status: AC
  Filled 2017-04-23: qty 1

## 2017-04-23 MED ORDER — FAMOTIDINE 20 MG PO TABS
20.0000 mg | ORAL_TABLET | Freq: Once | ORAL | Status: AC
  Administered 2017-04-23: 20 mg via ORAL

## 2017-04-23 MED ORDER — IPRATROPIUM-ALBUTEROL 0.5-2.5 (3) MG/3ML IN SOLN
RESPIRATORY_TRACT | Status: AC
  Filled 2017-04-23: qty 3

## 2017-04-23 MED ORDER — LACTATED RINGERS IV SOLN
INTRAVENOUS | Status: DC
  Administered 2017-04-23 (×2): via INTRAVENOUS

## 2017-04-23 MED ORDER — CEFAZOLIN SODIUM-DEXTROSE 2-4 GM/100ML-% IV SOLN
2.0000 g | INTRAVENOUS | Status: AC
  Administered 2017-04-23: 2 g via INTRAVENOUS

## 2017-04-23 MED ORDER — PHENYLEPHRINE HCL 10 MG/ML IJ SOLN
INTRAMUSCULAR | Status: DC | PRN
Start: 2017-04-23 — End: 2017-04-23
  Administered 2017-04-23 (×5): 100 ug via INTRAVENOUS

## 2017-04-23 SURGICAL SUPPLY — 21 items
BACTOSHIELD CHG 4% 4OZ (MISCELLANEOUS) ×2
EVACUATOR ELLICK (MISCELLANEOUS) ×3 IMPLANT
FIBER LASER 1000 (Laser) ×3 IMPLANT
GLOVE BIO SURGEON STRL SZ7 (GLOVE) ×3 IMPLANT
GLOVE BIO SURGEON STRL SZ7.5 (GLOVE) ×3 IMPLANT
GOWN STRL REUS W/ TWL LRG LVL3 (GOWN DISPOSABLE) ×1 IMPLANT
GOWN STRL REUS W/ TWL XL LVL3 (GOWN DISPOSABLE) ×1 IMPLANT
GOWN STRL REUS W/TWL LRG LVL3 (GOWN DISPOSABLE) ×2
GOWN STRL REUS W/TWL XL LVL3 (GOWN DISPOSABLE) ×2
KIT RM TURNOVER CYSTO AR (KITS) ×3 IMPLANT
PACK CYSTO AR (MISCELLANEOUS) ×3 IMPLANT
SCRUB CHG 4% DYNA-HEX 4OZ (MISCELLANEOUS) ×1 IMPLANT
SENSORWIRE 0.038 NOT ANGLED (WIRE) ×3
SET CYSTO W/LG BORE CLAMP LF (SET/KITS/TRAYS/PACK) ×3 IMPLANT
SOL .9 NS 3000ML IRR  AL (IV SOLUTION) ×26
SOL .9 NS 3000ML IRR UROMATIC (IV SOLUTION) ×13 IMPLANT
SURGILUBE 2OZ TUBE FLIPTOP (MISCELLANEOUS) ×3 IMPLANT
SYRINGE IRR TOOMEY STRL 70CC (SYRINGE) ×3 IMPLANT
TUBING CONNECTING 10 (TUBING) ×2 IMPLANT
TUBING CONNECTING 10' (TUBING) ×1
WIRE SENSOR 0.038 NOT ANGLED (WIRE) ×1 IMPLANT

## 2017-04-23 NOTE — Op Note (Signed)
Date of procedure: 04/23/17  Preoperative diagnosis:  1. Bladder stone   Postoperative diagnosis:  1. Bladder stone   Procedure: 1. Cystoslitholapaxy with holmium laser 2. Fulguration of bleeders  Surgeon: Baruch Gouty, MD  Anesthesia: General  Complications: None  Intraoperative findings: The patient had a large approximately 5 cm very dense bladder stone that was broken into small fragments with the holmium laser and all fragments were evacuated.  EBL: None  Specimens: Bladder stone to pathology  Drains: None  Disposition: Stable to the postanesthesia care unit  Indication for procedure: The patient is a 76 y.o. male with very large bladder stone noted on a CT PET scan in August 2017. He presents today for definitive management.  After reviewing the management options for treatment, the patient elected to proceed with the above surgical procedure(s). We have discussed the potential benefits and risks of the procedure, side effects of the proposed treatment, the likelihood of the patient achieving the goals of the procedure, and any potential problems that might occur during the procedure or recuperation. Informed consent has been obtained.  Description of procedure: The patient was met in the preoperative area. All risks, benefits, and indications of the procedure were described in great detail. The patient consented to the procedure. Preoperative antibiotics were given. The patient was taken to the operative theater. General anesthesia was induced per the anesthesia service. The patient was then placed in the dorsal lithotomy position and prepped and draped in the usual sterile fashion. A preoperative timeout was called.   21 French 30 cystoscope was inserted into the patient's bladder per urethra atraumatically. He was noted to have visual obstruction and trilobar hypertrophy. Prior to procedure, etc. not to perform a TURP because his voiding symptoms had resolved with medical  therapy. The stone had developed prior to starting medical therapy for his urinary symptoms. The bladder stones noted in the approximate 5 cm in size. Pan cystoscopy was otherwise unremarkable. The stone was broken into small fragments of the holmium laser. Due to the very dense large size of the stone this took approximately 100 minutes. After the stone was broken into smaller pieces and evacuated with Ellik evacuator and all fragments were removed. He was noted to be some mild trauma to the median lobe of the prostate and evacuated. This area was fulgurated with Bugbee until hemostasis was obtained and excellent. The area for rate was only approximately 1 cm x 1 70. At this point hemostasis was obtained in all stone fragments removed. Patient's bladder was then drained to his Hessie Knows from anesthesia and transferred in stable condition to the postanesthesia care unit.  Plan: The patient will follow-up in one month to assess his progress. We will need to watch his PVRs closely to ensure that he does not develop a stone again.  Baruch Gouty, M.D.

## 2017-04-23 NOTE — Anesthesia Post-op Follow-up Note (Cosign Needed)
Anesthesia QCDR form completed.        

## 2017-04-23 NOTE — Progress Notes (Signed)
Report to [place].

## 2017-04-23 NOTE — Anesthesia Preprocedure Evaluation (Signed)
Anesthesia Evaluation  Patient identified by MRN, date of birth, ID band Patient awake    Reviewed: Allergy & Precautions, H&P , NPO status , Patient's Chart, lab work & pertinent test results  History of Anesthesia Complications Negative for: history of anesthetic complications  Airway Mallampati: III  TM Distance: >3 FB Neck ROM: limited    Dental  (+) Poor Dentition, Missing, Upper Dentures, Lower Dentures   Pulmonary shortness of breath, COPD, former smoker,    Pulmonary exam normal breath sounds clear to auscultation       Cardiovascular Exercise Tolerance: Good (-) angina+ CAD, + Past MI, + Peripheral Vascular Disease and + DOE  negative cardio ROS Normal cardiovascular exam Rhythm:regular Rate:Normal     Neuro/Psych CVA, Residual Symptoms negative psych ROS   GI/Hepatic negative GI ROS, Neg liver ROS, neg GERD  ,  Endo/Other  negative endocrine ROS  Renal/GU      Musculoskeletal   Abdominal   Peds  Hematology negative hematology ROS (+)   Anesthesia Other Findings Past Medical History: 2007: Cancer ([place])     Comment: Lung CA- Bilateral No date: Coronary artery disease No date: History of kidney stones 07/16/2015: Lung cancer ([place]) 2000: Myocardial infarction [facility])     Comment: coronary stent 2013: Sarcoma ([place])     Comment: Right groin resection.  No date: Shortness of breath dyspnea 2005: Stroke [facility])  Past Surgical History: 10/28/2012: ABDOMINAL AORTIC ANEURYSM REPAIR     Comment: Dr Lucky Cowboy No date: CAROTID ENDARTERECTOMY Right 2013: COLONOSCOPY 08/08/2016: ENDOBRONCHIAL ULTRASOUND N/A     Comment: Procedure: ENDOBRONCHIAL ULTRASOUND;  Surgeon:              Vilinda Boehringer, MD;  Location: [facility] ORS;                Service: Cardiopulmonary;  Laterality: N/A; 2010: EYE SURGERY Bilateral No date: SKIN CANCER EXCISION Left No date: THORACOTOMY Bilateral     Reproductive/Obstetrics negative OB  ROS                             Anesthesia Physical Anesthesia Plan  ASA: III  Anesthesia Plan: General LMA   Post-op Pain Management:    Induction: Intravenous  Airway Management Planned: LMA  Additional Equipment:   Intra-op Plan:   Post-operative Plan: Extubation in OR  Informed Consent: I have reviewed the patients History and Physical, chart, labs and discussed the procedure including the risks, benefits and alternatives for the proposed anesthesia with the patient or authorized representative who has indicated his/her understanding and acceptance.   Dental Advisory Given  Plan Discussed with: Anesthesiologist, CRNA and Surgeon  Anesthesia Plan Comments: (Patient consented for risks of anesthesia including but not limited to:  - adverse reactions to medications - damage to teeth, lips or other oral mucosa - sore throat or hoarseness - Damage to heart, brain, lungs or loss of life  Patient voiced understanding.  Patient informed that they are higher risk for complications from anesthesia during this procedure due to their medical history.  Patient voiced understanding. )        Anesthesia Quick Evaluation

## 2017-04-23 NOTE — Progress Notes (Signed)
Dr. Amie Critchley was notified of pt O2 Sat issue. Pt desats to about 88% on room air but easily increase ins Stas to 94-96%. Dr. Amie Critchley reports that he is ok with it and pt can go to Phase 2 of recovery. Will continue to monitor and tx pt according to MD orders. Pt is alert, calm and has no s/s of distress and reports that this is normal for him with his past lung Cancer history and partial lung removal.

## 2017-04-23 NOTE — Discharge Instructions (Signed)

## 2017-04-23 NOTE — Anesthesia Postprocedure Evaluation (Signed)
Anesthesia Post Note  Patient: Stephen [name]  Procedure(s) Performed: Procedure(s) (LRB): CYSTOSCOPY WITH LITHOLAPAXY (N/A) HOLMIUM LASER APPLICATION (N/A)  Patient location during evaluation: PACU Anesthesia Type: General Level of consciousness: awake and alert Pain management: pain level controlled Vital Signs Assessment: post-procedure vital signs reviewed and stable Respiratory status: spontaneous breathing, nonlabored ventilation, respiratory function stable and patient connected to nasal cannula oxygen Cardiovascular status: blood pressure returned to baseline and stable Postop Assessment: no signs of nausea or vomiting Anesthetic complications: no Comments: Patients baseline O2 saturation is in the lower 90s     Last Vitals:  Vitals:   04/23/17 1153 04/23/17 1200  BP: 139/62 (!) 137/53  Pulse: 88   Resp: 18   Temp: 36.8 C     Last Pain:  Vitals:   04/23/17 1153  TempSrc:   PainSc: 0-No pain                 Precious Haws Felton Buczynski

## 2017-04-23 NOTE — H&P (View-Only) (Signed)
04/10/2017 2:58 PM   Genelle Bal 1941/07/27 [id number]  Referring provider: [place] Pink, MD [address] [facility] [place], [place] [address]  Chief Complaint  Patient presents with  . 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 He also has a history of BPH and was started on flomax recently.  1. Microscopic hematuria PET scan in August 2017 showed normal kidneys and bladder and a large stellate bladder calculus.  2. BPH He was having significant urinary symptoms including nocturia, frequency, dysuria, and intermittency. A few weeks ago, he was started on Flomax and these have all resolved.    PMH: Past Medical History:  Diagnosis Date  . Cancer [facility]) 2007   Lung CA- Bilateral  . Coronary artery disease   . Lung cancer ([place]) 07/16/2015  . Myocardial infarction [facility]) 2000   coronary stent  . Sarcoma [facility]) 2013   Right groin resection.   . Shortness of breath dyspnea   . Stroke [facility])     Surgical History: Past Surgical History:  Procedure Laterality Date  . ABDOMINAL AORTIC ANEURYSM REPAIR  10/28/2012   Dr Lucky Cowboy  . CAROTID ENDARTERECTOMY Right   . COLONOSCOPY  2013  . ENDOBRONCHIAL ULTRASOUND N/A 08/08/2016   Procedure: ENDOBRONCHIAL ULTRASOUND;  Surgeon: Vilinda Boehringer, MD;  Location: [facility] ORS;  Service: Cardiopulmonary;  Laterality: N/A;  . EYE SURGERY Bilateral 2010  . SKIN CANCER EXCISION Left   . THORACOTOMY Bilateral     Home Medications:  Allergies as of 04/10/2017   No Known Allergies     Medication List       Accurate as of 04/10/17  2:58 PM. Always use your most recent med list.          albuterol 108 (90 Base) MCG/ACT  inhaler Commonly known as:  PROVENTIL HFA;VENTOLIN HFA Inhale 2 puffs into the lungs every 6 (six) hours as needed for wheezing or shortness of breath.   AMBULATORY NON FORMULARY MEDICATION Medication Name: incentive spirometer   aspirin 325 MG tablet Take 325 mg by mouth at bedtime.   Fish Oil 1000 MG Caps Take 1 capsule by mouth daily.   FLAX SEEDS PO Take by mouth.   Glycopyrrolate-Formoterol 9-4.8 MCG/ACT Aero Commonly known as:  BEVESPI AEROSPHERE Inhale 2 puffs into the lungs 2 (two) times daily.   guaiFENesin-codeine 100-10 MG/5ML syrup Take 5 mLs by mouth every 4 (four) hours as needed for cough.   ipratropium-albuterol 0.5-2.5 (3) MG/3ML Soln Commonly known as:  DUONEB Take 3 mLs by nebulization every 6 (six) hours as needed.   SLEEP AID PO Take 1 tablet by mouth at bedtime.   tamsulosin 0.4 MG Caps capsule Commonly known as:  FLOMAX Take 0.4 mg by mouth daily.   TURMERIC PO Take by mouth.   VITAMIN D-3 PO Take 1 tablet by mouth daily.       Allergies: No Known Allergies  Family History: Family History  Problem Relation Age of Onset  . Hematuria Neg Hx   . Prostate cancer Neg Hx     Social History:  reports that he quit smoking about 13 years ago. He has never used smokeless tobacco. He reports that he does not  drink alcohol or use drugs.  ROS: UROLOGY Frequent Urination?: Yes Hard to postpone urination?: No Burning/pain with urination?: Yes Get up at night to urinate?: Yes Leakage of urine?: No Urine stream starts and stops?: Yes Trouble starting stream?: No Do you have to strain to urinate?: No Blood in urine?: Yes Urinary tract infection?: No Sexually transmitted disease?: No Injury to kidneys or bladder?: No Painful intercourse?: No Weak stream?: No Erection problems?: No Penile pain?: No  Gastrointestinal Nausea?: No Vomiting?: No Indigestion/heartburn?: No Diarrhea?: No Constipation?: No  Constitutional Fever: No Night  sweats?: No Weight loss?: Yes Fatigue?: Yes  Skin Skin rash/lesions?: No Itching?: No  Eyes Blurred vision?: No Double vision?: No  Ears/Nose/Throat Sore throat?: No Sinus problems?: No  Hematologic/Lymphatic Swollen glands?: No Easy bruising?: No  Cardiovascular Leg swelling?: No Chest pain?: No  Respiratory Cough?: Yes Shortness of breath?: Yes  Endocrine Excessive thirst?: No  Musculoskeletal Back pain?: No Joint pain?: No  Neurological Headaches?: No Dizziness?: No  Psychologic Depression?: No Anxiety?: No  Physical Exam: BP 110/62   Pulse 93   Ht '6\' 2"'$  (1.88 m)   Wt 228 lb (103.4 kg)   BMI 29.27 kg/m   Constitutional:  Alert and oriented, No acute distress. HEENT: [place] AT, moist mucus membranes.  Trachea midline, no masses. Cardiovascular: No clubbing, cyanosis, or edema. Respiratory: Normal respiratory effort, no increased work of breathing. GI: Abdomen is soft, nontender, nondistended, no abdominal masses GU: No CVA tenderness. Normal phallus. Testicles bilaterally. Benign. Skin: No rashes, bruises or suspicious lesions. Lymph: No cervical or inguinal adenopathy. Neurologic: Grossly intact, no focal deficits, moving all 4 extremities. Psychiatric: Normal mood and affect.  Laboratory Data: Lab Results  Component Value Date   WBC 5.7 02/24/2017   HGB 15.0 02/24/2017   HCT 44.0 02/24/2017   MCV 85.2 02/24/2017   PLT 237 02/24/2017    Lab Results  Component Value Date   CREATININE 1.17 02/24/2017    Lab Results  Component Value Date   PSA 2.61 02/24/2017    No results found for: TESTOSTERONE  No results found for: HGBA1C  Urinalysis No results found for: COLORURINE, APPEARANCEUR, LABSPEC, PHURINE, GLUCOSEU, HGBUR, BILIRUBINUR, KETONESUR, PROTEINUR, UROBILINOGEN, NITRITE, LEUKOCYTESUR   Assessment & Plan:    1. Bladder stone The patient has a large stellate bladder stone seen on CT in August 2018. I did discuss this with him  in great detail. We discussed cystoscopy litholapaxy for treatment of this stone up. What complicates this patient as his multiple serious medical comorbidities. However my concern for this patient into this will continue to grow and potentially obstruct his urethra due to its geometry. We discussed the risks, benefits, indications a cystoscopy litholapaxy. He understands risks include bleeding, infection, incomplete stone recovery, bladder perforation, and death. We also discussed that he is a high-risk individual. We will need to get clearances from cardiology, pulmonology, and vascular surgery prior to proceeding. If any of these services feel that this surgery is too risky, we will not proceed with surgery. The patient does understand of his increased risks due to these morbidities. However again, this stone may become more of an issue.  We did also discuss that often the standard of care removing a large stone is also perform a TURP. However, the time of his stone development he was not a medication and his symptoms have resolved since that time. Also undergoing a TURP at significant more risks to this surgery.  2. Microscopic hematuria Secondary to above.  Negative PET scan in August 2017 for malignancy. Completion of workup will be performed in the cystolitholapaxy via cystoscopy.  3. BPH Continue Flomax  Nickie Retort, MD  [facility] [address], [place], [place] [address] [phone]

## 2017-04-23 NOTE — Transfer of Care (Signed)
Immediate Anesthesia Transfer of Care Note  Patient: Stephen [name]  Procedure(s) Performed: Procedure(s): CYSTOSCOPY WITH LITHOLAPAXY (N/A) HOLMIUM LASER APPLICATION (N/A)  Patient Location: PACU  Anesthesia Type:General  Level of Consciousness: sedated and responds to stimulation  Airway & Oxygen Therapy: Patient Spontanous Breathing and Patient connected to face mask oxygen  Post-op Assessment: Report given to RN and Post -op Vital signs reviewed and stable  Post vital signs: Reviewed and stable  Last Vitals:  Vitals:   04/23/17 0608 04/23/17 0952  BP: 117/62 (!) 147/73  Pulse:  100  Resp: 20   Temp: 36.7 C     Last Pain:  Vitals:   04/23/17 0608  TempSrc: Oral         Complications: No apparent anesthesia complications

## 2017-04-23 NOTE — Interval H&P Note (Signed)
History and Physical Interval Note:  04/23/2017 7:36 AM  Stephen [name]  has presented today for surgery, with the diagnosis of BLADDER STONE  The various methods of treatment have been discussed with the patient and family. After consideration of risks, benefits and other options for treatment, the patient has consented to  Procedure(s): CYSTOSCOPY WITH LITHOLAPAXY (N/A) HOLMIUM LASER APPLICATION (N/A) as a surgical intervention .  The patient's history has been reviewed, patient examined, no change in status, stable for surgery.  I have reviewed the patient's chart and labs.  Questions were answered to the patient's satisfaction.    RRR Lungs clear  Nickie Retort

## 2017-04-23 NOTE — Progress Notes (Signed)
Dr. Amie Critchley aware of oxygen saturations when oxygen Turned off, drops to 89 or 87.  Wants a duoneb And encourage incentive spirometer.

## 2017-04-23 NOTE — Anesthesia Procedure Notes (Signed)
Procedure Name: LMA Insertion Performed by: Lance Muss Pre-anesthesia Checklist: Patient identified, Patient being monitored, Timeout performed, Emergency Drugs available and Suction available Patient Re-evaluated:Patient Re-evaluated prior to inductionOxygen Delivery Method: Circle system utilized Preoxygenation: Pre-oxygenation with 100% oxygen Intubation Type: IV induction LMA: LMA inserted LMA Size: 4.5 Tube type: Oral Number of attempts: 2 Placement Confirmation: positive ETCO2 and breath sounds checked- equal and bilateral Tube secured with: Tape Dental Injury: Teeth and Oropharynx as per pre-operative assessment

## 2017-05-01 LAB — STONE ANALYSIS
CA OXALATE, DIHYDRATE: 10 %
Ca Oxalate,Monohydr.: 85 %
Ca phos cry stone ql IR: 5 %
Stone Weight KSTONE: 19454 mg

## 2017-05-05 ENCOUNTER — Other Ambulatory Visit (INDEPENDENT_AMBULATORY_CARE_PROVIDER_SITE_OTHER): Payer: Self-pay | Admitting: Vascular Surgery

## 2017-05-05 DIAGNOSIS — I70219 Atherosclerosis of native arteries of extremities with intermittent claudication, unspecified extremity: Secondary | ICD-10-CM

## 2017-05-05 DIAGNOSIS — I6523 Occlusion and stenosis of bilateral carotid arteries: Secondary | ICD-10-CM

## 2017-05-06 ENCOUNTER — Encounter (INDEPENDENT_AMBULATORY_CARE_PROVIDER_SITE_OTHER): Payer: Self-pay | Admitting: Vascular Surgery

## 2017-05-06 ENCOUNTER — Ambulatory Visit (INDEPENDENT_AMBULATORY_CARE_PROVIDER_SITE_OTHER): Payer: Self-pay | Admitting: Vascular Surgery

## 2017-05-06 ENCOUNTER — Ambulatory Visit (INDEPENDENT_AMBULATORY_CARE_PROVIDER_SITE_OTHER): Payer: Medicare Other | Admitting: Vascular Surgery

## 2017-05-06 ENCOUNTER — Ambulatory Visit (INDEPENDENT_AMBULATORY_CARE_PROVIDER_SITE_OTHER): Payer: Medicare Other

## 2017-05-06 ENCOUNTER — Other Ambulatory Visit (INDEPENDENT_AMBULATORY_CARE_PROVIDER_SITE_OTHER): Payer: Self-pay

## 2017-05-06 VITALS — BP 122/69 | HR 68 | Resp 16 | Wt 220.0 lb

## 2017-05-06 DIAGNOSIS — I714 Abdominal aortic aneurysm, without rupture, unspecified: Secondary | ICD-10-CM

## 2017-05-06 DIAGNOSIS — I6523 Occlusion and stenosis of bilateral carotid arteries: Secondary | ICD-10-CM

## 2017-05-06 DIAGNOSIS — I70219 Atherosclerosis of native arteries of extremities with intermittent claudication, unspecified extremity: Secondary | ICD-10-CM | POA: Diagnosis not present

## 2017-05-06 DIAGNOSIS — I6529 Occlusion and stenosis of unspecified carotid artery: Secondary | ICD-10-CM | POA: Insufficient documentation

## 2017-05-06 DIAGNOSIS — I739 Peripheral vascular disease, unspecified: Secondary | ICD-10-CM | POA: Diagnosis not present

## 2017-05-06 DIAGNOSIS — C3412 Malignant neoplasm of upper lobe, left bronchus or lung: Secondary | ICD-10-CM | POA: Diagnosis not present

## 2017-05-06 LAB — VAS US CAROTID
LEFT ECA DIAS: -6 cm/s
LEFT VERTEBRAL DIAS: 12 cm/s
LICADDIAS: -10 cm/s
Left CCA dist dias: -17 cm/s
Left CCA dist sys: -115 cm/s
Left ICA dist sys: -55 cm/s
Left ICA prox dias: -23 cm/s
Left ICA prox sys: -164 cm/s
RIGHT CCA MID DIAS: 16 cm/s
RIGHT ECA DIAS: -5 cm/s
RIGHT VERTEBRAL DIAS: 13 cm/s
Right CCA prox dias: 7 cm/s
Right CCA prox sys: 37 cm/s
Right cca dist sys: -81 cm/s

## 2017-05-06 NOTE — Progress Notes (Signed)
  MRN : [id number]  Stephen [name] is a 76 y.o. (04/17/1941) male who presents with chief complaint of  Chief Complaint  Patient presents with  . Follow-up  .  History of Present Illness: Patient returns today in follow up of Multiple vascular issues. He is doing well today. He has completed therapy for recurrent lung cancer and has tolerated this well. He has no new focal neurologic symptoms. His carotid duplex today reveals a widely patent right carotid artery stent and mild intimal hyperplasia and a left carotid artery endarterectomy from over 10 years ago creating only mild stenosis. This is stable from his previous study. He is also studied with ABIs for his peripheral vascular disease. He did not have any lifestyle limiting claudication, ischemic rest pain, or tissue loss. His ABIs today are stable at 0.70 on the right and 1.08 on the left  Current Outpatient Prescriptions  Medication Sig Dispense Refill  . albuterol (PROVENTIL HFA;VENTOLIN HFA) 108 (90 Base) MCG/ACT inhaler Inhale 2 puffs into the lungs every 6 (six) hours as needed for wheezing or shortness of breath. 1 Inhaler 0  . AMBULATORY NON FORMULARY MEDICATION Medication Name: incentive spirometer 1 each 0  . aspirin 325 MG tablet Take 325 mg by mouth at bedtime.     . Cholecalciferol (VITAMIN D-3 PO) Take 1 tablet by mouth daily.     . Doxylamine Succinate, Sleep, (SLEEP AID PO) Take 1 tablet by mouth at bedtime.    . Flaxseed, Linseed, (FLAX SEEDS PO) Take by mouth.    . Glycopyrrolate-Formoterol (BEVESPI AEROSPHERE) 9-4.8 MCG/ACT AERO Inhale 2 puffs into the lungs 2 (two) times daily. 10.7 g 5  . guaiFENesin-codeine 100-10 MG/5ML syrup Take 5 mLs by mouth every 4 (four) hours as needed for cough. 120 mL 0  . ipratropium-albuterol (DUONEB) 0.5-2.5 (3) MG/3ML SOLN Take 3 mLs by nebulization every 6 (six) hours as needed. 360 mL 0  . Omega-3 Fatty Acids (FISH OIL) 1000 MG CAPS Take 1 capsule by mouth daily.    .  tamsulosin (FLOMAX) 0.4 MG CAPS capsule Take 0.4 mg by mouth daily.    . TURMERIC PO Take by mouth.     No current facility-administered medications for this visit.         Past Medical History:  Diagnosis Date  . Cancer (HCC) 2007   Lung CA- Bilateral  . Coronary artery disease   . Lung cancer (HCC) 07/16/2015  . Myocardial infarction (HCC) 2000   coronary stent  . 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   . Shortness of breath dyspnea   . Stroke (HCC)          Past Surgical History:  Procedure Laterality Date  . ABDOMINAL AORTIC ANEURYSM REPAIR  10/28/2012   Dr Dew  . CAROTID ENDARTERECTOMY Right   . COLONOSCOPY  2013  . ENDOBRONCHIAL ULTRASOUND N/A 08/08/2016   Procedure: ENDOBRONCHIAL ULTRASOUND;  Surgeon: Vishal Mungal, MD;  Location: [facility] ORS;  Service: Cardiopulmonary;  Laterality: N/A;  . EYE SURGERY Bilateral 2010  . SKIN CANCER EXCISION Left   . THORACOTOMY Bilateral     Social History      Social History  Substance Use Topics  . Smoking status: Former Smoker    Quit date: 07/31/2003  . Smokeless tobacco: Never Used  . Alcohol use No    Family History      Family History  Problem Relation Age of Onset  . Hematuria Neg Hx   . Prostate   cancer Neg Hx      No Known Allergies   REVIEW OF SYSTEMS (Negative unless checked)  Constitutional: []Weight loss  []Fever  []Chills Cardiac: []Chest pain   []Chest pressure   []Palpitations   []Shortness of breath when laying flat   []Shortness of breath at rest   []Shortness of breath with exertion. Vascular:  []Pain in legs with walking   []Pain in legs at rest   []Pain in legs when laying flat   []Claudication   []Pain in feet when walking  []Pain in feet at rest  []Pain in feet when laying flat   []History of DVT   []Phlebitis   []Swelling in legs   []Varicose veins   []Non-healing ulcers Pulmonary:   []Uses home oxygen   [x]Productive cough   []Hemoptysis   []Wheeze  []COPD    []Asthma Neurologic:  []Dizziness  []Blackouts   []Seizures   []History of stroke   []History of TIA  []Aphasia   []Temporary blindness   []Dysphagia   []Weakness or numbness in arms   []Weakness or numbness in legs Musculoskeletal:  [x]Arthritis   []Joint swelling   []Joint pain   []Low back pain Hematologic:  []Easy bruising  []Easy bleeding   []Hypercoagulable state   []Anemic   Gastrointestinal:  []Blood in stool   []Vomiting blood  []Gastroesophageal reflux/heartburn   []Abdominal pain Genitourinary:  []Chronic kidney disease   []Difficult urination  []Frequent urination  []Burning with urination   [x]Hematuria Skin:  []Rashes   []Ulcers   []Wounds Psychological:  []History of anxiety   [] History of major depression.   Physical Examination  BP 122/69   Pulse 68   Resp 16   Wt 220 lb (99.8 kg)   BMI 28.25 kg/m  Gen:  WD/WN, NAD Head: [place]/AT, No temporalis wasting. Ear/Nose/Throat: Hearing grossly intact, nares w/o erythema or drainage, trachea midline Eyes: Conjunctiva clear. Sclera non-icteric Neck: Supple.  No JVD.  Pulmonary:  Good air movement, no use of accessory muscles.  Cardiac: RRR, normal S1, S2 Vascular:  Vessel Right Left  Radial Palpable Palpable  Ulnar Palpable Palpable  Brachial Palpable Palpable  Carotid Palpable, without bruit Palpable, without bruit  Aorta Not palpable N/A  Femoral 1+ Palpable Palpable  Popliteal Not Palpable Palpable  PT 1+ Palpable 1+ Palpable  DP 1+ Palpable Palpable   Gastrointestinal: soft, non-tender/non-distended.  Musculoskeletal: M/S 5/5 throughout.  No deformity or atrophy.  Neurologic: Sensation grossly intact in extremities.  Symmetrical.  Speech is fluent.  Psychiatric: Judgment intact, Mood & affect appropriate for pt's clinical situation. Dermatologic: No rashes or ulcers noted.  No cellulitis or open wounds.       Labs Recent Results (from the past 2160 hour(s))  Comprehensive metabolic panel     Status:  Abnormal   Collection Time: 02/24/17  1:30 PM  Result Value Ref Range   Sodium 138 135 - 145 mmol/L   Potassium 4.2 3.5 - 5.1 mmol/L   Chloride 104 101 - 111 mmol/L   CO2 26 22 - 32 mmol/L   Glucose, Bld 123 (H) 65 - 99 mg/dL   BUN 18 6 - 20 mg/dL   Creatinine, Ser 1.17 0.61 - 1.24 mg/dL   Calcium 9.2 8.9 - 10.3 mg/dL   Total Protein 6.6 6.5 - 8.1 g/dL   Albumin 3.6 3.5 - 5.0 g/dL   AST 26 15 - 41 U/L   ALT 20 17 - 63 U/L   Alkaline Phosphatase 56 38 - 126   U/L   Total Bilirubin 0.6 0.3 - 1.2 mg/dL   GFR calc non Af Amer 59 (L) >60 mL/min   GFR calc Af Amer >60 >60 mL/min    Comment: (NOTE) The eGFR has been calculated using the CKD EPI equation. This calculation has not been validated in all clinical situations. eGFR's persistently <60 mL/min signify possible Chronic Kidney Disease.    Anion gap 8 5 - 15  CBC with Differential     Status: Abnormal   Collection Time: 02/24/17  1:30 PM  Result Value Ref Range   WBC 5.7 3.8 - 10.6 K/uL   RBC 5.17 4.40 - 5.90 MIL/uL   Hemoglobin 15.0 13.0 - 18.0 g/dL   HCT 44.0 40.0 - 52.0 %   MCV 85.2 80.0 - 100.0 fL   MCH 29.1 26.0 - 34.0 pg   MCHC 34.2 32.0 - 36.0 g/dL   RDW 16.0 (H) 11.5 - 14.5 %   Platelets 237 150 - 440 K/uL   Neutrophils Relative % 69 %   Neutro Abs 4.0 1.4 - 6.5 K/uL   Lymphocytes Relative 18 %   Lymphs Abs 1.0 1.0 - 3.6 K/uL   Monocytes Relative 10 %   Monocytes Absolute 0.5 0.2 - 1.0 K/uL   Eosinophils Relative 2 %   Eosinophils Absolute 0.1 0 - 0.7 K/uL   Basophils Relative 1 %   Basophils Absolute 0.1 0 - 0.1 K/uL  PSA     Status: None   Collection Time: 02/24/17  1:30 PM  Result Value Ref Range   PSA 2.61 0.00 - 4.00 ng/mL    Comment: (NOTE) While PSA levels of <=4.0 ng/ml are reported as reference range, some men with levels below 4.0 ng/ml can have prostate cancer and many men with PSA above 4.0 ng/ml do not have prostate cancer.  Other tests such as free PSA, age specific reference ranges, PSA  velocity and PSA doubling time may be helpful especially in men less than 71 years old. Performed at [facility], [place] [address] [place], [place] [address]   Urinalysis, Complete     Status: Abnormal   Collection Time: 04/10/17  1:57 PM  Result Value Ref Range   Specific Gravity, UA 1.025 1.005 - 1.030   pH, UA 5.5 5.0 - 7.5   Color, UA Yellow Yellow   Appearance Ur Clear Clear   Leukocytes, UA Trace (A) Negative   Protein, UA 1+ (A) Negative/Trace   Glucose, UA Negative Negative   Ketones, UA Negative Negative   RBC, UA 2+ (A) Negative   Bilirubin, UA Negative Negative   Urobilinogen, Ur 0.2 0.2 - 1.0 mg/dL   Nitrite, UA Negative Negative   Microscopic Examination See below:   Microscopic Examination     Status: Abnormal   Collection Time: 04/10/17  1:57 PM  Result Value Ref Range   WBC, UA 6-10 (A) 0 - 5 /hpf   RBC, UA 0-2 0 - 2 /hpf   Epithelial Cells (non renal) 0-10 0 - 10 /hpf   Mucus, UA Present (A) Not Estab.   Bacteria, UA Few (A) None seen/Few  Urine culture     Status: None   Collection Time: 04/10/17  2:51 PM  Result Value Ref Range   Urine Culture, Routine Final report    Urine Culture result 1 Comment     Comment: Culture shows less than 10,000 colony forming units of bacteria per milliliter of urine. This colony count is not generally considered  to be clinically significant.   Stone analysis     Status: None   Collection Time: 04/23/17  9:44 AM  Result Value Ref Range   Color [place]    Size Comment mm    Comment: Specimen received as fragments.   Stone Weight KSTONE 19,454 mg   Nidus No Nidus visualized    Ca Oxalate,Dihydrate 10 %   Ca Oxalate,Monohydr. 85 %   Ca phos cry stone ql IR 5 %   Composition Comment     Comment: Percentage (Represents the % composition)   STONE COMMENT Note:     Comment: (NOTE) Please do not submit specimens on Q-Tips, in tape, on filters, or in liquids such as blood, urine or formalin.  This may  cause unnecessary biohazards, erroneous results and/or delay in the processing of the specimen.    Photo Comment     Comment: Photograph will follow under separate cover.   Comment: Comment     Comment: (NOTE) Physician questions regarding Calculi Analysis contact [facility] at: [phone].    PLEASE NOTE: Comment     Comment: (NOTE) Calculi report with photograph will follow via computer, mail or courier delivery.    Disclaimer - Kidney Stone Analysis: Comment     Comment: (NOTE) This test was developed and its performance characteristics determined by [facility]. It has not been cleared or approved by the Food and Drug Administration. Performed At: [facility] [place] [address] [place], [place] [id number] [place] William F MD Ph:[phone]   VAS US CAROTID     Status: None (In process)   Collection Time: 05/06/17  8:45 AM  Result Value Ref Range   Right CCA prox sys 37 cm/s   Right CCA prox dias 7 cm/s   Right cca dist sys -81 cm/s   Left CCA dist sys -115 cm/s   Left CCA dist dias -17 cm/s   Left ICA prox sys -164 cm/s   Left ICA prox dias -23 cm/s   Left ICA dist sys -55 cm/s   Left ICA dist dias -10 cm/s   RIGHT CCA MID DIAS 16.00 cm/s   RIGHT ECA DIAS -5.00 cm/s   RIGHT VERTEBRAL DIAS 13.00 cm/s   LEFT ECA DIAS -6.00 cm/s   LEFT VERTEBRAL DIAS 12.00 cm/s    Radiology No results found.    Assessment/Plan  Malignant neoplasm of upper lobe, left bronchus or lung (HCC) Recurrent.  Has completed radiation and tolerated this reasonably well  Peripheral vascular disease (HCC) No current lifestyle limiting symptoms. ABIs are stable at 0.7 on the right and 1.08 on the left. Plan to recheck in 1 year.  AAA (abdominal aortic aneurysm) without rupture His aortic duplex last month reveals a stable repaired abdominal aortic aneurysm with a patent stent graft without endoleak. It has a maximal diameter of about 4.6 cm which is stable and unchanged from previous  studies. This poses no increased risk with his upcoming surgery.  This can now be monitored every one to two years with duplex.  Carotid stenosis His carotid duplex today reveals a widely patent right carotid artery stent and mild intimal hyperplasia and a left carotid artery endarterectomy from over 10 years ago creating only mild stenosis. This is stable from his previous study. Continue current medical regimen including aspirin 325 mg daily. Recheck in 1 year    Jason Dew, MD  05/06/2017 9:48 AM    This note was created with Dragon medical transcription system.  Any errors from dictation are   purely unintentional

## 2017-05-06 NOTE — Assessment & Plan Note (Signed)
His carotid duplex today reveals a widely patent right carotid artery stent and mild intimal hyperplasia and a left carotid artery endarterectomy from over 10 years ago creating only mild stenosis. This is stable from his previous study. Continue current medical regimen including aspirin 325 mg daily. Recheck in 1 year

## 2017-05-29 ENCOUNTER — Ambulatory Visit: Payer: Medicare Other | Admitting: Urology

## 2017-05-29 ENCOUNTER — Ambulatory Visit: Payer: Medicare Other

## 2017-06-10 ENCOUNTER — Ambulatory Visit (INDEPENDENT_AMBULATORY_CARE_PROVIDER_SITE_OTHER): Payer: Medicare Other | Admitting: Internal Medicine

## 2017-06-10 ENCOUNTER — Encounter: Payer: Self-pay | Admitting: Internal Medicine

## 2017-06-10 VITALS — BP 90/58 | HR 89 | Resp 16 | Ht 74.0 in | Wt 219.0 lb

## 2017-06-10 DIAGNOSIS — J449 Chronic obstructive pulmonary disease, unspecified: Secondary | ICD-10-CM | POA: Diagnosis not present

## 2017-06-10 MED ORDER — TIOTROPIUM BROMIDE MONOHYDRATE 2.5 MCG/ACT IN AERS: 2.5000 ug | INHALATION_SPRAY | Freq: Two times a day (BID) | RESPIRATORY_TRACT | 12 refills | Status: AC

## 2017-06-10 NOTE — Progress Notes (Signed)
[facility] Consultation      MRN# [id number] Stephen [name] 10/15/1941   CC: Chief Complaint  Patient presents with  . COPD    Pt still has sob with exhertion, patient denies wheeze, chest tightness. He has dry cough.      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 Treating for COPD  CC follow up COPD HPI Patient presents today for follow-up visit of his COPD, left hilar mass. Denies any sick contacts, chills, fevers. Patient states that he's been getting radiation therapy and has follow up with rad onc Results of his hilar mass shows findings consistent with metastatic sarcoma. Today he does still endorse some mild shortness of breath, cough,Wheezing has stopped  He denies any smoking.  Breo has not helped Nebulizer therapy has worked No signs of infection at this time No signs of CHF at this time    Current Outpatient Prescriptions:  .  albuterol (PROVENTIL HFA;VENTOLIN HFA) 108 (90 Base) MCG/ACT inhaler, Inhale 2 puffs into the lungs every 6 (six) hours as needed for wheezing or shortness of breath., Disp: 1 Inhaler, Rfl: 0 .  AMBULATORY NON FORMULARY MEDICATION, Medication Name: incentive spirometer, Disp: 1 each, Rfl: 0 .  aspirin 325 MG tablet, Take 325 mg by mouth at bedtime. , Disp: , Rfl:  .  Cholecalciferol (VITAMIN D) 2000 units tablet, Take 2,000 Units by mouth daily., Disp: , Rfl:  .  COENZYME Q10 PO, Take 1 tablet by mouth daily., Disp: , Rfl:  .  Doxylamine Succinate, Sleep, (SLEEP AID PO), Take 1 tablet by mouth at bedtime., Disp: , Rfl:  .  ipratropium-albuterol (DUONEB) 0.5-2.5 (3) MG/3ML SOLN, Use one vial via nebulizer every 6 (six) hours as needed., Disp:  360 mL, Rfl: 1 .  Omega-3 Fatty Acids (FISH OIL) 1000 MG CAPS, Take 1,000 mg by mouth daily. , Disp: , Rfl:  .  tamsulosin (FLOMAX) 0.4 MG CAPS capsule, Take 0.4 mg by mouth at bedtime., Disp: , Rfl:  .  vitamin B-12 (CYANOCOBALAMIN) 1000 MCG tablet, Take 1,000 mcg by mouth daily., Disp: , Rfl:  .  VITAMIN E PO, Take 1 capsule by mouth daily., Disp: , Rfl:    Review of Systems  Constitutional: Negative for chills and fever.  Eyes: Negative for blurred vision and double vision.  Respiratory: Positive for cough and shortness of breath. Negative for hemoptysis, sputum production and wheezing.   Gastrointestinal: Negative for heartburn and nausea.  Genitourinary: Negative for dysuria.  Musculoskeletal: Negative for myalgias.  Skin: Negative for rash.  Neurological: Negative for dizziness and headaches.  Endo/Heme/Allergies: Does not bruise/bleed easily.  Psychiatric/Behavioral: Negative for depression.      Allergies:  Patient has no known allergies.  Physical Examination:  VS: BP (!) 90/58 (BP Location: Right Arm, Cuff Size: Normal)   Pulse 89   Resp 16   Ht 6\' 2"  (1.88 m)   Wt 219 lb (99.3 kg)   SpO2 92%   BMI 28.12 kg/m   General Appearance: No distress  HEENT: PERRLA, no ptosis, no other lesions noticed Pulmonary:normal breath sounds., diaphragmatic excursion normal.No wheezing, No rales   Cardiovascular:  Normal S1,S2.  No m/r/g.     Abdomen:Exam: Benign, Soft, non-tender, No masses  Skin:  warm, no rashes, no ecchymosis  Extremities: normal, no cyanosis, clubbing, warm with normal capillary refill.      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   Overall his COPD seems to be under control and  no signs of exacerbation at this time has intermittent cough that is controlled with nebulizer therapy. The Breo did not help so we will start new inhaler with Spiriva respimat at 2.5  COPD -We will stop Brio Will start Spiriva and assess respiratory status at next visit -Nebulizer machine with DuoNeb treatment: 1 DuoNeb treatment 4 times a day as needed for shortness of breath/wheezing/recurrent cough -Avoid any forms of secondhand smoking, electronic cigarettes, vapors, etc. 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  Following with hematology/oncology, and radiation oncology. -will have surveillance scanning done in the near future.  Plan: -Continue with plan as outlined by hematology oncology, and radiation oncology  Follow up in 6 months  Patient/Family are satisfied with Plan of action and management. All questions answered  Corrin Parker, M.D.  [facility]  Medical Director [place] Director [facility] Cardio-Pulmonary Department

## 2017-06-10 NOTE — Patient Instructions (Signed)
Stop breo  Start Spiriva respimat 2.5

## 2017-06-16 ENCOUNTER — Ambulatory Visit
Admission: RE | Admit: 2017-06-16 | Discharge: 2017-06-16 | Disposition: A | Discharge status: Home / Self Care | Payer: Medicare Other | Source: Ambulatory Visit | Attending: Internal Medicine | Admitting: Internal Medicine

## 2017-06-16 ENCOUNTER — Inpatient Hospital Stay: Payer: Medicare Other | Attending: Internal Medicine | Admitting: Internal Medicine

## 2017-06-16 VITALS — BP 118/73 | HR 93 | Temp 97.6°F | Resp 20 | Ht 74.0 in | Wt 219.0 lb

## 2017-06-16 DIAGNOSIS — Z85828 Personal history of other malignant neoplasm of skin: Secondary | ICD-10-CM | POA: Insufficient documentation

## 2017-06-16 DIAGNOSIS — Z7982 Long term (current) use of aspirin: Secondary | ICD-10-CM | POA: Diagnosis not present

## 2017-06-16 DIAGNOSIS — J90 Pleural effusion, not elsewhere classified: Secondary | ICD-10-CM | POA: Insufficient documentation

## 2017-06-16 DIAGNOSIS — R05 Cough: Secondary | ICD-10-CM | POA: Insufficient documentation

## 2017-06-16 DIAGNOSIS — C3412 Malignant neoplasm of upper lobe, left bronchus or lung: Secondary | ICD-10-CM | POA: Insufficient documentation

## 2017-06-16 DIAGNOSIS — Z8673 Personal history of transient ischemic attack (TIA), and cerebral infarction without residual deficits: Secondary | ICD-10-CM | POA: Diagnosis not present

## 2017-06-16 DIAGNOSIS — Z923 Personal history of irradiation: Secondary | ICD-10-CM | POA: Insufficient documentation

## 2017-06-16 DIAGNOSIS — J700 Acute pulmonary manifestations due to radiation: Secondary | ICD-10-CM | POA: Diagnosis not present

## 2017-06-16 DIAGNOSIS — I252 Old myocardial infarction: Secondary | ICD-10-CM | POA: Insufficient documentation

## 2017-06-16 DIAGNOSIS — Z902 Acquired absence of lung [part of]: Secondary | ICD-10-CM | POA: Diagnosis not present

## 2017-06-16 DIAGNOSIS — Z79899 Other long term (current) drug therapy: Secondary | ICD-10-CM | POA: Diagnosis not present

## 2017-06-16 DIAGNOSIS — C7800 Secondary malignant neoplasm of unspecified lung: Secondary | ICD-10-CM

## 2017-06-16 DIAGNOSIS — Z87442 Personal history of urinary calculi: Secondary | ICD-10-CM | POA: Insufficient documentation

## 2017-06-16 DIAGNOSIS — Z955 Presence of coronary angioplasty implant and graft: Secondary | ICD-10-CM | POA: Insufficient documentation

## 2017-06-16 DIAGNOSIS — Z87891 Personal history of nicotine dependence: Secondary | ICD-10-CM | POA: Diagnosis not present

## 2017-06-16 DIAGNOSIS — C7802 Secondary malignant neoplasm of left lung: Secondary | ICD-10-CM | POA: Diagnosis not present

## 2017-06-16 DIAGNOSIS — R918 Other nonspecific abnormal finding of lung field: Secondary | ICD-10-CM | POA: Diagnosis not present

## 2017-06-16 DIAGNOSIS — I2510 Atherosclerotic heart disease of native coronary artery without angina pectoris: Secondary | ICD-10-CM | POA: Diagnosis not present

## 2017-06-16 NOTE — Progress Notes (Signed)
[place] OFFICE PROGRESS NOTE  Patient Care Team: [place] Pink, MD as PCP - General (Family Medicine) Cammie Sickle, MD as Consulting Physician (Oncology) Christene Lye, MD (General Surgery)  Cancer Staging No matching staging information was found for the patient.   Oncology History   # 2007-  Carcinoma of lung adenocarcinoma. T2N0M0 tumor. Status-post right thoracotomy and upper lobectomy. Stage I-B.  #  Diagnosis of sarcoma.  Left thigh (at [facility]    neo adjuvant radiation  and surgery ,  In November of 2011.pathology report shows poorly differentiated sarcoma, high grade.  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 [Dr.Oaks]  # AUG 2017- Recurrent sarcoma- Start RT [sep 2017; finished 10./01/2016]Left Hilar Mass Bx [Dr.kasa]; DEC 2017- PR left hilar mass;   # dec 2017- radiation Pneumonitis- started pre   3. 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 # NOV 2017- Left LE swelling ? Lymphedema [dr.dew]     Malignant neoplasm of upper lobe, left bronchus or lung ([place])      INTERVAL HISTORY:  Stephen [name] 76 y.o.  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   Patient complains of continued cough not improved getting worse. Patient was previously treated with prednisone for his radiation pneumonitis. Was recently evaluated by pulmonary- and started on nebulizer. He notes slight improvement in the cough post nebulizer treatment.he No wheezing. No hemoptysis.  Appetite is good. No weight loss. Denies any headaches.   His symptoms of prostatism- have improved since been started on Flomax.   REVIEW OF SYSTEMS:  A complete 10 point review of system is done  which is negative except mentioned above/history of present illness.   PAST MEDICAL HISTORY :  Past Medical History:  Diagnosis Date  . Cancer [facility]) 2007   Lung CA- Bilateral  . Coronary artery disease   . History of kidney stones   . Lung cancer ([place]) 07/16/2015  . Myocardial infarction [facility]) 2000   coronary stent  . Sarcoma [facility]) 2013   Right groin resection.   . Shortness of breath dyspnea   . Stroke [facility]) 2005    PAST SURGICAL HISTORY :   Past Surgical History:  Procedure Laterality Date  . ABDOMINAL AORTIC ANEURYSM REPAIR  10/28/2012   Dr Lucky Cowboy  . CAROTID ENDARTERECTOMY Right   . COLONOSCOPY  2013  . CYSTOSCOPY WITH LITHOLAPAXY N/A 04/23/2017   Procedure: CYSTOSCOPY WITH LITHOLAPAXY;  Surgeon: Nickie Retort, MD;  Location: [facility] ORS;  Service: Urology;  Laterality: N/A;  . ENDOBRONCHIAL ULTRASOUND N/A 08/08/2016   Procedure: ENDOBRONCHIAL ULTRASOUND;  Surgeon: Vilinda Boehringer, MD;  Location: [facility] ORS;  Service: Cardiopulmonary;  Laterality: N/A;  . EYE SURGERY Bilateral 2010  . HOLMIUM LASER APPLICATION N/A 0/02/5008   Procedure: HOLMIUM LASER APPLICATION;  Surgeon: Nickie Retort, MD;  Location: [facility] ORS;  Service: Urology;  Laterality: N/A;  . SKIN CANCER EXCISION Left   . THORACOTOMY Bilateral    Lobectomy left lower lobe/sarcoma resection.   FAMILY HISTORY :   Family History  Problem Relation Age of Onset  . Hematuria Neg Hx   . Prostate cancer Neg Hx    Family history of cancer/lung  SOCIAL HISTORY:  Currently  not smoking. Social History  Substance Use Topics  . Smoking status: Former Smoker    Quit date: 07/31/2003  . Smokeless tobacco: Never Used  . Alcohol use No    ALLERGIES:  has No Known Allergies.  MEDICATIONS:  Current Outpatient Prescriptions  Medication Sig Dispense Refill  . albuterol (PROVENTIL HFA;VENTOLIN HFA) 108 (90 Base) MCG/ACT inhaler Inhale 2 puffs into the lungs every 6 (six) hours as needed for wheezing or shortness of breath. 1  Inhaler 0  . AMBULATORY NON FORMULARY MEDICATION Medication Name: incentive spirometer 1 each 0  . aspirin EC 81 MG tablet Take 81 mg by mouth daily.    . Cholecalciferol (VITAMIN D) 2000 units tablet Take 2,000 Units by mouth daily.    Marland Kitchen COENZYME Q10 PO Take 1 tablet by mouth daily.    . Doxylamine Succinate, Sleep, (SLEEP AID PO) Take 1 tablet by mouth at bedtime.    Marland Kitchen ipratropium-albuterol (DUONEB) 0.5-2.5 (3) MG/3ML SOLN Use one vial via nebulizer every 6 (six) hours as needed. 360 mL 1  . Omega-3 Fatty Acids (FISH OIL) 1000 MG CAPS Take 1,000 mg by mouth daily.     . tamsulosin (FLOMAX) 0.4 MG CAPS capsule Take 0.4 mg by mouth at bedtime.    . Tiotropium Bromide Monohydrate (SPIRIVA RESPIMAT) 2.5 MCG/ACT AERS Inhale 2.5 mcg into the lungs 2 (two) times daily. 1 Inhaler 12  . vitamin B-12 (CYANOCOBALAMIN) 1000 MCG tablet Take 1,000 mcg by mouth daily.    Marland Kitchen VITAMIN E PO Take 1 capsule by mouth daily.     No current facility-administered medications for this visit.     PHYSICAL EXAMINATION: ECOG PERFORMANCE STATUS: 0 - Asymptomatic  BP 118/73 (BP Location: Left Arm, Patient Position: Sitting)   Pulse 93   Temp 97.6 F (36.4 C) (Tympanic)   Resp 20   Ht 6\' 2"  (1.88 m)   Wt 219 lb (99.3 kg)   BMI 28.12 kg/m   Filed Weights   06/16/17 1348  Weight: 219 lb (99.3 kg)    GENERAL: Well-nourished well-developed; Alert, no distress and comfortable.   Accompanied by his wife. EYES: no pallor or icterus OROPHARYNX: no thrush or ulceration; good dentition  NECK: supple, no masses felt LYMPH:  no palpable lymphadenopathy in the cervical, axillary or inguinal regions LUNGS: decreased breath sounds to auscultation on the left side compared to the right. No wheeze or crackles HEART/CVS: regular rate & rhythm and no murmurs; Bil LE- none.  ABDOMEN:abdomen soft, non-tender and normal bowel sounds Musculoskeletal:no cyanosis of digits and no clubbing  PSYCH: alert & oriented x 3 with fluent  speech NEURO: no focal motor/sensory deficits SKIN:  No rash.  LABORATORY DATA:  I have reviewed the data as listed    Component Value Date/Time   NA 138 02/24/2017 1330   NA 138 01/03/2015 0942   K 4.2 02/24/2017 1330   K 4.8 01/03/2015 0942   CL 104 02/24/2017 1330   CL 100 01/03/2015 0942   CO2 26 02/24/2017 1330   CO2 30 01/03/2015 0942   GLUCOSE 123 (H) 02/24/2017 1330   GLUCOSE 117 (H) 01/03/2015 0942   BUN 18 02/24/2017 1330   BUN 14 01/03/2015 0942   CREATININE 1.17 02/24/2017 1330   CREATININE 1.05 01/03/2015 0942   CALCIUM 9.2 02/24/2017 1330   CALCIUM 9.2 01/03/2015 0942   PROT 6.6 02/24/2017 1330   PROT 7.8 01/03/2015 0942   ALBUMIN 3.6 02/24/2017 1330   ALBUMIN 4.0 01/03/2015 0942  AST 26 02/24/2017 1330   AST 24 01/03/2015 0942   ALT 20 02/24/2017 1330   ALT 34 01/03/2015 0942   ALKPHOS 56 02/24/2017 1330   ALKPHOS 73 01/03/2015 0942   BILITOT 0.6 02/24/2017 1330   BILITOT 0.6 01/03/2015 0942   GFRNONAA 59 (L) 02/24/2017 1330   GFRNONAA >60 01/03/2015 0942   GFRNONAA >60 06/29/2014 0947   GFRAA >60 02/24/2017 1330   GFRAA >60 01/03/2015 0942   GFRAA >60 06/29/2014 0947    No results found for: SPEP, UPEP  Lab Results  Component Value Date   WBC 5.7 02/24/2017   NEUTROABS 4.0 02/24/2017   HGB 15.0 02/24/2017   HCT 44.0 02/24/2017   MCV 85.2 02/24/2017   PLT 237 02/24/2017      Chemistry      Component Value Date/Time   NA 138 02/24/2017 1330   NA 138 01/03/2015 0942   K 4.2 02/24/2017 1330   K 4.8 01/03/2015 0942   CL 104 02/24/2017 1330   CL 100 01/03/2015 0942   CO2 26 02/24/2017 1330   CO2 30 01/03/2015 0942   BUN 18 02/24/2017 1330   BUN 14 01/03/2015 0942   CREATININE 1.17 02/24/2017 1330   CREATININE 1.05 01/03/2015 0942      Component Value Date/Time   CALCIUM 9.2 02/24/2017 1330   CALCIUM 9.2 01/03/2015 0942   ALKPHOS 56 02/24/2017 1330   ALKPHOS 73 01/03/2015 0942   AST 26 02/24/2017 1330   AST 24 01/03/2015 0942    ALT 20 02/24/2017 1330   ALT 34 01/03/2015 0942   BILITOT 0.6 02/24/2017 1330   BILITOT 0.6 01/03/2015 0942       RADIOGRAPHIC STUDIES: I have personally reviewed the radiological images as listed and agreed with the findings in the report. Dg Chest 2 View  Result Date: 06/16/2017 CLINICAL DATA:  Dry cough since October 2017. History of bilateral lung cancer. EXAM: CHEST  2 VIEW COMPARISON:  Chest CT 03/05/2017 FINDINGS: There is complete opacification of the left hemithorax which is new since the prior recent chest CT from March. There is loss of volume in the left hemithorax with hyperinflation of the right lung. Possible density in the left hilum could be an enlarging left hilar massa causing obstruction of the left mainstem bronchus. Recommend followup chest CT with contrast for further evaluation. No right lung lesions. No obvious pleural effusion. Stable left rib changes. IMPRESSION: Complete opacification of the left hemithorax with a bronchial cut off sign involving the left mainstem bronchus likely due to enlarging left hilar mass. Recommend chest CT with contrast for further evaluation. Electronically Signed   By: Marijo Sanes M.D.   On: 06/16/2017 15:37     ASSESSMENT & PLAN:  Malignant neoplasm of upper lobe, left bronchus or lung ([place]) # Recurrent sarcoma of the left hilum- biopsy proven s/p RT [finished Oct 2nd 2017]. MARCH 2018- CT- chest improved left hilar lesion. Given the continued cough- concern for recurrence/progression. Recommend chest x-rays ASAP.   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 Await above chest x-ray.   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 Clinically no evidence of  recurrence noted.   # CXR today; and CT scan in 3 months/labs or sooner based on CXR. Follow up in 3 mnths.  # addendum-  chest x-ray shows left lung white out;  Highly  Suspicious for collapse. Recommend CT scan of the chest with contrast to rule out worsening left hilar lesion. This was discussed the patient's wife over the phone. I reviewed the images of the chest x-ray myself. Patient will follow-up  Few days after the results of the CT scan.    Orders Placed This Encounter  Procedures  . CT CHEST W CONTRAST    Standing Status:   Future    Standing Expiration Date:   08/16/2018    Order Specific Question:   Reason for Exam (SYMPTOM  OR DIAGNOSIS REQUIRED)    Answer:   sarcoma of lung    Order Specific Question:   Preferred imaging location?    Answer:   [facility]  . DG Chest 2 View    Standing Status:   Future    Number of Occurrences:   1    Standing Expiration Date:   08/16/2018    Order Specific Question:   Reason for Exam (SYMPTOM  OR DIAGNOSIS REQUIRED)    Answer:   cough    Order Specific Question:   Preferred imaging location?    Answer:   [facility]  . CT CHEST W CONTRAST    Standing Status:   Future    Standing Expiration Date:   08/16/2018    Order Specific Question:   Reason for Exam (SYMPTOM  OR DIAGNOSIS REQUIRED)    Answer:   sarcoma of lung    Order Specific Question:   Preferred imaging location?    Answer:   [facility]  . Comprehensive metabolic panel    Standing Status:   Future    Standing Expiration Date:   06/16/2018  . CBC with Differential    Standing Status:   Future    Standing Expiration Date:   06/16/2018   All questions were answered. The patient knows to call the clinic with any problems, questions or concerns.      Cammie Sickle, MD 06/16/2017 8:10 PM

## 2017-06-16 NOTE — Assessment & Plan Note (Addendum)
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 MARCH 2018- CT- chest improved left hilar lesion. Given the continued cough- concern for recurrence/progression. Recommend chest x-rays ASAP.   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 Await above chest x-ray.   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 Clinically no evidence of  recurrence noted.   # CXR today; and CT scan in 3 months/labs or sooner based on CXR. Follow up in 3 mnths.  # addendum- chest x-ray shows left lung white out;  Highly  Suspicious for collapse. Recommend CT scan of the chest with contrast to rule out worsening left hilar lesion. This was discussed the patient's wife over the phone. I reviewed the images of the chest x-ray myself. Patient will follow-up  Few days after the results of the CT scan.

## 2017-06-17 ENCOUNTER — Telehealth: Payer: Self-pay | Admitting: Internal Medicine

## 2017-06-17 ENCOUNTER — Ambulatory Visit
Admission: RE | Admit: 2017-06-17 | Discharge: 2017-06-17 | Disposition: A | Discharge status: Home / Self Care | Payer: Medicare Other | Source: Ambulatory Visit | Attending: Internal Medicine | Admitting: Internal Medicine

## 2017-06-17 ENCOUNTER — Other Ambulatory Visit: Payer: Self-pay | Admitting: Internal Medicine

## 2017-06-17 DIAGNOSIS — I2699 Other pulmonary embolism without acute cor pulmonale: Secondary | ICD-10-CM | POA: Diagnosis not present

## 2017-06-17 DIAGNOSIS — I313 Pericardial effusion (noninflammatory): Secondary | ICD-10-CM | POA: Diagnosis not present

## 2017-06-17 DIAGNOSIS — C3412 Malignant neoplasm of upper lobe, left bronchus or lung: Secondary | ICD-10-CM

## 2017-06-17 DIAGNOSIS — I700 Atherosclerosis of aorta: Secondary | ICD-10-CM | POA: Insufficient documentation

## 2017-06-17 DIAGNOSIS — I2510 Atherosclerotic heart disease of native coronary artery without angina pectoris: Secondary | ICD-10-CM | POA: Diagnosis not present

## 2017-06-17 DIAGNOSIS — Z902 Acquired absence of lung [part of]: Secondary | ICD-10-CM | POA: Diagnosis not present

## 2017-06-17 DIAGNOSIS — J9811 Atelectasis: Secondary | ICD-10-CM | POA: Insufficient documentation

## 2017-06-17 LAB — POCT I-STAT CREATININE: Creatinine, Ser: 0.8 mg/dL (ref 0.61–1.24)

## 2017-06-17 MED ORDER — IOPAMIDOL (ISOVUE-300) INJECTION 61%
75.0000 mL | Freq: Once | INTRAVENOUS | Status: AC | PRN
  Administered 2017-06-17: 75 mL via INTRAVENOUS

## 2017-06-17 NOTE — Telephone Encounter (Signed)
I spoke to pt's daughter- Lanelle Bal [patient not at home] regarding the results of the CT scan. We will check with pulmonary for any options. We will review the CT imaging at the tumor conference. Patient to follow up with me on June 28th at 2pm.

## 2017-06-19 ENCOUNTER — Inpatient Hospital Stay (HOSPITAL_BASED_OUTPATIENT_CLINIC_OR_DEPARTMENT_OTHER): Payer: Medicare Other | Admitting: Internal Medicine

## 2017-06-19 VITALS — BP 118/75 | HR 88 | Temp 98.2°F | Resp 20 | Ht 74.0 in | Wt 219.0 lb

## 2017-06-19 DIAGNOSIS — Z79899 Other long term (current) drug therapy: Secondary | ICD-10-CM | POA: Diagnosis not present

## 2017-06-19 DIAGNOSIS — J90 Pleural effusion, not elsewhere classified: Secondary | ICD-10-CM | POA: Diagnosis not present

## 2017-06-19 DIAGNOSIS — Z902 Acquired absence of lung [part of]: Secondary | ICD-10-CM | POA: Diagnosis not present

## 2017-06-19 DIAGNOSIS — C3412 Malignant neoplasm of upper lobe, left bronchus or lung: Secondary | ICD-10-CM

## 2017-06-19 DIAGNOSIS — J700 Acute pulmonary manifestations due to radiation: Secondary | ICD-10-CM | POA: Diagnosis not present

## 2017-06-19 DIAGNOSIS — C7802 Secondary malignant neoplasm of left lung: Secondary | ICD-10-CM | POA: Diagnosis not present

## 2017-06-19 DIAGNOSIS — Z923 Personal history of irradiation: Secondary | ICD-10-CM | POA: Diagnosis not present

## 2017-06-19 NOTE — Assessment & Plan Note (Addendum)
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   # Discussed the serious diagnosis the patient and wife. Surgical resection unlikely. Recommend systemic therapy.   # Systemic therapy options including doxorubicin-Latruvo vs. Gemcitabine-Taxotere. Discussed the potential side effects including but not limited to nausea vomiting diarrhea, hair loss neuropathy; drop in blood counts fatigue. Discussed the potential infusion reactions with Latruvo; and the cardiotoxicity with doxorubicin. We will get a 2-D echo. Also get Mediport placement. Chemotherapy education.   # Will send out Foundation 1 medicine testing; PDL 1 levels. Reviewed at the tumor conference; no pulmonary interventions recommended.  # I have reached out to sarcoma clinic at [facility]. Left a message to speak to sarcoma medical oncologist. Discussed with the patient's wife regarding the above plan. They agree.  # I reviewed the blood work- with the patient in detail; also reviewed the imaging independently [as summarized above]; and with the patient in detail.   # Follow-up with me later next week/labs.   # 40 minutes face-to-face with the patient discussing the above plan of care; more than 50% of time spent on prognosis/ natural history; counseling and coordination.

## 2017-06-19 NOTE — Progress Notes (Signed)
[facility] OFFICE PROGRESS NOTE  Patient Care Team: [place] Pink, MD as PCP - General (Family Medicine) Cammie Sickle, MD as Consulting Physician (Oncology) Christene Lye, MD (General Surgery)  Cancer Staging No matching staging information was found for the patient.   Oncology History   # 2007-  Carcinoma of lung adenocarcinoma. T2N0M0 tumor. Status-post right thoracotomy and upper lobectomy. Stage I-B.  #  Diagnosis of sarcoma.  Left thigh (at [facility], [facility]    neo adjuvant radiation  and surgery ,  In November of 2011.pathology report shows poorly differentiated sarcoma, high grade.  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    # June 2018- Foundation One testing-Pending.  -----------------------------------------------------------------------------------------  # dec 2017- radiation Pneumonitis- started pre  3. 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 # NOV 2017- Left LE swelling ? 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  male pleasant patient above history of High-grade sarcoma metastatic to the lung status post resection- With the left hilar recurrence status post radiation end of July 2017 is here for follow-up To review the results of his CT scan that was ordered for his persistent cough/worsening shortness of breath.  Patient complains of continued cough not improved getting worse. Patient continues  to use a nebulizer with minimal improvement of his cough/shortness of breath. No wheezing. No hemoptysis.  Appetite is good. No weight loss. Denies any headaches.   REVIEW OF SYSTEMS:  A complete 10 point review of system is done which is negative except mentioned above/history of present illness.   PAST MEDICAL HISTORY :  Past Medical History:  Diagnosis Date  . Cancer [facility]) 2007   Lung CA- Bilateral  . Coronary artery disease   . History of kidney stones   . Lung cancer ([place]) 07/16/2015  . Myocardial infarction [facility]) 2000   coronary stent  . Sarcoma [facility]) 2013   Right groin resection.   . Shortness of breath dyspnea   . Stroke [facility]) 2005    PAST SURGICAL HISTORY :   Past Surgical History:  Procedure Laterality Date  . ABDOMINAL AORTIC ANEURYSM REPAIR  10/28/2012   Dr Lucky Cowboy  . CAROTID ENDARTERECTOMY Right   . COLONOSCOPY  2013  . CYSTOSCOPY WITH LITHOLAPAXY N/A 04/23/2017   Procedure: CYSTOSCOPY WITH LITHOLAPAXY;  Surgeon: Nickie Retort, MD;  Location: [facility] ORS;  Service: Urology;  Laterality: N/A;  . ENDOBRONCHIAL ULTRASOUND N/A 08/08/2016   Procedure: ENDOBRONCHIAL ULTRASOUND;  Surgeon: Vilinda Boehringer, MD;  Location: [facility] ORS;  Service: Cardiopulmonary;  Laterality: N/A;  . EYE SURGERY Bilateral 2010  . HOLMIUM LASER APPLICATION N/A 02/24/7424   Procedure: HOLMIUM LASER APPLICATION;  Surgeon: Nickie Retort, MD;  Location: [facility] ORS;  Service: Urology;  Laterality: N/A;  . SKIN CANCER EXCISION Left   . THORACOTOMY Bilateral    Lobectomy left lower lobe/sarcoma resection.   FAMILY HISTORY :  Family History  Problem Relation Age of Onset  . Hematuria Neg Hx   . Prostate cancer Neg Hx    Family history of cancer/lung  SOCIAL HISTORY:  Currently not smoking. Social History  Substance Use Topics  . Smoking status: Former Smoker    Quit date: 07/31/2003  . Smokeless tobacco: Never Used  . Alcohol use No    ALLERGIES:  has No Known Allergies.  MEDICATIONS:  Current  Outpatient Prescriptions  Medication Sig Dispense Refill  . albuterol (PROVENTIL HFA;VENTOLIN HFA) 108 (90 Base) MCG/ACT inhaler Inhale 2 puffs into the lungs every 6 (six) hours as needed for wheezing or shortness of breath. 1 Inhaler 0  . aspirin EC 81 MG tablet Take 81 mg by mouth daily.    . Cholecalciferol (VITAMIN D) 2000 units tablet Take 2,000 Units by mouth daily.    Marland Kitchen COENZYME Q10 PO Take 1 tablet by mouth daily.    . Doxylamine Succinate, Sleep, (SLEEP AID PO) Take 1 tablet by mouth at bedtime.    Marland Kitchen ipratropium-albuterol (DUONEB) 0.5-2.5 (3) MG/3ML SOLN Use one vial via nebulizer every 6 (six) hours as needed. 360 mL 1  . Omega-3 Fatty Acids (FISH OIL) 1000 MG CAPS Take 1,000 mg by mouth daily.     . tamsulosin (FLOMAX) 0.4 MG CAPS capsule Take 0.4 mg by mouth at bedtime.    . Tiotropium Bromide Monohydrate (SPIRIVA RESPIMAT) 2.5 MCG/ACT AERS Inhale 2.5 mcg into the lungs 2 (two) times daily. 1 Inhaler 12  . vitamin B-12 (CYANOCOBALAMIN) 1000 MCG tablet Take 1,000 mcg by mouth daily.    Marland Kitchen VITAMIN E PO Take 1 capsule by mouth daily.    . AMBULATORY NON FORMULARY MEDICATION Medication Name: incentive spirometer (Patient not taking: Reported on 06/19/2017) 1 each 0   No current facility-administered medications for this visit.     PHYSICAL EXAMINATION: ECOG PERFORMANCE STATUS: 0 - Asymptomatic  BP 118/75 (BP Location: Left Arm, Patient Position: Sitting)   Pulse 88   Temp 98.2 F (36.8 C) (Tympanic)   Resp 20   Ht 6\' 2"  (1.88 m)   Wt 219 lb (99.3 kg)   SpO2 91% Comment: RA  BMI 28.12 kg/m   Filed Weights   06/19/17 1335  Weight: 219 lb (99.3 kg)    GENERAL: Well-nourished well-developed; Alert, no distress and comfortable.   Accompanied by his wife. EYES: no pallor or icterus OROPHARYNX: no thrush or ulceration; good dentition  NECK: supple, no masses felt LYMPH:  no palpable lymphadenopathy in the cervical, axillary or inguinal regions LUNGS: decreased breath sounds  to auscultation on the left side compared to the right. No wheeze or crackles HEART/CVS: regular rate & rhythm and no murmurs; Bil LE- none.  ABDOMEN:abdomen soft, non-tender and normal bowel sounds Musculoskeletal:no cyanosis of digits and no clubbing  PSYCH: alert & oriented x 3 with fluent speech NEURO: no focal motor/sensory deficits SKIN:  No rash.  LABORATORY DATA:  I have reviewed the data as listed    Component Value Date/Time   NA 138 02/24/2017 1330   NA 138 01/03/2015 0942   K 4.2 02/24/2017 1330   K 4.8 01/03/2015 0942   CL 104 02/24/2017 1330   CL 100 01/03/2015 0942   CO2 26 02/24/2017 1330   CO2 30 01/03/2015 0942   GLUCOSE 123 (H) 02/24/2017 1330   GLUCOSE 117 (H) 01/03/2015 0942   BUN 18 02/24/2017 1330   BUN 14 01/03/2015 0942   CREATININE 0.80 06/17/2017 1616  CREATININE 1.05 01/03/2015 0942   CALCIUM 9.2 02/24/2017 1330   CALCIUM 9.2 01/03/2015 0942   PROT 6.6 02/24/2017 1330   PROT 7.8 01/03/2015 0942   ALBUMIN 3.6 02/24/2017 1330   ALBUMIN 4.0 01/03/2015 0942   AST 26 02/24/2017 1330   AST 24 01/03/2015 0942   ALT 20 02/24/2017 1330   ALT 34 01/03/2015 0942   ALKPHOS 56 02/24/2017 1330   ALKPHOS 73 01/03/2015 0942   BILITOT 0.6 02/24/2017 1330   BILITOT 0.6 01/03/2015 0942   GFRNONAA 59 (L) 02/24/2017 1330   GFRNONAA >60 01/03/2015 0942   GFRNONAA >60 06/29/2014 0947   GFRAA >60 02/24/2017 1330   GFRAA >60 01/03/2015 0942   GFRAA >60 06/29/2014 0947    No results found for: SPEP, UPEP  Lab Results  Component Value Date   WBC 5.7 02/24/2017   NEUTROABS 4.0 02/24/2017   HGB 15.0 02/24/2017   HCT 44.0 02/24/2017   MCV 85.2 02/24/2017   PLT 237 02/24/2017      Chemistry      Component Value Date/Time   NA 138 02/24/2017 1330   NA 138 01/03/2015 0942   K 4.2 02/24/2017 1330   K 4.8 01/03/2015 0942   CL 104 02/24/2017 1330   CL 100 01/03/2015 0942   CO2 26 02/24/2017 1330   CO2 30 01/03/2015 0942   BUN 18 02/24/2017 1330   BUN  14 01/03/2015 0942   CREATININE 0.80 06/17/2017 1616   CREATININE 1.05 01/03/2015 0942      Component Value Date/Time   CALCIUM 9.2 02/24/2017 1330   CALCIUM 9.2 01/03/2015 0942   ALKPHOS 56 02/24/2017 1330   ALKPHOS 73 01/03/2015 0942   AST 26 02/24/2017 1330   AST 24 01/03/2015 0942   ALT 20 02/24/2017 1330   ALT 34 01/03/2015 0942   BILITOT 0.6 02/24/2017 1330   BILITOT 0.6 01/03/2015 0942       RADIOGRAPHIC STUDIES: I have personally reviewed the radiological images as listed and agreed with the findings in the report. Ct Chest W Contrast  Result Date: 06/17/2017 CLINICAL DATA:  Left hilar sarcoma (metastatic from the left thigh). Cough. 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 Prior left lower lobectomy and wedge resection in the left upper lobe. Prior right upper lobectomy. EXAM: CT CHEST WITH CONTRAST TECHNIQUE: Multidetector CT imaging of the chest was performed during intravenous contrast administration. CONTRAST:  31mL ISOVUE-300 IOPAMIDOL (ISOVUE-300) INJECTION 61% COMPARISON:  Multiple exams, including 03/05/2017 CT scan FINDINGS: Cardiovascular: The mass previously shown just below the left pulmonary artery appears to of invaded up into the pulmonary artery, causing extensive tumor thrombus in the left pulmonary artery as shown on image 104/5. The left pulmonary artery is not entirely occluded but is significantly narrowed. Coronary, aortic arch, and branch vessel atherosclerotic vascular disease. Leftward shift of cardiac and mediastinal structures. Mediastinum/Nodes: The left infrahilar mass is of similar density to the completely atelectatic surrounding left upper lobe and accordingly is difficult to measure, but is subjectively a believed to be increased in size compared to prior, and has now invaded the left pulmonary artery as described above. Moreover, there are low-density components of the peripheral left hemithorax which may be from loculated pleural  fluid or pleural tumor, for example measuring up to 2 cm in thickness along the left upper thorax on image 49/2 posterolaterally. Small scattered mediastinal lymph nodes are increased in number compared to the prior exam. An index lymph node adjacent to the  upper esophagus measures 8 mm in short axis on image 29/2, previously 3 mm. Small left eccentric pericardial effusion. Pericardial invasion by tumor not excluded. Lungs/Pleura: Complete atelectasis of the left hemithorax attributed to occlusion of the left upper lobe bronchus and accordingly complete atelectasis of the left upper lobe. Ill-defined tumor along the infrahilar region, difficult to measure but subjectively increased from prior. Mild airway thickening in the right lung. Prior right upper lobectomy. Stable sub solid 6 by 5 mm right middle lobe pulmonary nodule on image 84/3. Upper Abdomen: Unremarkable Musculoskeletal: Old healed rib deformities.  Thoracic spondylosis. IMPRESSION: 1. Enlarging left infrahilar mass invades and severely narrows although does not totally occlude the left pulmonary artery. There is associated tumor thrombus in the left upper lobe pulmonary artery. The patient has had prior left lower lobectomy in the left upper lobe is completely collapsed with complete atelectasis of the remaining left lung parenchyma. There also loculated low-density pleural lesions on the left probably from loculated if lesions, less likely from pleural tumor. 2. I do not see any filling defects in the right pulmonary arterial tree, although today's exam was performed as a general diagnostic CT and not as a dedicated pulmonary embolus CT angiography study, and accordingly may have some reduced sensitivity for smaller pulmonary emboli. 3. Left eccentric pericardial effusion, I cannot exclude early invasion of tumor into the pericardium. 4. Mediastinal lymph nodes have slightly enlarged and are increased in number, in these could be inflammatory or  neoplastic. 5. Subsolid 5 by 6 mm right middle lobe pulmonary nodule is stable and merits surveillance. 6.  Aortic Atherosclerosis (ICD10-I70.0).  Coronary atherosclerosis. Critical Value/emergent results were called by telephone at the time of interpretation on 06/17/2017 at 4:54 pm to Dr. Charlaine Dalton , who verbally acknowledged these results. Dr. Rogue Bussing instructed me to release the patient, and plans to contact the patient himself. Electronically Signed   By: Van Clines M.D.   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   # Discussed the serious diagnosis the patient and wife. Surgical resection unlikely. Recommend systemic therapy.   # Systemic therapy options including doxorubicin-Latruvo vs. Gemcitabine-Taxotere. Discussed the potential side effects including but not limited to nausea vomiting diarrhea, hair loss neuropathy; drop in blood counts fatigue. Discussed the potential infusion reactions with Latruvo; and the cardiotoxicity with doxorubicin. We will get a 2-D echo. Also get Mediport placement. Chemotherapy education.   # Will send out Foundation 1 medicine testing; PDL 1 levels. Reviewed at the tumor conference; no pulmonary interventions recommended.  # I have reached out to sarcoma clinic at [facility]. Left a message to speak to sarcoma medical oncologist. Discussed with the patient's wife regarding the above plan. They agree.  # I reviewed the blood work- with the patient in detail; also reviewed the imaging independently [as summarized above]; and with the patient in detail.   # Follow-up with me later next week/labs.   # 40 minutes face-to-face with the patient discussing the above plan of care;  more than 50% of time spent on prognosis/ natural history; counseling and coordination.   Orders Placed This Encounter  Procedures  . CBC with Differential    Standing Status:   Future    Standing Expiration Date:  06/19/2018  . Comprehensive metabolic panel    Standing Status:   Future    Standing Expiration Date:   06/19/2018  . Ambulatory referral to Vascular Surgery    Referral Priority:   Urgent    Referral Type:   Surgical    Referral Reason:   Specialty Services Required    Referred to Provider:   Algernon Huxley, MD    Requested Specialty:   Vascular Surgery    Number of Visits Requested:   1  . ECHOCARDIOGRAM LIMITED    Standing Status:   Future    Standing Expiration Date:   09/19/2018    Order Specific Question:   Where should this test be performed    Answer:   [facility]    Order Specific Question:   Perflutren DEFINITY (image enhancing agent) should be administered unless hypersensitivity or allergy exist    Answer:   Do NOT administer Perflutren    Order Specific Question:   Reason for no Perflutren    Answer:   Other    Order Specific Question:   Specify other    Answer:   tumor thrombus in the left upper lobe pulmonary artery    Order Specific Question:   Expected Date:    Answer:   ASAP   All questions were answered. The patient knows to call the clinic with any problems, questions or concerns.      Cammie Sickle, MD 06/19/2017 4:10 PM

## 2017-06-20 ENCOUNTER — Other Ambulatory Visit (INDEPENDENT_AMBULATORY_CARE_PROVIDER_SITE_OTHER): Payer: Self-pay

## 2017-06-20 MED ORDER — DEXTROSE 5 % IV SOLN: 1.0000 g | Freq: Once | INTRAVENOUS | Status: AC

## 2017-06-23 ENCOUNTER — Encounter
Admission: RE | Disposition: A | Discharge status: Home / Self Care | Payer: Self-pay | Source: Ambulatory Visit | Attending: Vascular Surgery

## 2017-06-23 ENCOUNTER — Other Ambulatory Visit: Payer: Self-pay | Admitting: Internal Medicine

## 2017-06-23 ENCOUNTER — Encounter: Payer: Self-pay | Admitting: Vascular Surgery

## 2017-06-23 ENCOUNTER — Ambulatory Visit
Admission: RE | Admit: 2017-06-23 | Discharge: 2017-06-23 | Disposition: A | Discharge status: Home / Self Care | Payer: Medicare Other | Source: Ambulatory Visit | Attending: Vascular Surgery | Admitting: Vascular Surgery

## 2017-06-23 DIAGNOSIS — Z87891 Personal history of nicotine dependence: Secondary | ICD-10-CM | POA: Insufficient documentation

## 2017-06-23 DIAGNOSIS — Z9889 Other specified postprocedural states: Secondary | ICD-10-CM | POA: Diagnosis not present

## 2017-06-23 DIAGNOSIS — Z902 Acquired absence of lung [part of]: Secondary | ICD-10-CM | POA: Diagnosis not present

## 2017-06-23 DIAGNOSIS — Z955 Presence of coronary angioplasty implant and graft: Secondary | ICD-10-CM | POA: Insufficient documentation

## 2017-06-23 DIAGNOSIS — I2510 Atherosclerotic heart disease of native coronary artery without angina pectoris: Secondary | ICD-10-CM | POA: Diagnosis not present

## 2017-06-23 DIAGNOSIS — C7800 Secondary malignant neoplasm of unspecified lung: Secondary | ICD-10-CM | POA: Insufficient documentation

## 2017-06-23 DIAGNOSIS — Z8673 Personal history of transient ischemic attack (TIA), and cerebral infarction without residual deficits: Secondary | ICD-10-CM | POA: Insufficient documentation

## 2017-06-23 DIAGNOSIS — R0602 Shortness of breath: Secondary | ICD-10-CM | POA: Insufficient documentation

## 2017-06-23 DIAGNOSIS — C3412 Malignant neoplasm of upper lobe, left bronchus or lung: Secondary | ICD-10-CM | POA: Diagnosis not present

## 2017-06-23 DIAGNOSIS — I252 Old myocardial infarction: Secondary | ICD-10-CM | POA: Diagnosis not present

## 2017-06-23 DIAGNOSIS — Z7982 Long term (current) use of aspirin: Secondary | ICD-10-CM | POA: Diagnosis not present

## 2017-06-23 DIAGNOSIS — Z87442 Personal history of urinary calculi: Secondary | ICD-10-CM | POA: Diagnosis not present

## 2017-06-23 HISTORY — PX: PORTA CATH INSERTION: CATH118285

## 2017-06-23 SURGERY — PORTA CATH INSERTION
Anesthesia: Moderate Sedation

## 2017-06-23 MED ORDER — LIDOCAINE-EPINEPHRINE (PF) 2 %-1:200000 IJ SOLN
INTRAMUSCULAR | Status: AC
  Filled 2017-06-23: qty 20

## 2017-06-23 MED ORDER — MIDAZOLAM HCL 5 MG/5ML IJ SOLN
INTRAMUSCULAR | Status: AC
  Filled 2017-06-23: qty 5

## 2017-06-23 MED ORDER — HEPARIN (PORCINE) IN NACL 2-0.9 UNIT/ML-% IJ SOLN
INTRAMUSCULAR | Status: AC
  Filled 2017-06-23: qty 500

## 2017-06-23 MED ORDER — MIDAZOLAM HCL 2 MG/2ML IJ SOLN
INTRAMUSCULAR | Status: DC | PRN
  Administered 2017-06-23: 2 mg via INTRAVENOUS
  Administered 2017-06-23: 1 mg via INTRAVENOUS
  Administered 2017-06-23: 2 mg via INTRAVENOUS

## 2017-06-23 MED ORDER — FENTANYL CITRATE (PF) 100 MCG/2ML IJ SOLN
INTRAMUSCULAR | Status: DC | PRN
  Administered 2017-06-23 (×2): 50 ug via INTRAVENOUS

## 2017-06-23 MED ORDER — SODIUM CHLORIDE 0.9 % IV SOLN: INTRAVENOUS | Status: DC

## 2017-06-23 MED ORDER — SODIUM CHLORIDE 0.9 % IR SOLN
Freq: Once | Status: DC
  Filled 2017-06-23 (×3): qty 2

## 2017-06-23 MED ORDER — FENTANYL CITRATE (PF) 100 MCG/2ML IJ SOLN
INTRAMUSCULAR | Status: AC
  Filled 2017-06-23: qty 2

## 2017-06-23 MED ORDER — CEFAZOLIN SODIUM-DEXTROSE 2-4 GM/100ML-% IV SOLN
2.0000 g | Freq: Once | INTRAVENOUS | Status: AC
  Administered 2017-06-23: 2 g via INTRAVENOUS

## 2017-06-23 SURGICAL SUPPLY — 9 items
COVER PROBE U/S 5X48 (MISCELLANEOUS) ×3 IMPLANT
KIT PORT POWER 8FR ISP CVUE (Catheter) ×3 IMPLANT
PACK ANGIOGRAPHY (CUSTOM PROCEDURE TRAY) ×3 IMPLANT
PAD GROUND ADULT SPLIT (MISCELLANEOUS) ×3 IMPLANT
PENCIL ELECTRO HAND CTR (MISCELLANEOUS) ×3 IMPLANT
SPONGE XRAY 4X4 16PLY STRL (MISCELLANEOUS) ×3 IMPLANT
SUT MNCRL AB 4-0 PS2 18 (SUTURE) ×3 IMPLANT
SUT PROLENE 0 CT 1 30 (SUTURE) ×3 IMPLANT
SUTURE VIC 3-0 (SUTURE) ×3 IMPLANT

## 2017-06-23 NOTE — Progress Notes (Signed)
Patient here today for port cath placement per DR Dew. Placed to monitor and will monitor vitals as well as etco2 during and throughout entire procedure.

## 2017-06-23 NOTE — H&P (Signed)
[facility] History & Physical Update  The patient was interviewed and re-examined.  The patient's previous History and Physical has been reviewed and is unchanged.  There is no change in the plan of care. We plan to proceed with the scheduled procedure.  Leotis Pain, MD  06/23/2017, 9:24 AM

## 2017-06-23 NOTE — Discharge Instructions (Signed)
Moderate Conscious Sedation, Adult, Care After These instructions provide you with information about caring for yourself after your procedure. Your health care provider may also give you more specific instructions. Your treatment has been planned according to current medical practices, but problems sometimes occur. Call your health care provider if you have any problems or questions after your procedure. What can I expect after the procedure? After your procedure, it is common:  To feel sleepy for several hours.  To feel clumsy and have poor balance for several hours.  To have poor judgment for several hours.  To vomit if you eat too soon.  Follow these instructions at home: For at least 24 hours after the procedure:   Do not: ? Participate in activities where you could fall or become injured. ? Drive. ? Use heavy machinery. ? Drink alcohol. ? Take sleeping pills or medicines that cause drowsiness. ? Make important decisions or sign legal documents. ? Take care of children on your own.  Rest. Eating and drinking  Follow the diet recommended by your health care provider.  If you vomit: ? Drink water, juice, or soup when you can drink without vomiting. ? Make sure you have little or no nausea before eating solid foods. General instructions  Have a responsible adult stay with you until you are awake and alert.  Take over-the-counter and prescription medicines only as told by your health care provider.  If you smoke, do not smoke without supervision.  Keep all follow-up visits as told by your health care provider. This is important. Contact a health care provider if:  You keep feeling nauseous or you keep vomiting.  You feel light-headed.  You develop a rash.  You have a fever. Get help right away if:  You have trouble breathing. This information is not intended to replace advice given to you by your health care provider. Make sure you discuss any questions you have  with your health care provider. Document Released: 09/29/2013 Document Revised: 05/13/2016 Document Reviewed: 03/30/2016 Elsevier Interactive Patient Education  2018 [place] Catheter Insertion, Care After Refer to this sheet in the next few weeks. These instructions provide you with information about caring for yourself after your procedure. Your health care provider may also give you more specific instructions. Your treatment has been planned according to current medical practices, but problems sometimes occur. Call your health care provider if you have any problems or questions after your procedure. What can I expect after the procedure? After the procedure, it is common to have:  Some mild redness, swelling, and pain around your catheter site.  A small amount of blood or clear fluid coming from your incisions.  Follow these instructions at home: Incision care  Check your incision areas every day for signs of infection. Check for: ? More redness, swelling, or pain. ? More fluid or blood. ? Warmth. ? Pus or a bad smell.  Follow instructions from your health care provider about how to take care of your incisions. Make sure you: ? Wash your hands with soap and water before you change your bandages (dressings). If soap and water are not available, use hand sanitizer. ? Change your dressings as told by your health care provider. Wash the area around your incisions with a germ-killing (antiseptic) solution when you change your dressing, as told by your health care provider. ? Leave stitches (sutures), skin glue, or adhesive strips in place. These skin closures may need to stay in place for 2 weeks or  longer. If adhesive strip edges start to loosen and curl up, you may trim the loose edges. Do not remove adhesive strips completely unless your health care provider tells you to do that. Catheter Care   Wash your hands with soap and water before and after caring for your catheter.  If soap and water are not available, use hand sanitizer.  Keep your catheter site and your dressings clean and dry.  Apply an antibiotic ointment to your catheter site as told by your health care provider.  Flush your catheter as told by your health care provider. This helps prevent it from becoming clogged.  Do not open the caps on the ends of the catheter.  Do not pull on your catheter.  If your catheter is in your arm: ? Avoid wearing tight clothes or tight jewelry on your arm that has the catheter. ? Do not sleep with your head on the arm that has the catheter. ? Do not allow your blood pressure to be taken on the arm that has the catheter. ? Do not allow your blood to be drawn from the arm that has the catheter, except through the catheter itself. Medicines  Take over-the-counter and prescription medicines only as told by your health care provider.  If you were prescribed an antibiotic medicine, take it as told by your health care provider. Do not stop taking the antibiotic even if you start to feel better. Activity  Return to your normal activities as told by your health care provider. Ask your health care provider what activities are safe for you.  Do not lift anything that is heavier than 10 lb (4.5 kg) for 3 weeks or as long as told by your health care provider. Driving  Do not drive until your health care provider approves.  Do not drive or operate heavy machinery while taking prescription pain medicine. General instructions  Follow your health care provider's specific instructions for the type of catheter that you have.  Do not take baths, swim, or use a hot tub until your health care provider approves.  Follow instructions from your health care provider about eating or drinking restrictions.  Wear compression stockings as told by your health care provider. These stockings help to prevent blood clots and reduce swelling in your legs.  Keep all follow-up visits as  told by your health care provider. This is important. Contact a health care provider if:  You have more fluid or blood coming from your incisions.  You have more redness, swelling, or pain at your incisions or around the area where your catheter is inserted.  Your incisions feel warm to the touch.  You feel unusually weak.  You feel nauseous.  Your catheter is not working properly.  You have blood or fluid draining from your catheter.  You are unable to flush your catheter. Get help right away if:  Your catheter breaks.  A hole develops in your catheter.  Your catheter comes loose or gets pulled completely out. If this happens, press on your catheter site firmly with your hand or a clean cloth until you get medical help.  Your catheter becomes blocked.  You have swelling in your arm, shoulder, neck, or face.  You develop chest pain.  You have difficulty breathing.  You feel dizzy or light-headed.  You have pus or a bad smell coming from your incisions.  You have a fever.  You develop bleeding from your catheter or your insertion site, and your bleeding does not  stop. This information is not intended to replace advice given to you by your health care provider. Make sure you discuss any questions you have with your health care provider. Document Released: 11/25/2012 Document Revised: 08/11/2016 Document Reviewed: 09/04/2015 Elsevier Interactive Patient Education  [employer].

## 2017-06-23 NOTE — Progress Notes (Signed)
START OFF PATHWAY REGIMEN - [Other Dx]   [id number]:Docetaxel + Gemcitabine (100/900) q21d:   A cycle is every 21 days:     Gemcitabine      Docetaxel   **Always confirm dose/schedule in your pharmacy ordering system**    Patient Characteristics: Intent of Therapy: Non-Curative / Palliative Intent, Discussed with Patient

## 2017-06-23 NOTE — Progress Notes (Signed)
Patient on plan of care prior to pathways. 

## 2017-06-23 NOTE — Progress Notes (Signed)
Procedure done, pt tolerated well with vitals stable throughout,pt awake, will transfer to specials recovery to finish recovery prior to discharge.

## 2017-06-23 NOTE — Op Note (Signed)
      [facility]       Operative Note  Date: 06/23/2017  Preoperative diagnosis:  1. 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 Ultrasound guidance for vascular access to the right internal jugular vein. #2. Fluoroscopic guidance for placement of catheter. #3. Placement of CT compatible Port-A-Cath, right internal jugular vein.  Surgeon: Leotis Pain, MD.   Anesthesia: Local with moderate conscious sedation for approximately 25  minutes using 5 mg of Versed and 100 mcg of Fentanyl  Fluoroscopy time: less than 1 minute  Contrast used: 0  Estimated blood loss: minimal  Indication for the procedure:  The patient is a 76 y.o.male with metastatic sarcoma to the lung.  The patient needs a Port-A-Cath for durable venous access, chemotherapy, lab draws, and CT scans. We are asked to place this. Risks and benefits were discussed and informed consent was obtained.  Description of procedure: The patient was brought to the vascular and interventional radiology suite.  Moderate conscious sedation was administered throughout the procedure during a face to face encounter with the patient with my supervision of the RN administering medicines and monitoring the patient's vital signs, pulse oximetry, telemetry and mental status throughout from the start of the procedure until the patient was taken to the recovery room. The right neck chest and shoulder were sterilely prepped and draped, and a sterile surgical field was created. Ultrasound was used to help visualize a patent right internal jugular vein. This was then accessed under direct ultrasound guidance without difficulty with the Seldinger needle and a permanent image was recorded. A J-wire was placed. After skin nick and dilatation, the peel-away sheath was then placed over the wire. I then anesthetized an area under the clavicle approximately 1-2  fingerbreadths. A transverse incision was created and an inferior pocket was created with electrocautery and blunt dissection. The port was then brought onto the field, placed into the pocket and secured to the chest wall with 2 Prolene sutures. The catheter was connected to the port and tunneled from the subclavicular incision to the access site. Fluoroscopic guidance was then used to cut the catheter to an appropriate length. The catheter was then placed through the peel-away sheath and the peel-away sheath was removed. The catheter tip was parked in excellent location under fluorocoscopic guidance in the mid SVC. The pocket was then irrigated with antibiotic impregnated saline and the wound was closed with a running 3-0 Vicryl and a 4-0 Monocryl. The access incision was closed with a single 4-0 Monocryl. The Huber needle was used to withdraw blood and flush the port with heparinized saline. Dermabond was then placed as a dressing. The patient tolerated the procedure well and was taken to the recovery room in stable condition.   Leotis Pain 06/23/2017 10:15 AM   This note was created with Dragon Medical transcription system. Any errors in dictation are purely unintentional.

## 2017-06-24 NOTE — Patient Instructions (Signed)
Docetaxel injection What is this medicine? DOCETAXEL (doe se TAX el) is a chemotherapy drug. It targets fast dividing cells, like cancer cells, and causes these cells to die. This medicine is used to treat many types of cancers like breast cancer, certain stomach cancers, head and neck cancer, lung cancer, and prostate cancer. This medicine may be used for other purposes; ask your health care provider or pharmacist if you have questions. COMMON BRAND NAME(S): Docefrez, Taxotere What should I tell my health care provider before I take this medicine? They need to know if you have any of these conditions: -infection (especially a virus infection such as chickenpox, cold sores, or herpes) -liver disease -low blood counts, like low white cell, platelet, or red cell counts -an unusual or allergic reaction to docetaxel, polysorbate 80, other chemotherapy agents, other medicines, foods, dyes, or preservatives -pregnant or trying to get pregnant -breast-feeding How should I use this medicine? This drug is given as an infusion into a vein. It is administered in a hospital or clinic by a specially trained health care professional. Talk to your pediatrician regarding the use of this medicine in children. Special care may be needed. Overdosage: If you think you have taken too much of this medicine contact a poison control center or emergency room at once. NOTE: This medicine is only for you. Do not share this medicine with others. What if I miss a dose? It is important not to miss your dose. Call your doctor or health care professional if you are unable to keep an appointment. What may interact with this medicine? -cyclosporine -erythromycin -ketoconazole -medicines to increase blood counts like filgrastim, pegfilgrastim, sargramostim -vaccines Talk to your doctor or health care professional before taking any of these medicines: -acetaminophen -aspirin -ibuprofen -ketoprofen -naproxen This list  may not describe all possible interactions. Give your health care provider a list of all the medicines, herbs, non-prescription drugs, or dietary supplements you use. Also tell them if you smoke, drink alcohol, or use illegal drugs. Some items may interact with your medicine. What should I watch for while using this medicine? Your condition will be monitored carefully while you are receiving this medicine. You will need important blood work done while you are taking this medicine. This drug may make you feel generally unwell. This is not uncommon, as chemotherapy can affect healthy cells as well as cancer cells. Report any side effects. Continue your course of treatment even though you feel ill unless your doctor tells you to stop. In some cases, you may be given additional medicines to help with side effects. Follow all directions for their use. Call your doctor or health care professional for advice if you get a fever, chills or sore throat, or other symptoms of a cold or flu. Do not treat yourself. This drug decreases your body's ability to fight infections. Try to avoid being around people who are sick. This medicine may increase your risk to bruise or bleed. Call your doctor or health care professional if you notice any unusual bleeding. This medicine may contain alcohol in the product. You may get drowsy or dizzy. Do not drive, use machinery, or do anything that needs mental alertness until you know how this medicine affects you. Do not stand or sit up quickly, especially if you are an older patient. This reduces the risk of dizzy or fainting spells. Avoid alcoholic drinks. Do not become pregnant while taking this medicine. Women should inform their doctor if they wish to become pregnant or   think they might be pregnant. There is a potential for serious side effects to an unborn child. Talk to your health care professional or pharmacist for more information. Do not breast-feed an infant while taking  this medicine. What side effects may I notice from receiving this medicine? Side effects that you should report to your doctor or health care professional as soon as possible: -allergic reactions like skin rash, itching or hives, swelling of the face, lips, or tongue -low blood counts - This drug may decrease the number of white blood cells, red blood cells and platelets. You may be at increased risk for infections and bleeding. -signs of infection - fever or chills, cough, sore throat, pain or difficulty passing urine -signs of decreased platelets or bleeding - bruising, pinpoint red spots on the skin, black, tarry stools, nosebleeds -signs of decreased red blood cells - unusually weak or tired, fainting spells, lightheadedness -breathing problems -fast or irregular heartbeat -low blood pressure -mouth sores -nausea and vomiting -pain, swelling, redness or irritation at the injection site -pain, tingling, numbness in the hands or feet -swelling of the ankle, feet, hands -weight gain Side effects that usually do not require medical attention (report to your doctor or health care professional if they continue or are bothersome): -bone pain -complete hair loss including hair on your head, underarms, pubic hair, eyebrows, and eyelashes -diarrhea -excessive tearing -changes in the color of fingernails -loosening of the fingernails -nausea -muscle pain -red flush to skin -sweating -weak or tired This list may not describe all possible side effects. Call your doctor for medical advice about side effects. You may report side effects to FDA at 1-800-FDA-1088. Where should I keep my medicine? This drug is given in a hospital or clinic and will not be stored at home. NOTE: This sheet is a summary. It may not cover all possible information. If you have questions about this medicine, talk to your doctor, pharmacist, or health care provider.  2018 Elsevier/Gold Standard (2016-01-11  12:32:56) Gemcitabine injection What is this medicine? GEMCITABINE (jem SIT a been) is a chemotherapy drug. This medicine is used to treat many types of cancer like breast cancer, lung cancer, pancreatic cancer, and ovarian cancer. This medicine may be used for other purposes; ask your health care provider or pharmacist if you have questions. COMMON BRAND NAME(S): Gemzar What should I tell my health care provider before I take this medicine? They need to know if you have any of these conditions: -blood disorders -infection -kidney disease -liver disease -recent or ongoing radiation therapy -an unusual or allergic reaction to gemcitabine, other chemotherapy, other medicines, foods, dyes, or preservatives -pregnant or trying to get pregnant -breast-feeding How should I use this medicine? This drug is given as an infusion into a vein. It is administered in a hospital or clinic by a specially trained health care professional. Talk to your pediatrician regarding the use of this medicine in children. Special care may be needed. Overdosage: If you think you have taken too much of this medicine contact a poison control center or emergency room at once. NOTE: This medicine is only for you. Do not share this medicine with others. What if I miss a dose? It is important not to miss your dose. Call your doctor or health care professional if you are unable to keep an appointment. What may interact with this medicine? -medicines to increase blood counts like filgrastim, pegfilgrastim, sargramostim -some other chemotherapy drugs like cisplatin -vaccines Talk to your doctor or health  care professional before taking any of these medicines: -acetaminophen -aspirin -ibuprofen -ketoprofen -naproxen This list may not describe all possible interactions. Give your health care provider a list of all the medicines, herbs, non-prescription drugs, or dietary supplements you use. Also tell them if you smoke,  drink alcohol, or use illegal drugs. Some items may interact with your medicine. What should I watch for while using this medicine? Visit your doctor for checks on your progress. This drug may make you feel generally unwell. This is not uncommon, as chemotherapy can affect healthy cells as well as cancer cells. Report any side effects. Continue your course of treatment even though you feel ill unless your doctor tells you to stop. In some cases, you may be given additional medicines to help with side effects. Follow all directions for their use. Call your doctor or health care professional for advice if you get a fever, chills or sore throat, or other symptoms of a cold or flu. Do not treat yourself. This drug decreases your body's ability to fight infections. Try to avoid being around people who are sick. This medicine may increase your risk to bruise or bleed. Call your doctor or health care professional if you notice any unusual bleeding. Be careful brushing and flossing your teeth or using a toothpick because you may get an infection or bleed more easily. If you have any dental work done, tell your dentist you are receiving this medicine. Avoid taking products that contain aspirin, acetaminophen, ibuprofen, naproxen, or ketoprofen unless instructed by your doctor. These medicines may hide a fever. Women should inform their doctor if they wish to become pregnant or think they might be pregnant. There is a potential for serious side effects to an unborn child. Talk to your health care professional or pharmacist for more information. Do not breast-feed an infant while taking this medicine. What side effects may I notice from receiving this medicine? Side effects that you should report to your doctor or health care professional as soon as possible: -allergic reactions like skin rash, itching or hives, swelling of the face, lips, or tongue -low blood counts - this medicine may decrease the number of white  blood cells, red blood cells and platelets. You may be at increased risk for infections and bleeding. -signs of infection - fever or chills, cough, sore throat, pain or difficulty passing urine -signs of decreased platelets or bleeding - bruising, pinpoint red spots on the skin, black, tarry stools, blood in the urine -signs of decreased red blood cells - unusually weak or tired, fainting spells, lightheadedness -breathing problems -chest pain -mouth sores -nausea and vomiting -pain, swelling, redness at site where injected -pain, tingling, numbness in the hands or feet -stomach pain -swelling of ankles, feet, hands -unusual bleeding Side effects that usually do not require medical attention (report to your doctor or health care professional if they continue or are bothersome): -constipation -diarrhea -hair loss -loss of appetite -stomach upset This list may not describe all possible side effects. Call your doctor for medical advice about side effects. You may report side effects to FDA at 1-800-FDA-1088. Where should I keep my medicine? This drug is given in a hospital or clinic and will not be stored at home. NOTE: This sheet is a summary. It may not cover all possible information. If you have questions about this medicine, talk to your doctor, pharmacist, or health care provider.  2018 Elsevier/Gold Standard (2008-04-19 18:45:54)

## 2017-06-26 ENCOUNTER — Ambulatory Visit
Admission: RE | Admit: 2017-06-26 | Discharge: 2017-06-26 | Disposition: A | Discharge status: Home / Self Care | Payer: Medicare Other | Source: Ambulatory Visit | Attending: Internal Medicine | Admitting: Internal Medicine

## 2017-06-26 ENCOUNTER — Inpatient Hospital Stay: Payer: Medicare Other | Attending: Internal Medicine

## 2017-06-26 ENCOUNTER — Telehealth: Payer: Self-pay | Admitting: *Deleted

## 2017-06-26 DIAGNOSIS — C3412 Malignant neoplasm of upper lobe, left bronchus or lung: Secondary | ICD-10-CM | POA: Diagnosis present

## 2017-06-26 DIAGNOSIS — C7802 Secondary malignant neoplasm of left lung: Secondary | ICD-10-CM | POA: Insufficient documentation

## 2017-06-26 DIAGNOSIS — I890 Lymphedema, not elsewhere classified: Secondary | ICD-10-CM | POA: Insufficient documentation

## 2017-06-26 DIAGNOSIS — R05 Cough: Secondary | ICD-10-CM | POA: Insufficient documentation

## 2017-06-26 DIAGNOSIS — I252 Old myocardial infarction: Secondary | ICD-10-CM | POA: Insufficient documentation

## 2017-06-26 DIAGNOSIS — Z87442 Personal history of urinary calculi: Secondary | ICD-10-CM | POA: Insufficient documentation

## 2017-06-26 DIAGNOSIS — Z955 Presence of coronary angioplasty implant and graft: Secondary | ICD-10-CM | POA: Insufficient documentation

## 2017-06-26 DIAGNOSIS — Z923 Personal history of irradiation: Secondary | ICD-10-CM | POA: Insufficient documentation

## 2017-06-26 DIAGNOSIS — Z9221 Personal history of antineoplastic chemotherapy: Secondary | ICD-10-CM | POA: Insufficient documentation

## 2017-06-26 DIAGNOSIS — J90 Pleural effusion, not elsewhere classified: Secondary | ICD-10-CM | POA: Insufficient documentation

## 2017-06-26 DIAGNOSIS — Z8673 Personal history of transient ischemic attack (TIA), and cerebral infarction without residual deficits: Secondary | ICD-10-CM | POA: Insufficient documentation

## 2017-06-26 DIAGNOSIS — Z87891 Personal history of nicotine dependence: Secondary | ICD-10-CM | POA: Insufficient documentation

## 2017-06-26 DIAGNOSIS — I2510 Atherosclerotic heart disease of native coronary artery without angina pectoris: Secondary | ICD-10-CM | POA: Diagnosis not present

## 2017-06-26 DIAGNOSIS — Z95828 Presence of other vascular implants and grafts: Secondary | ICD-10-CM

## 2017-06-26 DIAGNOSIS — Z7982 Long term (current) use of aspirin: Secondary | ICD-10-CM | POA: Insufficient documentation

## 2017-06-26 DIAGNOSIS — Z5111 Encounter for antineoplastic chemotherapy: Secondary | ICD-10-CM | POA: Insufficient documentation

## 2017-06-26 DIAGNOSIS — I469 Cardiac arrest, cause unspecified: Secondary | ICD-10-CM | POA: Insufficient documentation

## 2017-06-26 MED ORDER — LIDOCAINE-PRILOCAINE 2.5-2.5 % EX CREA
1.0000 | TOPICAL_CREAM | CUTANEOUS | 6 refills | Status: DC | PRN
Start: 2017-06-26 — End: 2017-06-27

## 2017-06-26 NOTE — Progress Notes (Signed)
*  PRELIMINARY RESULTS* Echocardiogram 2D Echocardiogram has been performed.  Sherrie Sport 06/26/2017, 11:20 AM

## 2017-06-26 NOTE — Telephone Encounter (Signed)
-----   Message from Leona Singleton, RN sent at 06/26/2017  2:49 PM EDT ----- Regarding: EMLA cream Fabio Pierce,  Can you call in EMLA cream for this patient?  Thank you, Basilia Jumbo

## 2017-06-26 NOTE — Telephone Encounter (Signed)
emla cream sent to pt's pharmacy

## 2017-06-27 ENCOUNTER — Encounter: Payer: Self-pay | Admitting: Internal Medicine

## 2017-06-27 ENCOUNTER — Other Ambulatory Visit: Payer: Self-pay | Admitting: Internal Medicine

## 2017-06-27 ENCOUNTER — Inpatient Hospital Stay (HOSPITAL_BASED_OUTPATIENT_CLINIC_OR_DEPARTMENT_OTHER): Payer: Medicare Other | Admitting: Internal Medicine

## 2017-06-27 ENCOUNTER — Inpatient Hospital Stay: Payer: Medicare Other

## 2017-06-27 VITALS — BP 99/65 | HR 106 | Temp 98.2°F | Wt 217.4 lb

## 2017-06-27 DIAGNOSIS — Z923 Personal history of irradiation: Secondary | ICD-10-CM | POA: Diagnosis not present

## 2017-06-27 DIAGNOSIS — C3412 Malignant neoplasm of upper lobe, left bronchus or lung: Secondary | ICD-10-CM | POA: Diagnosis not present

## 2017-06-27 DIAGNOSIS — Z5111 Encounter for antineoplastic chemotherapy: Secondary | ICD-10-CM | POA: Diagnosis not present

## 2017-06-27 DIAGNOSIS — Z87891 Personal history of nicotine dependence: Secondary | ICD-10-CM | POA: Diagnosis not present

## 2017-06-27 DIAGNOSIS — Z87442 Personal history of urinary calculi: Secondary | ICD-10-CM | POA: Diagnosis not present

## 2017-06-27 DIAGNOSIS — J90 Pleural effusion, not elsewhere classified: Secondary | ICD-10-CM | POA: Diagnosis not present

## 2017-06-27 DIAGNOSIS — C7802 Secondary malignant neoplasm of left lung: Secondary | ICD-10-CM | POA: Diagnosis not present

## 2017-06-27 DIAGNOSIS — I890 Lymphedema, not elsewhere classified: Secondary | ICD-10-CM | POA: Diagnosis not present

## 2017-06-27 DIAGNOSIS — R05 Cough: Secondary | ICD-10-CM | POA: Diagnosis not present

## 2017-06-27 DIAGNOSIS — I2510 Atherosclerotic heart disease of native coronary artery without angina pectoris: Secondary | ICD-10-CM | POA: Diagnosis not present

## 2017-06-27 DIAGNOSIS — I469 Cardiac arrest, cause unspecified: Secondary | ICD-10-CM | POA: Diagnosis not present

## 2017-06-27 DIAGNOSIS — Z7982 Long term (current) use of aspirin: Secondary | ICD-10-CM | POA: Diagnosis not present

## 2017-06-27 DIAGNOSIS — I252 Old myocardial infarction: Secondary | ICD-10-CM | POA: Diagnosis not present

## 2017-06-27 DIAGNOSIS — Z9221 Personal history of antineoplastic chemotherapy: Secondary | ICD-10-CM | POA: Diagnosis not present

## 2017-06-27 DIAGNOSIS — Z955 Presence of coronary angioplasty implant and graft: Secondary | ICD-10-CM | POA: Diagnosis not present

## 2017-06-27 DIAGNOSIS — Z7189 Other specified counseling: Secondary | ICD-10-CM | POA: Insufficient documentation

## 2017-06-27 DIAGNOSIS — Z8673 Personal history of transient ischemic attack (TIA), and cerebral infarction without residual deficits: Secondary | ICD-10-CM | POA: Diagnosis not present

## 2017-06-27 LAB — COMPREHENSIVE METABOLIC PANEL
ALT: 11 U/L — ABNORMAL LOW (ref 17–63)
AST: 23 U/L (ref 15–41)
Albumin: 3.6 g/dL (ref 3.5–5.0)
Alkaline Phosphatase: 77 U/L (ref 38–126)
Anion gap: 9 (ref 5–15)
BUN: 13 mg/dL (ref 6–20)
CO2: 25 mmol/L (ref 22–32)
Calcium: 9.1 mg/dL (ref 8.9–10.3)
Chloride: 103 mmol/L (ref 101–111)
Creatinine, Ser: 0.84 mg/dL (ref 0.61–1.24)
GFR calc Af Amer: >60 mL/min (ref >60)
GFR calc non Af Amer: >60 mL/min (ref >60)
Glucose, Bld: 142 mg/dL — ABNORMAL HIGH (ref 65–99)
Potassium: 4.4 mmol/L (ref 3.5–5.1)
Sodium: 137 mmol/L (ref 135–145)
Total Bilirubin: 0.7 mg/dL (ref 0.3–1.2)
Total Protein: 7 g/dL (ref 6.5–8.1)

## 2017-06-27 LAB — CBC WITH DIFFERENTIAL/PLATELET
Basophils Absolute: 0 10*3/uL (ref 0–0.1)
Basophils Relative: 1 %
Eosinophils Absolute: 0.2 10*3/uL (ref 0–0.7)
Eosinophils Relative: 4 %
HCT: 46.3 % (ref 40.0–52.0)
Hemoglobin: 15.4 g/dL (ref 13.0–18.0)
Lymphocytes Relative: 24 %
Lymphs Abs: 1.1 10*3/uL (ref 1.0–3.6)
MCH: 27.9 pg (ref 26.0–34.0)
MCHC: 33.4 g/dL (ref 32.0–36.0)
MCV: 83.6 fL (ref 80.0–100.0)
Monocytes Absolute: 0.4 10*3/uL (ref 0.2–1.0)
Monocytes Relative: 8 %
Neutro Abs: 3 10*3/uL (ref 1.4–6.5)
Neutrophils Relative %: 63 %
Platelets: 272 10*3/uL (ref 150–440)
RBC: 5.53 MIL/uL (ref 4.40–5.90)
RDW: 13.5 % (ref 11.5–14.5)
WBC: 4.7 10*3/uL (ref 3.8–10.6)

## 2017-06-27 MED ORDER — ONDANSETRON HCL 8 MG PO TABS: 8.0000 mg | ORAL_TABLET | Freq: Three times a day (TID) | ORAL | 1 refills | Status: AC | PRN

## 2017-06-27 MED ORDER — LIDOCAINE-PRILOCAINE 2.5-2.5 % EX CREA: 1.0000 "application " | TOPICAL_CREAM | CUTANEOUS | 6 refills | Status: AC | PRN

## 2017-06-27 MED ORDER — PROCHLORPERAZINE MALEATE 10 MG PO TABS: 10.0000 mg | ORAL_TABLET | Freq: Four times a day (QID) | ORAL | 1 refills | Status: AC | PRN

## 2017-06-27 NOTE — Assessment & Plan Note (Addendum)
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   # Discussed palliative treatment; incurable disease. I would recommend Systemic therapy doxorubicin-Latruvo. Discussed at tumor conference pulmonary interventions not recommended.   # potential side effects including but not limited to nausea vomiting diarrhea, hair loss neuropathy; drop in blood counts fatigue. Discussed the potential infusion reactions with Latruvo; and the cardiotoxicity with doxorubicin. 2-D echo shows normal ejection fraction 55-60%.  # Will send out Foundation 1 medicine testing; PDL 1 levels-pending.   # Will plan starting treatment/onpro- on 7/12th; CBC treatment on 719; follow-up with me August 2nd.   Addendum: Growth factor-Neulasta/On pro would be given as prophylaxis for chemotherapy-induced neutropenia to prevent febrile neutropenias. Prescriptions sent.

## 2017-06-27 NOTE — Addendum Note (Signed)
Addended by: Charlaine Dalton R on: 06/27/2017 07:06 PM   Modules accepted: Orders

## 2017-06-27 NOTE — Progress Notes (Signed)
[place] OFFICE PROGRESS NOTE  Patient Care Team: [place] Pink, MD as PCP - General (Family Medicine) Cammie Sickle, MD as Consulting Physician (Oncology) Christene Lye, MD (General Surgery)  Cancer Staging No matching staging information was found for the patient.   Oncology History   # 2007-  Carcinoma of lung adenocarcinoma. T2N0M0 tumor. Status-post right thoracotomy and upper lobectomy. Stage I-B.  #  Diagnosis of sarcoma.  Left thigh (at [facility]    neo adjuvant radiation  and surgery ,  In November of 2011.pathology report shows poorly differentiated sarcoma, high grade.  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   # July 12th- Adria-Ola   # June 2018- Foundation One testing-Pending.  -----------------------------------------------------------------------------------------  # dec 2017- radiation Pneumonitis- started pre  3. 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 # NOV 2017- Left LE swelling ? 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  In the interim patient underwent a 2-D echo; Mediport placement; chemotherapy education. In the interim he has not heard from [facility] sarcoma clinic.  Patient complains of continued cough not improved getting worse. Patient continues  to use a nebulizer with minimal improvement of his cough/shortness of breath. No wheezing. No hemoptysis.  Appetite is good. No weight loss. Denies any headaches.   REVIEW OF SYSTEMS:  A complete 10 point review of system is done which is negative except mentioned above/history of present illness.   PAST MEDICAL HISTORY :  Past Medical History:  Diagnosis Date  . Cancer [facility]) 2007   Lung CA- Bilateral  . Coronary artery disease   . History of kidney stones   . Lung cancer ([place]) 07/16/2015  . Myocardial infarction [facility]) 2000   coronary stent  . Sarcoma [facility]) 2013   Right groin resection.   . Shortness of breath dyspnea   . Stroke [facility]) 2005    PAST SURGICAL HISTORY :   Past Surgical History:  Procedure Laterality Date  . ABDOMINAL AORTIC ANEURYSM REPAIR  10/28/2012   Dr Lucky Cowboy  . CAROTID ENDARTERECTOMY Right   . COLONOSCOPY  2013  . CYSTOSCOPY WITH LITHOLAPAXY N/A 04/23/2017   Procedure: CYSTOSCOPY WITH LITHOLAPAXY;  Surgeon: Nickie Retort, MD;  Location: [facility] ORS;  Service: Urology;  Laterality: N/A;  . ENDOBRONCHIAL ULTRASOUND N/A 08/08/2016   Procedure: ENDOBRONCHIAL ULTRASOUND;  Surgeon: Vilinda Boehringer, MD;  Location: [facility] ORS;  Service: Cardiopulmonary;  Laterality: N/A;  . EYE SURGERY Bilateral 2010  . HOLMIUM LASER APPLICATION N/A 7/0/6237   Procedure: HOLMIUM LASER APPLICATION;  Surgeon: Nickie Retort, MD;  Location: [facility] ORS;  Service: Urology;  Laterality: N/A;  . PORTA CATH INSERTION N/A 06/23/2017   Procedure: Glori Luis Cath Insertion;  Surgeon: Algernon Huxley, MD;  Location: [place] CV LAB;  Service: Cardiovascular;  Laterality: N/A;  . SKIN CANCER EXCISION Left   . THORACOTOMY Bilateral    Lobectomy left lower lobe/sarcoma resection.   FAMILY HISTORY :   Family History  Problem Relation Age of Onset  . Hematuria Neg Hx   . Prostate cancer Neg Hx    Family history of cancer/lung  SOCIAL HISTORY:  Currently not smoking. Social History  Substance Use Topics   . Smoking status: Former Smoker    Quit date: 07/31/2003  . Smokeless tobacco: Never Used  . Alcohol use No    ALLERGIES:  has No Known Allergies.  MEDICATIONS:  Current Outpatient Prescriptions  Medication Sig Dispense Refill  . albuterol (PROVENTIL HFA;VENTOLIN HFA) 108 (90 Base) MCG/ACT inhaler Inhale 2 puffs into the lungs every 6 (six) hours as needed for wheezing or shortness of breath. 1 Inhaler 0  . AMBULATORY NON FORMULARY MEDICATION Medication Name: incentive spirometer 1 each 0  . Ascorbic Acid (VITAMIN C) 100 MG tablet Take 100 mg by mouth daily.    Marland Kitchen aspirin EC 81 MG tablet Take 81 mg by mouth every evening.     . Cholecalciferol (VITAMIN D) 2000 units tablet Take 2,000 Units by mouth daily.    . Coenzyme Q10 (CO Q 10) 100 MG CAPS Take 100 mg by mouth daily.    . diphenhydrAMINE (BENADRYL) 25 MG tablet Take 25 mg by mouth daily as needed for allergies.    . DiphenhydrAMINE HCl, Sleep, (SLEEP AID) 50 MG CAPS Take 50 mg by mouth at bedtime.    Marland Kitchen ipratropium-albuterol (DUONEB) 0.5-2.5 (3) MG/3ML SOLN Use one vial via nebulizer every 6 (six) hours as needed. (Patient taking differently: Use one vial via nebulizer every 6 (six) hours as needed for shortness of breath) 360 mL 1  . lidocaine-prilocaine (EMLA) cream Apply 1 application topically as needed. 30 g 6  . tamsulosin (FLOMAX) 0.4 MG CAPS capsule Take 0.4 mg by mouth at bedtime.    . Tiotropium Bromide Monohydrate (SPIRIVA RESPIMAT) 2.5 MCG/ACT AERS Inhale 2.5 mcg into the lungs 2 (two) times daily. (Patient taking differently: Inhale 2.5 mcg into the lungs 2 (two) times daily as needed (shortness of breath). ) 1 Inhaler 12  . vitamin B-12 (CYANOCOBALAMIN) 1000 MCG tablet Take 1,000 mcg by mouth daily.    . Omega-3 Fatty Acids (FISH OIL) 1000 MG CAPS Take 1,000 mg by mouth daily.      Current Facility-Administered Medications  Medication Dose Route Frequency Provider Last Rate Last Dose  . ceFAZolin (ANCEF) 1 g in dextrose 5  % 100 mL IVPB  1 g Intravenous Once Algernon Huxley, MD        PHYSICAL EXAMINATION: ECOG PERFORMANCE STATUS: 0 - Asymptomatic  BP 99/65 (BP Location: Left Arm, Patient Position: Sitting)   Pulse (!) 106   Temp 98.2 F (36.8 C) (Tympanic)   Wt 217 lb 6 oz (98.6 kg)   BMI 27.91 kg/m   Filed Weights   06/27/17 1505  Weight: 217 lb 6 oz (98.6 kg)    GENERAL: Well-nourished well-developed; Alert, no distress and comfortable.   Accompanied by his wife. EYES: no pallor or icterus OROPHARYNX: no thrush or ulceration; good dentition  NECK: supple, no masses felt LYMPH:  no palpable lymphadenopathy in the cervical, axillary or inguinal regions LUNGS: decreased breath sounds to auscultation on the left side compared to the right. No wheeze or crackles HEART/CVS: regular rate & rhythm and no murmurs; Bil LE- none.  ABDOMEN:abdomen soft, non-tender  and normal bowel sounds Musculoskeletal:no cyanosis of digits and no clubbing  PSYCH: alert & oriented x 3 with fluent speech NEURO: no focal motor/sensory deficits SKIN:  No rash.  LABORATORY DATA:  I have reviewed the data as listed    Component Value Date/Time   NA 137 06/27/2017 1435   NA 138 01/03/2015 0942   K 4.4 06/27/2017 1435   K 4.8 01/03/2015 0942   CL 103 06/27/2017 1435   CL 100 01/03/2015 0942   CO2 25 06/27/2017 1435   CO2 30 01/03/2015 0942   GLUCOSE 142 (H) 06/27/2017 1435   GLUCOSE 117 (H) 01/03/2015 0942   BUN 13 06/27/2017 1435   BUN 14 01/03/2015 0942   CREATININE 0.84 06/27/2017 1435   CREATININE 1.05 01/03/2015 0942   CALCIUM 9.1 06/27/2017 1435   CALCIUM 9.2 01/03/2015 0942   PROT 7.0 06/27/2017 1435   PROT 7.8 01/03/2015 0942   ALBUMIN 3.6 06/27/2017 1435   ALBUMIN 4.0 01/03/2015 0942   AST 23 06/27/2017 1435   AST 24 01/03/2015 0942   ALT 11 (L) 06/27/2017 1435   ALT 34 01/03/2015 0942   ALKPHOS 77 06/27/2017 1435   ALKPHOS 73 01/03/2015 0942   BILITOT 0.7 06/27/2017 1435   BILITOT 0.6 01/03/2015  0942   GFRNONAA >60 06/27/2017 1435   GFRNONAA >60 01/03/2015 0942   GFRNONAA >60 06/29/2014 0947   GFRAA >60 06/27/2017 1435   GFRAA >60 01/03/2015 0942   GFRAA >60 06/29/2014 0947    No results found for: SPEP, UPEP  Lab Results  Component Value Date   WBC 4.7 06/27/2017   NEUTROABS 3.0 06/27/2017   HGB 15.4 06/27/2017   HCT 46.3 06/27/2017   MCV 83.6 06/27/2017   PLT 272 06/27/2017      Chemistry      Component Value Date/Time   NA 137 06/27/2017 1435   NA 138 01/03/2015 0942   K 4.4 06/27/2017 1435   K 4.8 01/03/2015 0942   CL 103 06/27/2017 1435   CL 100 01/03/2015 0942   CO2 25 06/27/2017 1435   CO2 30 01/03/2015 0942   BUN 13 06/27/2017 1435   BUN 14 01/03/2015 0942   CREATININE 0.84 06/27/2017 1435   CREATININE 1.05 01/03/2015 0942      Component Value Date/Time   CALCIUM 9.1 06/27/2017 1435   CALCIUM 9.2 01/03/2015 0942   ALKPHOS 77 06/27/2017 1435   ALKPHOS 73 01/03/2015 0942   AST 23 06/27/2017 1435   AST 24 01/03/2015 0942   ALT 11 (L) 06/27/2017 1435   ALT 34 01/03/2015 0942   BILITOT 0.7 06/27/2017 1435   BILITOT 0.6 01/03/2015 0942       RADIOGRAPHIC STUDIES: I have personally reviewed the radiological images as listed and agreed with the findings in the report. No results found.   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   # Discussed palliative treatment; incurable disease. I would recommend Systemic therapy doxorubicin-Latruvo. Discussed at tumor conference pulmonary interventions not recommended.   # potential side effects including but not limited to nausea vomiting diarrhea, hair loss neuropathy; drop in blood counts fatigue. Discussed the potential infusion reactions with Latruvo; and  the cardiotoxicity with doxorubicin. 2-D echo shows normal ejection fraction 55-60%.  # Will send out Foundation 1 medicine  testing; PDL 1 levels-pending.   # Will plan starting treatment/onpro- on 7/12th; CBC treatment on 719; follow-up with me August 2nd.    Orders Placed This Encounter  Procedures  . CBC with Differential    Standing Status:   Future    Standing Expiration Date:   06/27/2018  . Comprehensive metabolic panel    Standing Status:   Future    Standing Expiration Date:   06/27/2018  . CBC with Differential    Standing Status:   Future    Standing Expiration Date:   06/27/2018  . CBC with Differential    Standing Status:   Future    Standing Expiration Date:   06/27/2018  . Comprehensive metabolic panel    Standing Status:   Future    Standing Expiration Date:   06/27/2018  . CBC with Differential    Standing Status:   Future    Standing Expiration Date:   06/27/2018   All questions were answered. The patient knows to call the clinic with any problems, questions or concerns.      Cammie Sickle, MD 06/27/2017 5:27 PM

## 2017-06-27 NOTE — Progress Notes (Signed)
DISCONTINUE OFF PATHWAY REGIMEN - [Other Dx]   [id number]:Docetaxel + Gemcitabine (100/900) q21d:   A cycle is every 21 days:     Gemcitabine      Docetaxel   **Always confirm dose/schedule in your pharmacy ordering system**    REASON: Other Reason PRIOR TREATMENT: Docetaxel + Gemcitabine (100/900) q21d  START OFF PATHWAY REGIMEN - [Other Dx]   [id number]:Olaratumab 15 mg/kg D1,8 + Doxorubicin 75 mg/m2 D1 q21 Days x 6 Cycles, f/b Olaratumab D1,8 q21 Days:   A cycle is every 21 days:     Olaratumab      Doxorubicin   **Always confirm dose/schedule in your pharmacy ordering system**    Patient Characteristics: Intent of Therapy: Non-Curative / Palliative Intent, Discussed with Patient

## 2017-06-27 NOTE — Progress Notes (Signed)
Patient here today for follow up.   

## 2017-06-30 ENCOUNTER — Other Ambulatory Visit: Payer: Self-pay | Admitting: Internal Medicine

## 2017-07-03 ENCOUNTER — Emergency Department: Payer: Medicare Other

## 2017-07-03 ENCOUNTER — Encounter: Payer: Self-pay | Admitting: Emergency Medicine

## 2017-07-03 ENCOUNTER — Inpatient Hospital Stay
Admission: EM | Admit: 2017-07-03 | Discharge: 2017-07-23 | DRG: 915 | Disposition: E | Payer: Medicare Other | Attending: Internal Medicine | Admitting: Internal Medicine

## 2017-07-03 ENCOUNTER — Inpatient Hospital Stay (HOSPITAL_BASED_OUTPATIENT_CLINIC_OR_DEPARTMENT_OTHER): Payer: Medicare Other | Admitting: Internal Medicine

## 2017-07-03 ENCOUNTER — Inpatient Hospital Stay: Payer: Medicare Other

## 2017-07-03 ENCOUNTER — Other Ambulatory Visit: Payer: Self-pay

## 2017-07-03 VITALS — BP 152/86 | HR 79 | Temp 97.8°F | Resp 18

## 2017-07-03 DIAGNOSIS — Z515 Encounter for palliative care: Secondary | ICD-10-CM

## 2017-07-03 DIAGNOSIS — Z66 Do not resuscitate: Secondary | ICD-10-CM | POA: Diagnosis present

## 2017-07-03 DIAGNOSIS — E872 Acidosis: Secondary | ICD-10-CM | POA: Diagnosis present

## 2017-07-03 DIAGNOSIS — I468 Cardiac arrest due to other underlying condition: Secondary | ICD-10-CM | POA: Diagnosis present

## 2017-07-03 DIAGNOSIS — I469 Cardiac arrest, cause unspecified: Secondary | ICD-10-CM

## 2017-07-03 DIAGNOSIS — Z87442 Personal history of urinary calculi: Secondary | ICD-10-CM

## 2017-07-03 DIAGNOSIS — I739 Peripheral vascular disease, unspecified: Secondary | ICD-10-CM | POA: Diagnosis present

## 2017-07-03 DIAGNOSIS — Z85828 Personal history of other malignant neoplasm of skin: Secondary | ICD-10-CM

## 2017-07-03 DIAGNOSIS — I2510 Atherosclerotic heart disease of native coronary artery without angina pectoris: Secondary | ICD-10-CM

## 2017-07-03 DIAGNOSIS — I248 Other forms of acute ischemic heart disease: Secondary | ICD-10-CM | POA: Diagnosis present

## 2017-07-03 DIAGNOSIS — I890 Lymphedema, not elsewhere classified: Secondary | ICD-10-CM

## 2017-07-03 DIAGNOSIS — R402113 Coma scale, eyes open, never, at hospital admission: Secondary | ICD-10-CM | POA: Diagnosis present

## 2017-07-03 DIAGNOSIS — R579 Shock, unspecified: Secondary | ICD-10-CM | POA: Diagnosis not present

## 2017-07-03 DIAGNOSIS — I638 Other cerebral infarction: Secondary | ICD-10-CM | POA: Diagnosis present

## 2017-07-03 DIAGNOSIS — R7989 Other specified abnormal findings of blood chemistry: Secondary | ICD-10-CM | POA: Diagnosis present

## 2017-07-03 DIAGNOSIS — I252 Old myocardial infarction: Secondary | ICD-10-CM | POA: Diagnosis not present

## 2017-07-03 DIAGNOSIS — R34 Anuria and oliguria: Secondary | ICD-10-CM | POA: Diagnosis present

## 2017-07-03 DIAGNOSIS — J9600 Acute respiratory failure, unspecified whether with hypoxia or hypercapnia: Secondary | ICD-10-CM | POA: Diagnosis not present

## 2017-07-03 DIAGNOSIS — R079 Chest pain, unspecified: Secondary | ICD-10-CM | POA: Diagnosis not present

## 2017-07-03 DIAGNOSIS — R402213 Coma scale, best verbal response, none, at hospital admission: Secondary | ICD-10-CM | POA: Diagnosis present

## 2017-07-03 DIAGNOSIS — C495 Malignant neoplasm of connective and soft tissue of pelvis: Secondary | ICD-10-CM | POA: Diagnosis not present

## 2017-07-03 DIAGNOSIS — R451 Restlessness and agitation: Secondary | ICD-10-CM | POA: Diagnosis not present

## 2017-07-03 DIAGNOSIS — S06309A Unspecified focal traumatic brain injury with loss of consciousness of unspecified duration, initial encounter: Secondary | ICD-10-CM | POA: Diagnosis not present

## 2017-07-03 DIAGNOSIS — C3412 Malignant neoplasm of upper lobe, left bronchus or lung: Secondary | ICD-10-CM | POA: Diagnosis not present

## 2017-07-03 DIAGNOSIS — C7802 Secondary malignant neoplasm of left lung: Secondary | ICD-10-CM

## 2017-07-03 DIAGNOSIS — C499 Malignant neoplasm of connective and soft tissue, unspecified: Secondary | ICD-10-CM | POA: Diagnosis not present

## 2017-07-03 DIAGNOSIS — Z7189 Other specified counseling: Secondary | ICD-10-CM | POA: Diagnosis not present

## 2017-07-03 DIAGNOSIS — R0602 Shortness of breath: Secondary | ICD-10-CM | POA: Diagnosis not present

## 2017-07-03 DIAGNOSIS — Z955 Presence of coronary angioplasty implant and graft: Secondary | ICD-10-CM

## 2017-07-03 DIAGNOSIS — R402333 Coma scale, best motor response, abnormal, at hospital admission: Secondary | ICD-10-CM | POA: Diagnosis present

## 2017-07-03 DIAGNOSIS — J9819 Other pulmonary collapse: Secondary | ICD-10-CM | POA: Diagnosis present

## 2017-07-03 DIAGNOSIS — J9601 Acute respiratory failure with hypoxia: Secondary | ICD-10-CM | POA: Diagnosis present

## 2017-07-03 DIAGNOSIS — C7800 Secondary malignant neoplasm of unspecified lung: Secondary | ICD-10-CM | POA: Diagnosis not present

## 2017-07-03 DIAGNOSIS — Z8679 Personal history of other diseases of the circulatory system: Secondary | ICD-10-CM

## 2017-07-03 DIAGNOSIS — E785 Hyperlipidemia, unspecified: Secondary | ICD-10-CM | POA: Diagnosis present

## 2017-07-03 DIAGNOSIS — G9340 Encephalopathy, unspecified: Secondary | ICD-10-CM | POA: Diagnosis not present

## 2017-07-03 DIAGNOSIS — Z9221 Personal history of antineoplastic chemotherapy: Secondary | ICD-10-CM

## 2017-07-03 DIAGNOSIS — J90 Pleural effusion, not elsewhere classified: Secondary | ICD-10-CM | POA: Diagnosis not present

## 2017-07-03 DIAGNOSIS — Z87891 Personal history of nicotine dependence: Secondary | ICD-10-CM

## 2017-07-03 DIAGNOSIS — R402433 Glasgow coma scale score 3-8, at hospital admission: Secondary | ICD-10-CM | POA: Diagnosis present

## 2017-07-03 DIAGNOSIS — T451X5A Adverse effect of antineoplastic and immunosuppressive drugs, initial encounter: Secondary | ICD-10-CM | POA: Diagnosis present

## 2017-07-03 DIAGNOSIS — I639 Cerebral infarction, unspecified: Secondary | ICD-10-CM | POA: Diagnosis not present

## 2017-07-03 DIAGNOSIS — N170 Acute kidney failure with tubular necrosis: Secondary | ICD-10-CM | POA: Diagnosis present

## 2017-07-03 DIAGNOSIS — Z8673 Personal history of transient ischemic attack (TIA), and cerebral infarction without residual deficits: Secondary | ICD-10-CM | POA: Diagnosis not present

## 2017-07-03 DIAGNOSIS — R05 Cough: Secondary | ICD-10-CM

## 2017-07-03 DIAGNOSIS — G931 Anoxic brain damage, not elsewhere classified: Secondary | ICD-10-CM | POA: Diagnosis present

## 2017-07-03 DIAGNOSIS — T886XXA Anaphylactic reaction due to adverse effect of correct drug or medicament properly administered, initial encounter: Principal | ICD-10-CM | POA: Diagnosis present

## 2017-07-03 DIAGNOSIS — I959 Hypotension, unspecified: Secondary | ICD-10-CM | POA: Diagnosis present

## 2017-07-03 DIAGNOSIS — Z452 Encounter for adjustment and management of vascular access device: Secondary | ICD-10-CM

## 2017-07-03 DIAGNOSIS — Z923 Personal history of irradiation: Secondary | ICD-10-CM

## 2017-07-03 DIAGNOSIS — J9690 Respiratory failure, unspecified, unspecified whether with hypoxia or hypercapnia: Secondary | ICD-10-CM | POA: Diagnosis not present

## 2017-07-03 DIAGNOSIS — J449 Chronic obstructive pulmonary disease, unspecified: Secondary | ICD-10-CM | POA: Diagnosis present

## 2017-07-03 DIAGNOSIS — I4901 Ventricular fibrillation: Secondary | ICD-10-CM | POA: Diagnosis present

## 2017-07-03 DIAGNOSIS — Z79899 Other long term (current) drug therapy: Secondary | ICD-10-CM | POA: Diagnosis not present

## 2017-07-03 DIAGNOSIS — J9602 Acute respiratory failure with hypercapnia: Secondary | ICD-10-CM | POA: Diagnosis present

## 2017-07-03 DIAGNOSIS — Z6830 Body mass index (BMI) 30.0-30.9, adult: Secondary | ICD-10-CM

## 2017-07-03 DIAGNOSIS — Y92230 Patient room in hospital as the place of occurrence of the external cause: Secondary | ICD-10-CM | POA: Diagnosis present

## 2017-07-03 DIAGNOSIS — Z7982 Long term (current) use of aspirin: Secondary | ICD-10-CM

## 2017-07-03 DIAGNOSIS — R0902 Hypoxemia: Secondary | ICD-10-CM

## 2017-07-03 LAB — COMPREHENSIVE METABOLIC PANEL
ALBUMIN: 3.7 g/dL (ref 3.5–5.0)
ALT: 13 U/L — ABNORMAL LOW (ref 17–63)
ALT: 18 U/L (ref 17–63)
ANION GAP: 8 (ref 5–15)
AST: 25 U/L (ref 15–41)
AST: 41 U/L (ref 15–41)
Albumin: 3 g/dL — ABNORMAL LOW (ref 3.5–5.0)
Alkaline Phosphatase: 79 U/L (ref 38–126)
Alkaline Phosphatase: 87 U/L (ref 38–126)
Anion gap: 15 (ref 5–15)
BILIRUBIN TOTAL: 0.8 mg/dL (ref 0.3–1.2)
BUN: 11 mg/dL (ref 6–20)
BUN: 11 mg/dL (ref 6–20)
CALCIUM: 9.5 mg/dL (ref 8.9–10.3)
CHLORIDE: 103 mmol/L (ref 101–111)
CO2: 27 mmol/L (ref 22–32)
CO2: 18 mmol/L — ABNORMAL LOW (ref 22–32)
Calcium: 10.9 mg/dL — ABNORMAL HIGH (ref 8.9–10.3)
Chloride: 105 mmol/L (ref 101–111)
Creatinine, Ser: 0.99 mg/dL (ref 0.61–1.24)
Creatinine, Ser: 1.03 mg/dL (ref 0.61–1.24)
GFR calc Af Amer: >60 mL/min (ref >60)
GFR calc non Af Amer: >60 mL/min (ref >60)
GLUCOSE: 126 mg/dL — AB (ref 65–99)
Glucose, Bld: 184 mg/dL — ABNORMAL HIGH (ref 65–99)
POTASSIUM: 4.5 mmol/L (ref 3.5–5.1)
POTASSIUM: 4.3 mmol/L (ref 3.5–5.1)
SODIUM: 138 mmol/L (ref 135–145)
Sodium: 138 mmol/L (ref 135–145)
TOTAL PROTEIN: 6.1 g/dL — AB (ref 6.5–8.1)
Total Bilirubin: 0.6 mg/dL (ref 0.3–1.2)
Total Protein: 7.1 g/dL (ref 6.5–8.1)

## 2017-07-03 LAB — BLOOD GAS, ARTERIAL
ACID-BASE DEFICIT: 15.3 mmol/L — AB (ref 0.0–2.0)
Acid-base deficit: 9.4 mmol/L — ABNORMAL HIGH (ref 0.0–2.0)
BICARBONATE: 17.6 mmol/L — AB (ref 20.0–28.0)
Bicarbonate: 13.7 mmol/L — ABNORMAL LOW (ref 20.0–28.0)
FIO2: 1
FIO2: 1
LHR: 16 {breaths}/min
MECHVT: 500 mL
MECHVT: 500 mL
O2 SAT: 99.8 %
O2 SAT: 99.9 %
PATIENT TEMPERATURE: 37
PCO2 ART: 42 mmHg (ref 32.0–48.0)
PEEP/CPAP: 5 cmH2O
PEEP/CPAP: 5 cmH2O
PH ART: 7.12 — AB (ref 7.350–7.450)
PH ART: 7.24 — AB (ref 7.350–7.450)
PO2 ART: 279 mmHg — AB (ref 83.0–108.0)
PO2 ART: 328 mmHg — AB (ref 83.0–108.0)
Patient temperature: 37
RATE: 16 resp/min
pCO2 arterial: 41 mmHg (ref 32.0–48.0)

## 2017-07-03 LAB — CBC WITH DIFFERENTIAL/PLATELET
BASOS PCT: 1 %
Basophils Absolute: 0 10*3/uL (ref 0–0.1)
EOS ABS: 0.1 10*3/uL (ref 0–0.7)
EOS PCT: 2 %
HCT: 46.2 % (ref 40.0–52.0)
HEMOGLOBIN: 15.7 g/dL (ref 13.0–18.0)
LYMPHS ABS: 0.9 10*3/uL — AB (ref 1.0–3.6)
Lymphocytes Relative: 18 %
MCH: 28.2 pg (ref 26.0–34.0)
MCHC: 34 g/dL (ref 32.0–36.0)
MCV: 82.9 fL (ref 80.0–100.0)
MONOS PCT: 9 %
Monocytes Absolute: 0.4 10*3/uL (ref 0.2–1.0)
NEUTROS PCT: 70 %
Neutro Abs: 3.5 10*3/uL (ref 1.4–6.5)
PLATELETS: 274 10*3/uL (ref 150–440)
RBC: 5.57 MIL/uL (ref 4.40–5.90)
RDW: 13.8 % (ref 11.5–14.5)
WBC: 5.1 10*3/uL (ref 3.8–10.6)

## 2017-07-03 LAB — CBC
HCT: 56.4 % — ABNORMAL HIGH (ref 40.0–52.0)
Hemoglobin: 18.4 g/dL — ABNORMAL HIGH (ref 13.0–18.0)
MCH: 27.8 pg (ref 26.0–34.0)
MCHC: 32.6 g/dL (ref 32.0–36.0)
MCV: 85.2 fL (ref 80.0–100.0)
PLATELETS: 296 10*3/uL (ref 150–440)
RBC: 6.62 MIL/uL — AB (ref 4.40–5.90)
RDW: 14.3 % (ref 11.5–14.5)
WBC: 19.6 10*3/uL — AB (ref 3.8–10.6)

## 2017-07-03 LAB — TROPONIN I: Troponin I: <0.03 ng/mL (ref <0.03)

## 2017-07-03 LAB — TRIGLYCERIDES: TRIGLYCERIDES: 209 mg/dL — AB (ref <150)

## 2017-07-03 MED ORDER — EPINEPHRINE PF 1 MG/10ML IJ SOSY
1.0000 mg | PREFILLED_SYRINGE | Freq: Once | INTRAMUSCULAR | Status: AC
  Administered 2017-07-03: 1 mg via INTRAVENOUS

## 2017-07-03 MED ORDER — VASOPRESSIN 20 UNIT/ML IV SOLN
0.0300 [IU]/min | INTRAVENOUS | Status: DC
  Administered 2017-07-03: 0.03 [IU]/min via INTRAVENOUS
  Filled 2017-07-03 (×2): qty 2

## 2017-07-03 MED ORDER — FENTANYL CITRATE (PF) 100 MCG/2ML IJ SOLN
100.0000 ug | Freq: Once | INTRAMUSCULAR | Status: AC
Start: 2017-07-03 — End: 2017-07-03
  Administered 2017-07-03: 100 ug via INTRAVENOUS

## 2017-07-03 MED ORDER — SODIUM BICARBONATE 8.4 % IV SOLN
INTRAVENOUS | Status: AC
  Filled 2017-07-03: qty 100

## 2017-07-03 MED ORDER — ACETAMINOPHEN 325 MG PO TABS: 650.0000 mg | ORAL_TABLET | Freq: Four times a day (QID) | ORAL | Status: DC | PRN

## 2017-07-03 MED ORDER — SODIUM BICARBONATE 8.4 % IV SOLN
INTRAVENOUS | Status: DC
  Administered 2017-07-03: 19:00:00 via INTRAVENOUS
  Filled 2017-07-03 (×3): qty 850

## 2017-07-03 MED ORDER — CHLORHEXIDINE GLUCONATE 0.12% ORAL RINSE (MEDLINE KIT)
15.0000 mL | Freq: Two times a day (BID) | OROMUCOSAL | Status: DC
  Administered 2017-07-03 – 2017-07-04 (×3): 15 mL via OROMUCOSAL

## 2017-07-03 MED ORDER — FENTANYL 2500MCG IN NS 250ML (10MCG/ML) PREMIX INFUSION
INTRAVENOUS | Status: AC
  Filled 2017-07-03: qty 250

## 2017-07-03 MED ORDER — FAMOTIDINE IN NACL 20-0.9 MG/50ML-% IV SOLN
20.0000 mg | Freq: Once | INTRAVENOUS | Status: AC
Start: 2017-07-03 — End: 2017-07-03
  Administered 2017-07-03: 20 mg via INTRAVENOUS

## 2017-07-03 MED ORDER — IPRATROPIUM-ALBUTEROL 0.5-2.5 (3) MG/3ML IN SOLN
3.0000 mL | Freq: Four times a day (QID) | RESPIRATORY_TRACT | Status: DC
  Administered 2017-07-03 – 2017-07-07 (×13): 3 mL via RESPIRATORY_TRACT
  Filled 2017-07-03 (×14): qty 3

## 2017-07-03 MED ORDER — PEGFILGRASTIM 6 MG/0.6ML ~~LOC~~ PSKT: 6.0000 mg | PREFILLED_SYRINGE | Freq: Once | SUBCUTANEOUS | Status: DC

## 2017-07-03 MED ORDER — SODIUM CHLORIDE 0.9 % IV SOLN
1570.0000 mg | Freq: Once | INTRAVENOUS | Status: AC
  Administered 2017-07-03: 1570 mg via INTRAVENOUS
  Filled 2017-07-03: qty 100

## 2017-07-03 MED ORDER — FENTANYL 2500MCG IN NS 250ML (10MCG/ML) PREMIX INFUSION
25.0000 ug/h | INTRAVENOUS | Status: DC
  Administered 2017-07-03: 100 ug/h via INTRAVENOUS
  Administered 2017-07-04: 200 ug/h via INTRAVENOUS
  Filled 2017-07-03: qty 250

## 2017-07-03 MED ORDER — METHYLPREDNISOLONE SODIUM SUCC 125 MG IJ SOLR: 60.0000 mg | INTRAMUSCULAR | Status: DC

## 2017-07-03 MED ORDER — ACETAMINOPHEN 325 MG PO TABS
650.0000 mg | ORAL_TABLET | Freq: Once | ORAL | Status: AC
Start: 2017-07-03 — End: 2017-07-03
  Administered 2017-07-03: 650 mg via ORAL

## 2017-07-03 MED ORDER — HYDROCODONE-ACETAMINOPHEN 5-325 MG PO TABS
1.0000 | ORAL_TABLET | ORAL | Status: DC | PRN
  Administered 2017-07-06: 2 via ORAL
  Filled 2017-07-03: qty 2

## 2017-07-03 MED ORDER — DOXORUBICIN HCL CHEMO IV INJECTION 2 MG/ML
60.0000 mg/m2 | Freq: Once | INTRAVENOUS | Status: DC
  Filled 2017-07-03: qty 68

## 2017-07-03 MED ORDER — DOPAMINE-DEXTROSE 3.2-5 MG/ML-% IV SOLN
INTRAVENOUS | Status: AC
  Filled 2017-07-03: qty 250

## 2017-07-03 MED ORDER — SODIUM BICARBONATE 8.4 % IV SOLN
50.0000 meq | Freq: Once | INTRAVENOUS | Status: AC
  Administered 2017-07-03: 50 meq via INTRAVENOUS

## 2017-07-03 MED ORDER — ONDANSETRON HCL 4 MG/2ML IJ SOLN
4.0000 mg | Freq: Four times a day (QID) | INTRAMUSCULAR | Status: DC | PRN
  Filled 2017-07-03: qty 2

## 2017-07-03 MED ORDER — NOREPINEPHRINE BITARTRATE 1 MG/ML IV SOLN
0.0000 ug/min | Freq: Once | INTRAVENOUS | Status: AC
  Administered 2017-07-03: 10 ug/min via INTRAVENOUS
  Filled 2017-07-03: qty 4

## 2017-07-03 MED ORDER — PROPOFOL 1000 MG/100ML IV EMUL
5.0000 ug/kg/min | Freq: Once | INTRAVENOUS | Status: AC
  Administered 2017-07-03: 10 ug/kg/min via INTRAVENOUS

## 2017-07-03 MED ORDER — PALONOSETRON HCL INJECTION 0.25 MG/5ML
0.2500 mg | Freq: Once | INTRAVENOUS | Status: AC
  Administered 2017-07-03: 0.25 mg via INTRAVENOUS

## 2017-07-03 MED ORDER — ONDANSETRON HCL 4 MG PO TABS: 4.0000 mg | ORAL_TABLET | Freq: Four times a day (QID) | ORAL | Status: DC | PRN

## 2017-07-03 MED ORDER — SODIUM CHLORIDE 0.9 % IV SOLN: INTRAVENOUS | Status: DC

## 2017-07-03 MED ORDER — HYDROCORTISONE NA SUCCINATE PF 100 MG IJ SOLR
50.0000 mg | Freq: Four times a day (QID) | INTRAMUSCULAR | Status: DC
  Administered 2017-07-03 – 2017-07-04 (×3): 50 mg via INTRAVENOUS
  Filled 2017-07-03 (×3): qty 2

## 2017-07-03 MED ORDER — AMIODARONE IV BOLUS ONLY 150 MG/100ML
150.0000 mg | Freq: Once | INTRAVENOUS | Status: AC
  Administered 2017-07-03: 150 mg via INTRAVENOUS

## 2017-07-03 MED ORDER — SODIUM BICARBONATE 8.4 % IV SOLN
50.0000 meq | Freq: Once | INTRAVENOUS | Status: AC
Start: 2017-07-03 — End: 2017-07-03
  Administered 2017-07-03: 50 meq via INTRAVENOUS

## 2017-07-03 MED ORDER — FENTANYL CITRATE (PF) 100 MCG/2ML IJ SOLN
INTRAMUSCULAR | Status: AC
  Administered 2017-07-03: 100 ug via INTRAVENOUS
  Filled 2017-07-03: qty 2

## 2017-07-03 MED ORDER — PROPOFOL 1000 MG/100ML IV EMUL
5.0000 ug/kg/min | INTRAVENOUS | Status: DC
  Administered 2017-07-03: 10 ug/kg/min via INTRAVENOUS
  Administered 2017-07-04: 15 ug/kg/min via INTRAVENOUS
  Filled 2017-07-03: qty 100

## 2017-07-03 MED ORDER — SODIUM BICARBONATE 8.4 % IV SOLN: 100.0000 meq | INTRAVENOUS | Status: DC

## 2017-07-03 MED ORDER — SODIUM CHLORIDE 0.9 % IV SOLN
Freq: Once | INTRAVENOUS | Status: AC
  Administered 2017-07-03: 12:00:00 via INTRAVENOUS
  Filled 2017-07-03: qty 1000

## 2017-07-03 MED ORDER — ENOXAPARIN SODIUM 40 MG/0.4ML ~~LOC~~ SOLN
40.0000 mg | SUBCUTANEOUS | Status: DC
  Administered 2017-07-03: 40 mg via SUBCUTANEOUS
  Filled 2017-07-03: qty 0.4

## 2017-07-03 MED ORDER — EPINEPHRINE PF 1 MG/ML IJ SOLN
0.5000 ug/min | INTRAVENOUS | Status: DC
  Filled 2017-07-03: qty 4

## 2017-07-03 MED ORDER — DIPHENHYDRAMINE HCL 50 MG/ML IJ SOLN
50.0000 mg | Freq: Once | INTRAMUSCULAR | Status: AC
  Administered 2017-07-03: 50 mg via INTRAVENOUS

## 2017-07-03 MED ORDER — SODIUM CHLORIDE 0.9 % IV SOLN
Freq: Once | INTRAVENOUS | Status: DC
  Filled 2017-07-03: qty 5

## 2017-07-03 MED ORDER — PROPOFOL 1000 MG/100ML IV EMUL
INTRAVENOUS | Status: AC
  Administered 2017-07-03: 10 ug/kg/min via INTRAVENOUS
  Filled 2017-07-03: qty 100

## 2017-07-03 MED ORDER — FENTANYL BOLUS VIA INFUSION
25.0000 ug | INTRAVENOUS | Status: DC | PRN
  Filled 2017-07-03: qty 25

## 2017-07-03 MED ORDER — ORAL CARE MOUTH RINSE
15.0000 mL | Freq: Four times a day (QID) | OROMUCOSAL | Status: DC
  Administered 2017-07-04 – 2017-07-05 (×5): 15 mL via OROMUCOSAL

## 2017-07-03 MED ORDER — FENTANYL CITRATE (PF) 100 MCG/2ML IJ SOLN
100.0000 ug | INTRAMUSCULAR | Status: DC | PRN
  Administered 2017-07-03 (×2): 100 ug via INTRAVENOUS
  Filled 2017-07-03: qty 2

## 2017-07-03 MED ORDER — NOREPINEPHRINE BITARTRATE 1 MG/ML IV SOLN
0.0000 ug/min | Freq: Once | INTRAVENOUS | Status: AC
  Administered 2017-07-03: 40 ug/min via INTRAVENOUS
  Filled 2017-07-03: qty 4

## 2017-07-03 MED ORDER — ENOXAPARIN SODIUM 40 MG/0.4ML ~~LOC~~ SOLN: 40.0000 mg | SUBCUTANEOUS | Status: DC

## 2017-07-03 MED ORDER — FAMOTIDINE IN NACL 20-0.9 MG/50ML-% IV SOLN
20.0000 mg | INTRAVENOUS | Status: DC
  Administered 2017-07-03 – 2017-07-04 (×2): 20 mg via INTRAVENOUS
  Filled 2017-07-03 (×2): qty 50

## 2017-07-03 MED ORDER — FOSAPREPITANT DIMEGLUMINE INJECTION 150 MG
Freq: Once | INTRAVENOUS | Status: AC
  Administered 2017-07-03: 13:00:00 via INTRAVENOUS
  Filled 2017-07-03: qty 5

## 2017-07-03 MED ORDER — ACETAMINOPHEN 650 MG RE SUPP: 650.0000 mg | Freq: Four times a day (QID) | RECTAL | Status: DC | PRN

## 2017-07-03 MED ORDER — MIDAZOLAM HCL 5 MG/5ML IJ SOLN
2.0000 mg | Freq: Once | INTRAMUSCULAR | Status: AC
Start: 2017-07-03 — End: 2017-07-03
  Administered 2017-07-03: 2 mg via INTRAVENOUS

## 2017-07-03 MED ORDER — DEXTROSE 5 % IV SOLN
0.0000 ug/min | INTRAVENOUS | Status: DC
  Administered 2017-07-03: 30 ug/min via INTRAVENOUS
  Administered 2017-07-03: 40 ug/min via INTRAVENOUS
  Filled 2017-07-03 (×3): qty 16

## 2017-07-03 MED FILL — Medication: Qty: 2 | Status: AC

## 2017-07-03 NOTE — ED Provider Notes (Signed)
[facility] Emergency Department Provider Note  Time seen: 2:57 PM  I have reviewed the triage vital signs and the nursing notes.   HISTORY  Chief Complaint Cardiac Arrest    HPI Stephen [name] is a 76 y.o. male with a past medical history of lung cancer, status post left-sided lung resection, MI, COPD, who presents to the emergency department as a cardiac arrest/CODE BLUE.According to report given by the infusion nurse the patient was receiving his first dose of chemotherapy when he slumped over and became unresponsive. They checked her pulse and could not find one so they called a CODE BLUE. I responded to the CODE BLUE to find the patient lying on the bed without any pulse. CPR was initiated upon my arrival. Patient was receiving bag-valve-mask ventilations. Patient received multiple rounds of medications was intubated by myself, was defibrillated multiple times, a pulse was regained and the patient was brought to the emergency department for continued workup and management.  Past Medical History:  Diagnosis Date  . Cancer [facility]) 2007   Lung CA- Bilateral  . Coronary artery disease   . History of kidney stones   . Lung cancer ([place]) 07/16/2015  . Myocardial infarction [facility]) 2000   coronary stent  . Sarcoma [facility]) 2013   Right groin resection.   . Shortness of breath dyspnea   . Stroke [facility]) 2005    Patient Active Problem List   Diagnosis Date Noted  . Goals of care, counseling/discussion 06/27/2017  . 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 Carotid stenosis 05/06/2017  . Cough 10/24/2016  . Left leg swelling 10/23/2016  . COPD (chronic obstructive pulmonary disease) ([place]) 09/07/2016  . COPD exacerbation ([place]) 09/06/2016  . Hilar mass   . Hilar adenopathy 07/25/2016  . DOE (dyspnea on exertion) 07/25/2016  . Malignant neoplasm of upper lobe, left bronchus or lung ([place]) 07/08/2016  . AAA (abdominal aortic aneurysm) without rupture ([place])  06/07/2015  . CAD in native artery 06/07/2015  . 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 Peripheral blood vessel disorder ([place]) 06/07/2015  . Peripheral vascular disease ([place]) 06/07/2015  . Abdominal aortic aneurysm (AAA) without rupture ([place]) 06/07/2015    Past Surgical History:  Procedure Laterality Date  . ABDOMINAL AORTIC ANEURYSM REPAIR  10/28/2012   Dr Lucky Cowboy  . CAROTID ENDARTERECTOMY Right   . COLONOSCOPY  2013  . CYSTOSCOPY WITH LITHOLAPAXY N/A 04/23/2017   Procedure: CYSTOSCOPY WITH LITHOLAPAXY;  Surgeon: Nickie Retort, MD;  Location: [facility] ORS;  Service: Urology;  Laterality: N/A;  . ENDOBRONCHIAL ULTRASOUND N/A 08/08/2016   Procedure: ENDOBRONCHIAL ULTRASOUND;  Surgeon: Vilinda Boehringer, MD;  Location: [facility] ORS;  Service: Cardiopulmonary;  Laterality: N/A;  . EYE SURGERY Bilateral 2010  . HOLMIUM LASER APPLICATION N/A 02/23/1961   Procedure: HOLMIUM LASER APPLICATION;  Surgeon: Nickie Retort, MD;  Location: [facility] ORS;  Service: Urology;  Laterality: N/A;  . PORTA CATH INSERTION N/A 06/23/2017   Procedure: Glori Luis Cath Insertion;  Surgeon: Algernon Huxley, MD;  Location: [place] CV LAB;  Service: Cardiovascular;  Laterality: N/A;  . SKIN CANCER EXCISION Left   . THORACOTOMY Bilateral     Prior to Admission medications   Medication Sig Start Date End Date Taking? Authorizing Provider  albuterol (PROVENTIL HFA;VENTOLIN HFA) 108 (90 Base) MCG/ACT inhaler Inhale 2 puffs into the lungs every 6 (six) hours as needed for wheezing or shortness of breath. 09/23/16  Yes Schaevitz, Randall An, MD  Ascorbic Acid (  VITAMIN C) 100 MG tablet Take 100 mg by mouth daily.   Yes [provider]  aspirin EC 81 MG tablet Take 81 mg by mouth every evening.    Yes [provider]  Cholecalciferol (VITAMIN D) 2000 units tablet Take 2,000 Units by mouth daily.   Yes [provider]  Coenzyme Q10 (CO Q 10) 100 MG CAPS Take 100 mg by mouth daily.   Yes  [provider]  diphenhydrAMINE (BENADRYL) 25 MG tablet Take 25 mg by mouth daily as needed for allergies.   Yes [provider]  DiphenhydrAMINE HCl, Sleep, (SLEEP AID) 50 MG CAPS Take 50 mg by mouth at bedtime.   Yes [provider]  ipratropium-albuterol (DUONEB) 0.5-2.5 (3) MG/3ML SOLN Use one vial via nebulizer every 6 (six) hours as needed. Patient taking differently: Use one vial via nebulizer every 6 (six) hours as needed for shortness of breath 04/16/17  Yes Kasa, Maretta Bees, MD  lidocaine-prilocaine (EMLA) cream Apply 1 application topically as needed. 06/27/17  Yes Cammie Sickle, MD  Omega-3 Fatty Acids (FISH OIL) 1000 MG CAPS Take 1,000 mg by mouth daily.    Yes [provider]  ondansetron (ZOFRAN) 8 MG tablet Take 1 tablet (8 mg total) by mouth every 8 (eight) hours as needed for nausea or vomiting (start 3 days; after chemo). 06/27/17  Yes Cammie Sickle, MD  prochlorperazine (COMPAZINE) 10 MG tablet Take 1 tablet (10 mg total) by mouth every 6 (six) hours as needed for nausea or vomiting. 06/27/17  Yes Cammie Sickle, MD  tamsulosin (FLOMAX) 0.4 MG CAPS capsule Take 0.4 mg by mouth at bedtime.   Yes [provider]  Tiotropium Bromide Monohydrate (SPIRIVA RESPIMAT) 2.5 MCG/ACT AERS Inhale 2.5 mcg into the lungs 2 (two) times daily. Patient taking differently: Inhale 2.5 mcg into the lungs 2 (two) times daily as needed (shortness of breath).  06/10/17  Yes Flora Lipps, MD  vitamin B-12 (CYANOCOBALAMIN) 1000 MCG tablet Take 1,000 mcg by mouth daily.   Yes [provider]  AMBULATORY NON FORMULARY MEDICATION Medication Name: incentive spirometer Patient not taking: Reported on 06/29/2017 09/23/16   Vilinda Boehringer, MD    No Known Allergies  Family History  Problem Relation Age of Onset  . Hematuria Neg Hx   . Prostate cancer Neg Hx     Social History Social History  Substance Use Topics  . Smoking status: Former  Smoker    Quit date: 07/31/2003  . Smokeless tobacco: Never Used  . Alcohol use No    Review of Systems Unable to obtain a review of systems due to cardiac arrest  ____________________________________________   PHYSICAL EXAM:  VITAL SIGNS: ED Triage Vitals  Enc Vitals Group     BP 07/16/2017 1430 91/69     Pulse Rate 06/27/2017 1430 (!) 107     Resp 07/02/2017 1430 (!) 22     Temp --      Temp src --      SpO2 06/23/2017 1430 100 %     Weight 06/23/2017 1439 217 lb (98.4 kg)     Height 06/23/2017 1439 6' 2"  (1.88 m)     Head Circumference --      Peak Flow --      Pain Score --      Pain Loc --      Pain Edu? --      Excl. in [place]? --     Constitutional: Unresponsive, cardiac arrest Eyes:  2-3 mm, reactive to light ENT   Head: Normocephalic and atraumatic   Mouth/Throat: Dry mucous membranes Cardiovascular: No pulse normal heart sounds. Respiratory: No spontaneous respiratory drive, equal breath sounds with ventilations Gastrointestinal: Soft, no distention, atraumatic. Musculoskeletal: Mottled extremities, atraumatic, mass to left groin Neurologic:  Unresponsive Skin:  Pale and cool Psychiatric: Unresponsive  ____________________________________________    EKG  EKG reviewed and interpreted by myself shows sinus tachycardia at 141 bpm with a widened QRS, right axis deviation, large and normal intervals with nonspecific ST changes no obvious ST elevation. We will repeat an EKG once the patient is more stable. ____________________________________________    RADIOLOGY  IMPRESSION: 1. New endotracheal tube in unremarkable position. 2. Known complete collapse of the left lung from hilar mass.  ____________________________________________   INITIAL IMPRESSION / ASSESSMENT AND PLAN / ED COURSE  Pertinent labs & imaging results that were available during my care of the patient were reviewed by me and considered in my medical decision making (see chart for  details).  Patient is a CODE BLUE patient from the infusion center. Patient was receiving a new chemotherapy drug per the nurse practitioner in the infusion center. The patient slumped over and became unresponsive. CODE BLUE was called, upon my arrival CPR was initiated upon my arrival. Patient was being ventilated with bag-valve-mask with equal breath sounds. I intubated the patient without medications, occasions. CPR was continued and patient remained in PEA arrest for nearly 20 minutes multiple rounds of medications given. Patient then went into ventricular fibrillation he was shocked multiple times and a pulse was regained. Pulse was lost multiple times but regained with each defibrillation. Patient was then brought to the emergency department right continued to care for the patient. Patient has lost his pulse several times in the emergency department but was quickly regained. Multiple rounds of medications given including epinephrine, calcium, sodium bicarbonate. Patient is currently on a propofol drip for sedation, as well as a norepinephrine drip and an amiodarone drip. Patient's current blood pressure 91/69. Throughout the patient's stay in the emergency Department he will occasionally make purposeful movement such as grabbing for your arms during CPR, or reaching for the endotracheal tube. However the patient continues to wax and wane as far as his stability or he will appear stable at times with a ill-appearing blood pressure on nor epi, however at times he will bradycardia down, becomes pale and mottled and lose a pulse. At this time the patient has a sustained pulse for approximately 15 minutes. I discussed the patient with intensive care unit as well as the hospitalist. Patient will be admitted to the ICU for further management.  Patient's ABG shows a 7.12 pH, sodium bicarbonate given.     INTUBATION Performed by: Harvest Dark  Required items: required blood products, implants,  devices, and special equipment available Patient identity confirmed: provided demographic data and hospital-assigned identification number Time out: Immediately prior to procedure a "time out" was called to verify the correct patient, procedure, equipment, support staff and site/side marked as required.  Indications: Cardiac arrest   Intubation method: 4.0 Mac blade direct laryngoscopy Glidescope Laryngoscopy  Preoxygenation: 100% BVM  Sedatives: None  Paralytic: None   Tube Size: 7.5 cuffed  Post-procedure assessment: chest rise and ETCO2 monitor Breath sounds: equal and absent over the epigastrium Tube secured with: ETT holder Chest x-ray interpreted by radiologist and me.  Chest x-ray findings: endotracheal tube in appropriate position  Patient tolerated the procedure well with no immediate complications.  Cardiopulmonary  Resuscitation (CPR) Procedure Note Directed/Performed by: Harvest Dark I personally directed ancillary staff and/or performed CPR in an effort to regain return of spontaneous circulation and to maintain cardiac, neuro and systemic perfusion.   CRITICAL CARE Performed by: Harvest Dark   Total critical care time: 120 minutes  Critical care time was exclusive of separately billable procedures and treating other patients.  Critical care was necessary to treat or prevent imminent or life-threatening deterioration.  Critical care was time spent personally by me on the following activities: development of treatment plan with patient and/or surrogate as well as nursing, discussions with consultants, evaluation of patient's response to treatment, examination of patient, obtaining history from patient or surrogate, ordering and performing treatments and interventions, ordering and review of laboratory studies, ordering and review of radiographic studies, pulse oximetry and re-evaluation of patient's  condition.   ----------------------------------------- 3:46 PM on 07/09/2017 -----------------------------------------  The hospitalist has seen the patient. Intensive this is seen the patient. We are starting the patient on epinephrine in addition a norepinephrine.  ____________________________________________   FINAL CLINICAL IMPRESSION(S) / ED DIAGNOSES  Cardiac arrest    Harvest Dark, MD 07/19/2017 1546

## 2017-07-03 NOTE — Progress Notes (Signed)
Chaplain received a page code blue for room 26. Chaplain escorted family to family room and provided support for the family.    07/09/2017 1420  Clinical Encounter Type  Visited With Patient;Patient and family together  Visit Type Initial;Spiritual support  Referral From Nurse  Consult/Referral To Chaplain  Spiritual Encounters  Spiritual Needs Emotional;Grief support

## 2017-07-03 NOTE — Progress Notes (Signed)
[place] OFFICE PROGRESS NOTE  Patient Care Team: [place] Pink, MD as PCP - General (Family Medicine) Cammie Sickle, MD as Consulting Physician (Oncology) Christene Lye, MD (General Surgery)  Cancer Staging No matching staging information was found for the patient.   Oncology History   # 2007-  Carcinoma of lung adenocarcinoma. T2N0M0 tumor. Status-post right thoracotomy and upper lobectomy. Stage I-B.  #  Diagnosis of sarcoma.  Left thigh (at [facility]    neo adjuvant radiation  and surgery ,  In November of 2011.pathology report shows poorly differentiated sarcoma, high grade.  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   # July 12th- Adria-Ola   # June 2018- Foundation One testing-Pending.  -----------------------------------------------------------------------------------------  # dec 2017- radiation Pneumonitis- started pre  3. 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 # NOV 2017- Left LE swelling ? 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   Patient complains of continued cough not improved;  getting worse. No wheezing. No hemoptysis.  Appetite is good. No weight loss. Denies any headaches. Continues to have shortness of breath with  exertion.  REVIEW OF SYSTEMS:  A complete 10 point review of system is done which is negative except mentioned above/history of present illness.   PAST MEDICAL HISTORY :  Past Medical History:  Diagnosis Date  . Cancer [facility]) 2007   Lung CA- Bilateral  . Coronary artery disease   . History of kidney stones   . Lung cancer ([place]) 07/16/2015  . Myocardial infarction [facility]) 2000   coronary stent  . [facility]) 2013   Right groin resection.   . Shortness of breath dyspnea   . Stroke [facility]) 2005    PAST SURGICAL HISTORY :   Past Surgical History:  Procedure Laterality Date  . ABDOMINAL AORTIC ANEURYSM REPAIR  10/28/2012   Dr Lucky Cowboy  . CAROTID ENDARTERECTOMY Right   . COLONOSCOPY  2013  . CYSTOSCOPY WITH LITHOLAPAXY N/A 04/23/2017   Procedure: CYSTOSCOPY WITH LITHOLAPAXY;  Surgeon: Nickie Retort, MD;  Location: [facility] ORS;  Service: Urology;  Laterality: N/A;  . ENDOBRONCHIAL ULTRASOUND N/A 08/08/2016   Procedure: ENDOBRONCHIAL ULTRASOUND;  Surgeon: Vilinda Boehringer, MD;  Location: [facility] ORS;  Service: Cardiopulmonary;  Laterality: N/A;  . EYE SURGERY Bilateral 2010  . HOLMIUM LASER APPLICATION N/A 01/27/8526   Procedure: HOLMIUM LASER APPLICATION;  Surgeon: Nickie Retort, MD;  Location: [facility] ORS;  Service: Urology;  Laterality: N/A;  . PORTA CATH INSERTION N/A 06/23/2017   Procedure: Glori Luis Cath Insertion;  Surgeon: Algernon Huxley, MD;  Location: [place] CV LAB;  Service: Cardiovascular;  Laterality: N/A;  . SKIN CANCER EXCISION Left   . THORACOTOMY Bilateral    Lobectomy left lower lobe/sarcoma resection.  FAMILY HISTORY :   Family History  Problem Relation Age of Onset  . Hematuria Neg Hx   . Prostate cancer Neg Hx    Family history of cancer/lung  SOCIAL HISTORY:  Currently not smoking. Social History  Substance Use Topics  . Smoking status: Former Smoker    Quit date: 07/31/2003  . Smokeless tobacco: Never Used  . Alcohol use No    ALLERGIES:  has No Known  Allergies.  MEDICATIONS:  No current facility-administered medications for this visit.    No current outpatient prescriptions on file.   Facility-Administered Medications Ordered in Other Visits  Medication Dose Route Frequency Provider Last Rate Last Dose  . acetaminophen (TYLENOL) tablet 650 mg  650 mg Oral Q6H PRN Epifanio Lesches, MD       Or  . acetaminophen (TYLENOL) suppository 650 mg  650 mg Rectal Q6H PRN Epifanio Lesches, MD      . chlorhexidine gluconate (MEDLINE KIT) (PERIDEX) 0.12 % solution 15 mL  15 mL Mouth Rinse BID Flora Lipps, MD      . DOPamine (INTROPIN) 3.2-5 MG/ML-% infusion           . DOXOrubicin (ADRIAMYCIN) chemo injection 136 mg  60 mg/m2 (Treatment Plan Recorded) Intravenous Once Charlaine Dalton R, MD      . enoxaparin (LOVENOX) injection 40 mg  40 mg Subcutaneous Q24H Epifanio Lesches, MD      . famotidine (PEPCID) IVPB 20 mg premix  20 mg Intravenous Q24H Flora Lipps, MD      . fentaNYL (SUBLIMAZE) injection 100 mcg  100 mcg Intravenous Q1H PRN Harvest Dark, MD   100 mcg at 06/28/2017 1641  . fentaNYL 10 mcg/ml infusion           . HYDROcodone-acetaminophen (NORCO/VICODIN) 5-325 MG per tablet 1-2 tablet  1-2 tablet Oral Q4H PRN Epifanio Lesches, MD      . hydrocortisone sodium succinate (SOLU-CORTEF) 100 MG injection 50 mg  50 mg Intravenous Q6H Kasa, Kurian, MD      . ipratropium-albuterol (DUONEB) 0.5-2.5 (3) MG/3ML nebulizer solution 3 mL  3 mL Nebulization Q6H Epifanio Lesches, MD      . Derrill Memo ON 07/04/2017] MEDLINE mouth rinse  15 mL Mouth Rinse QID Flora Lipps, MD      . methylPREDNISolone sodium succinate (SOLU-MEDROL) 125 mg/2 mL injection 60 mg  60 mg Intravenous Q24H Epifanio Lesches, MD      . ondansetron (ZOFRAN) tablet 4 mg  4 mg Oral Q6H PRN Epifanio Lesches, MD       Or  . ondansetron (ZOFRAN) injection 4 mg  4 mg Intravenous Q6H PRN Epifanio Lesches, MD      . pegfilgrastim (NEULASTA ONPRO KIT)  injection 6 mg  6 mg Subcutaneous Once Charlaine Dalton R, MD      . sodium bicarbonate 1 mEq/mL injection           . sodium bicarbonate 150 mEq in sterile water 1,000 mL infusion   Intravenous Continuous Flora Lipps, MD      . vasopressin (PITRESSIN) 40 Units in sodium chloride 0.9 % 250 mL (0.16 Units/mL) infusion  0.03 Units/min Intravenous Continuous Awilda Bill, NP 11.3 mL/hr at 07/19/2017 1558 0.03 Units/min at 07/19/2017 1558    PHYSICAL EXAMINATION: ECOG PERFORMANCE STATUS: 0 - Asymptomatic  BP (!) 152/86 (BP Location: Left Arm, Patient Position: Sitting)   Pulse 79   Temp 97.8 F (36.6 C) (Tympanic)   Resp 18   There were no vitals filed for  this visit.  GENERAL: Well-nourished well-developed; Alert, no distress and comfortable.   Accompanied by his wife/son.  EYES: no pallor or icterus OROPHARYNX: no thrush or ulceration; good dentition  NECK: supple, no masses felt LYMPH:  no palpable lymphadenopathy in the cervical, axillary or inguinal regions LUNGS: decreased breath sounds to auscultation on the left side compared to the right. No wheeze or crackles HEART/CVS: regular rate & rhythm and no murmurs; Bil LE- none.  ABDOMEN:abdomen soft, non-tender and normal bowel sounds Musculoskeletal:no cyanosis of digits and no clubbing  PSYCH: alert & oriented x 3 with fluent speech NEURO: no focal motor/sensory deficits SKIN:  No rash.  LABORATORY DATA:  I have reviewed the data as listed    Component Value Date/Time   NA 138 07/16/2017 1405   NA 138 01/03/2015 0942   K 4.3 07/08/2017 1405   K 4.8 01/03/2015 0942   CL 105 07/14/2017 1405   CL 100 01/03/2015 0942   CO2 18 (L) 07/14/2017 1405   CO2 30 01/03/2015 0942   GLUCOSE 184 (H) 06/30/2017 1405   GLUCOSE 117 (H) 01/03/2015 0942   BUN 11 07/09/2017 1405   BUN 14 01/03/2015 0942   CREATININE 1.03 06/29/2017 1405   CREATININE 1.05 01/03/2015 0942   CALCIUM 10.9 (H) 07/17/2017 1405   CALCIUM 9.2 01/03/2015 0942    PROT 6.1 (L) 07/02/2017 1405   PROT 7.8 01/03/2015 0942   ALBUMIN 3.0 (L) 06/26/2017 1405   ALBUMIN 4.0 01/03/2015 0942   AST 41 06/25/2017 1405   AST 24 01/03/2015 0942   ALT 18 07/05/2017 1405   ALT 34 01/03/2015 0942   ALKPHOS 87 07/05/2017 1405   ALKPHOS 73 01/03/2015 0942   BILITOT 0.8 07/11/2017 1405   BILITOT 0.6 01/03/2015 0942   GFRNONAA >60 07/17/2017 1405   GFRNONAA >60 01/03/2015 0942   GFRNONAA >60 06/29/2014 0947   GFRAA >60 07/20/2017 1405   GFRAA >60 01/03/2015 0942   GFRAA >60 06/29/2014 0947    No results found for: SPEP, UPEP  Lab Results  Component Value Date   WBC 19.6 (H) 07/15/2017   NEUTROABS 3.5 07/02/2017   HGB 18.4 (H) 07/09/2017   HCT 56.4 (H) 07/16/2017   MCV 85.2 07/01/2017   PLT 296 07/20/2017      Chemistry      Component Value Date/Time   NA 138 06/28/2017 1405   NA 138 01/03/2015 0942   K 4.3 07/19/2017 1405   K 4.8 01/03/2015 0942   CL 105 07/20/2017 1405   CL 100 01/03/2015 0942   CO2 18 (L) 07/19/2017 1405   CO2 30 01/03/2015 0942   BUN 11 06/29/2017 1405   BUN 14 01/03/2015 0942   CREATININE 1.03 06/29/2017 1405   CREATININE 1.05 01/03/2015 0942      Component Value Date/Time   CALCIUM 10.9 (H) 06/26/2017 1405   CALCIUM 9.2 01/03/2015 0942   ALKPHOS 87 06/24/2017 1405   ALKPHOS 73 01/03/2015 0942   AST 41 06/29/2017 1405   AST 24 01/03/2015 0942   ALT 18 07/06/2017 1405   ALT 34 01/03/2015 0942   BILITOT 0.8 07/11/2017 1405   BILITOT 0.6 01/03/2015 0942       RADIOGRAPHIC STUDIES: I have personally reviewed the radiological images as listed and agreed with the findings in the report. Dg Chest Port 1 View  Result Date: 07/02/2017 CLINICAL DATA:  Cardiac arrest.  Stent placement. EXAM: PORTABLE CHEST 1 VIEW COMPARISON:  06/16/2017 FINDINGS: Chronic left lung collapse due  to central obstructing mass. There is an endotracheal tube with tip at the clavicular heads. Porta catheter on the right with tip at the SVC  level. The right lung is clear. No evidence of pneumothorax. No cardiomegaly. IMPRESSION: 1. New endotracheal tube in unremarkable position. 2. Known complete collapse of the left lung from hilar mass. Electronically Signed   By: Monte Fantasia M.D.   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   # Discussed palliative treatment; incurable disease. Proceed with cycle #1 of doxorubicin-Latruvo. Discussed that the response rates are very modest 10-20%; at best stability to disease.  # potential side effects including but not limited to nausea vomiting diarrhea, hair loss neuropathy; drop in blood counts fatigue. Discussed the potential infusion reactions with Latruvo; and the cardiotoxicity with doxorubicin. 2-D echo shows normal ejection fraction 55-60%.  # awaiting on: Foundation 1 medicine testing; PDL 1 levels-pending.   # Addendum: Just a few minutes after starting olaratumumab- patient coded; CPR initiated; intubated/shocked. Given appropriate medications include steroids/ as per ACLS. Patient transferred to emergency room. Patient's son/family was informed; I met with them in the emergency room. They are aware of the overall serious prognosis.   # 40 minutes face-to-face with the patient discussing the above plan of care; more than 50% of time spent on prognosis/ natural history; counseling and coordination.   No orders of the defined types were placed in this encounter.  All questions were answered. The patient knows to call the clinic with any problems, questions or concerns.      Cammie Sickle, MD 07/05/2017 6:00 PM

## 2017-07-03 NOTE — Consult Note (Signed)
Name: Stephen [name] MRN: [id number] DOB: 02/09/1941     CONSULTATION DATE: 06/22/2017  REFERRING MD : Michel Santee  CHIEF COMPLAINT:  Acute cardiac arrest   HISTORY OF PRESENT ILLNESS:   76 year old white male with metastatic sarcoma to the lungs was getting chemotherapy when he subsequently developed acute cardiac arrest upon receiving his new chemotherapy drug, there is discussion of possible drug interaction and anaphylactic shock at this time  Patient underwent CPR for approximately 20 minutes after being intubated Patient had been electrically cardioverted several times during  CPR  Patient seen in ER on ventilator and on several vasopressors Patient critically ill with multiorgan failure Family at bedside updated and notified  CT chest films and chest x-rays reviewed over the last several studies Findings support a complete collapse of the left upper lobe in the setting of left lower lobe lobectomy This suggests progressive metastatic lung disease  PAST MEDICAL HISTORY :   has a past medical history of Cancer ([place]) (2007); Coronary artery disease; History of kidney stones; Lung cancer ([place]) (07/16/2015); Myocardial infarction ([place]) (2000); Sarcoma ([place]) (2013); Shortness of breath dyspnea; and Stroke ([place]) (2005).  has a past surgical history that includes Carotid endarterectomy (Right); Thoracotomy (Bilateral); Skin cancer excision (Left); Endobronchial ultrasound (N/A, 08/08/2016); Abdominal aortic aneurysm repair (10/28/2012); Eye surgery (Bilateral, 2010); Colonoscopy (2013); Cystoscopy with litholapaxy (N/A, 04/23/2017); Holmium laser application (N/A, 4/0/9811); and Porta Cath Insertion (N/A, 06/23/2017). Prior to Admission medications   Medication Sig Start Date End Date Taking? Authorizing Provider  albuterol (PROVENTIL HFA;VENTOLIN HFA) 108 (90 Base) MCG/ACT inhaler Inhale 2 puffs into the lungs every 6 (six) hours as needed for wheezing or shortness of breath. 09/23/16  Yes  Schaevitz, Randall An, MD  Ascorbic Acid (VITAMIN C) 100 MG tablet Take 100 mg by mouth daily.   Yes [provider]  aspirin EC 81 MG tablet Take 81 mg by mouth every evening.    Yes [provider]  Cholecalciferol (VITAMIN D) 2000 units tablet Take 2,000 Units by mouth daily.   Yes [provider]  Coenzyme Q10 (CO Q 10) 100 MG CAPS Take 100 mg by mouth daily.   Yes [provider]  diphenhydrAMINE (BENADRYL) 25 MG tablet Take 25 mg by mouth daily as needed for allergies.   Yes [provider]  DiphenhydrAMINE HCl, Sleep, (SLEEP AID) 50 MG CAPS Take 50 mg by mouth at bedtime.   Yes [provider]  ipratropium-albuterol (DUONEB) 0.5-2.5 (3) MG/3ML SOLN Use one vial via nebulizer every 6 (six) hours as needed. Patient taking differently: Use one vial via nebulizer every 6 (six) hours as needed for shortness of breath 04/16/17  Yes Nettie Wyffels, Maretta Bees, MD  lidocaine-prilocaine (EMLA) cream Apply 1 application topically as needed. 06/27/17  Yes Cammie Sickle, MD  Omega-3 Fatty Acids (FISH OIL) 1000 MG CAPS Take 1,000 mg by mouth daily.    Yes [provider]  ondansetron (ZOFRAN) 8 MG tablet Take 1 tablet (8 mg total) by mouth every 8 (eight) hours as needed for nausea or vomiting (start 3 days; after chemo). 06/27/17  Yes Cammie Sickle, MD  prochlorperazine (COMPAZINE) 10 MG tablet Take 1 tablet (10 mg total) by mouth every 6 (six) hours as needed for nausea or vomiting. 06/27/17  Yes Cammie Sickle, MD  tamsulosin (FLOMAX) 0.4 MG CAPS capsule Take 0.4 mg by mouth at bedtime.   Yes [provider]  Tiotropium Bromide Monohydrate (SPIRIVA RESPIMAT) 2.5 MCG/ACT AERS Inhale 2.5 mcg  into the lungs 2 (two) times daily. Patient taking differently: Inhale 2.5 mcg into the lungs 2 (two) times daily as needed (shortness of breath).  06/10/17  Yes Flora Lipps, MD  vitamin B-12 (CYANOCOBALAMIN) 1000 MCG tablet Take 1,000 mcg by  mouth daily.   Yes [provider]  AMBULATORY NON FORMULARY MEDICATION Medication Name: incentive spirometer Patient not taking: Reported on 07/08/2017 09/23/16   Vilinda Boehringer, MD   No Known Allergies  FAMILY HISTORY:  family history is not on file. SOCIAL HISTORY:  reports that he quit smoking about 13 years ago. He has never used smokeless tobacco. He reports that he does not drink alcohol or use drugs.  REVIEW OF SYSTEMS:   Unable to obtain due to critical illness  ALL OTHER ROS ARE NEGATIVE    VITAL SIGNS: Temp:  [96.7 F (35.9 C)-97.8 F (36.6 C)] 96.7 F (35.9 C) (07/12 1520) Pulse Rate:  [79-107] 107 (07/12 1430) Resp:  [18-22] 20 (07/12 1520) BP: (91-152)/(69-87) 125/87 (07/12 1520) SpO2:  [100 %] 100 % (07/12 1430) FiO2 (%):  [100 %] 100 % (07/12 1405) Weight:  [217 lb (98.4 kg)] 217 lb (98.4 kg) (07/12 1439)  Physical Examination:  GENERAL:critically ill appearing, +resp distress HEAD: Normocephalic, atraumatic.  EYES: Pupils equal, round, reactive to light.  No scleral icterus.  MOUTH: Moist mucosal membrane. NECK: Supple. No thyromegaly. No nodules. No JVD.  PULMONARY: +rhonchi, +wheezing CARDIOVASCULAR: S1 and S2. Regular rate and rhythm. No murmurs, rubs, or gallops.  GASTROINTESTINAL: Soft, nontender, -distended. No masses. Positive bowel sounds. No hepatosplenomegaly.  MUSCULOSKELETAL: No swelling, clubbing, or edema.  NEUROLOGIC: obtunded SKIN:intact,warm,dry        Recent Labs Lab 06/27/17 1435 07/22/2017 1050 06/25/2017 1405  NA 137 138 138  K 4.4 4.5 4.3  CL 103 103 105  CO2 25 27 18*  BUN 13 11 11   CREATININE 0.84 0.99 1.03  GLUCOSE 142* 126* 184*    Recent Labs Lab 06/27/17 1435 07/19/2017 1050 07/08/2017 1405  HGB 15.4 15.7 18.4*  HCT 46.3 46.2 56.4*  WBC 4.7 5.1 19.6*  PLT 272 274 296   Dg Chest Port 1 View  Result Date: 07/06/2017 CLINICAL DATA:  Cardiac arrest.  Stent placement. EXAM: PORTABLE CHEST 1 VIEW  COMPARISON:  06/16/2017 FINDINGS: Chronic left lung collapse due to central obstructing mass. There is an endotracheal tube with tip at the clavicular heads. Porta catheter on the right with tip at the SVC level. The right lung is clear. No evidence of pneumothorax. No cardiomegaly. IMPRESSION: 1. New endotracheal tube in unremarkable position. 2. Known complete collapse of the left lung from hilar mass. Electronically Signed   By: Monte Fantasia M.D.   On: 06/28/2017 14:37    ASSESSMENT / PLAN: 76 year old morbidly obese white male with metastatic sarcoma to the lungs with progressive lung disease in the setting of collapsed left upper lobe lung with acute cardiac arrest probably from acute anaphylactic reaction from new chemotherapy with prolonged cardiac arrest severe metabolic acidosis and cardiogenic shock  Overall prognosis is poor findings to suggest multiorgan failure Family updated and notified  After further assessment and evaluation in the setting of prolonged cardiac arrest with metastatic sarcoma patient is NOT a candidate for hypothermia protocol  #1 respiratory failure continue ventilatory support #2 cardiovascular collapse likely related to cardiogenic shock in setting of anaphylactic shock  Patient admitted to the ICU for further evaluation and assessment    Family are satisfied with Plan of action and management.  All questions answered  Corrin Parker, M.D.  [facility]  Medical Director [place] Director [facility] Cardio-Pulmonary Department

## 2017-07-03 NOTE — H&P (Signed)
[facility] NAME: Stephen [name]    MR#:  [id number]  DATE OF BIRTH:  [birth year]  DATE OF ADMISSION:  07/22/2017  PRIMARY CARE PHYSICIAN: Maryland Pink, MD   REQUESTING/REFERRING PHYSICIAN: Dr. Harvest Dark  CHIEF COMPLAINT: Cardiac arrest    Chief Complaint  Patient presents with  . Cardiac Arrest    HISTORY OF PRESENT ILLNESS:  Stephen [name]  is a 76 y.o. male with a known history of Recurrent lung cancer started on new chemotherapy today  With latruvo and doxorubicin, had cardiac arrest with   PEA following first one drop off chemotherapy with new medicine. Patient was given IV Solu-Medrol 125 mg, CODE BLUE was called, patient had been intubated in the emergency room, defibrillated multiple times, patient had pulse and started on Neo-Synephrine drip in the emergency room, patient received multiple doses of epinephrine, patient lost pulses about 4 times as per discussion with ER physician and also the nurse was at the bedside in the patient's room.\Denies a physician, started on vasopressin, bicarb DRIP.  PAST MEDICAL HISTORY:   Past Medical History:  Diagnosis Date  . Cancer [facility]) 2007   Lung CA- Bilateral  . Coronary artery disease   . History of kidney stones   . Lung cancer ([place]) 07/16/2015  . Myocardial infarction [facility]) 2000   coronary stent  . Sarcoma [facility]) 2013   Right groin resection.   . Shortness of breath dyspnea   . Stroke [facility]) 2005    PAST SURGICAL HISTOIRY:   Past Surgical History:  Procedure Laterality Date  . ABDOMINAL AORTIC ANEURYSM REPAIR  10/28/2012   Dr Lucky Cowboy  . CAROTID ENDARTERECTOMY Right   . COLONOSCOPY  2013  . CYSTOSCOPY WITH LITHOLAPAXY N/A 04/23/2017   Procedure: CYSTOSCOPY WITH LITHOLAPAXY;  Surgeon: Nickie Retort, MD;  Location: [facility] ORS;  Service: Urology;  Laterality: N/A;  . ENDOBRONCHIAL ULTRASOUND N/A 08/08/2016   Procedure: ENDOBRONCHIAL ULTRASOUND;  Surgeon: Vilinda Boehringer,  MD;  Location: [facility] ORS;  Service: Cardiopulmonary;  Laterality: N/A;  . EYE SURGERY Bilateral 2010  . HOLMIUM LASER APPLICATION N/A 01/29/9484   Procedure: HOLMIUM LASER APPLICATION;  Surgeon: Nickie Retort, MD;  Location: [facility] ORS;  Service: Urology;  Laterality: N/A;  . PORTA CATH INSERTION N/A 06/23/2017   Procedure: Glori Luis Cath Insertion;  Surgeon: Algernon Huxley, MD;  Location: [place] CV LAB;  Service: Cardiovascular;  Laterality: N/A;  . SKIN CANCER EXCISION Left   . THORACOTOMY Bilateral     SOCIAL HISTORY:   Social History  Substance Use Topics  . Smoking status: Former Smoker    Quit date: 07/31/2003  . Smokeless tobacco: Never Used  . Alcohol use No    FAMILY HISTORY:   Family History  Problem Relation Age of Onset  . Hematuria Neg Hx   . Prostate cancer Neg Hx     DRUG ALLERGIES:  No Known Allergies  REVIEW OF SYSTEMS:  INTUBATED, sedated.  MEDICATIONS AT HOME:   Prior to Admission medications   Medication Sig Start Date End Date Taking? Authorizing Provider  albuterol (PROVENTIL HFA;VENTOLIN HFA) 108 (90 Base) MCG/ACT inhaler Inhale 2 puffs into the lungs every 6 (six) hours as needed for wheezing or shortness of breath. 09/23/16  Yes Schaevitz, Randall An, MD  Ascorbic Acid (VITAMIN C) 100 MG tablet Take 100 mg by mouth daily.   Yes [provider]  aspirin EC 81 MG tablet Take 81 mg by mouth every  evening.    Yes [provider]  Cholecalciferol (VITAMIN D) 2000 units tablet Take 2,000 Units by mouth daily.   Yes [provider]  Coenzyme Q10 (CO Q 10) 100 MG CAPS Take 100 mg by mouth daily.   Yes [provider]  diphenhydrAMINE (BENADRYL) 25 MG tablet Take 25 mg by mouth daily as needed for allergies.   Yes [provider]  DiphenhydrAMINE HCl, Sleep, (SLEEP AID) 50 MG CAPS Take 50 mg by mouth at bedtime.   Yes [provider]  ipratropium-albuterol (DUONEB) 0.5-2.5 (3) MG/3ML SOLN Use one vial  via nebulizer every 6 (six) hours as needed. Patient taking differently: Use one vial via nebulizer every 6 (six) hours as needed for shortness of breath 04/16/17  Yes Kasa, Maretta Bees, MD  lidocaine-prilocaine (EMLA) cream Apply 1 application topically as needed. 06/27/17  Yes Cammie Sickle, MD  Omega-3 Fatty Acids (FISH OIL) 1000 MG CAPS Take 1,000 mg by mouth daily.    Yes [provider]  ondansetron (ZOFRAN) 8 MG tablet Take 1 tablet (8 mg total) by mouth every 8 (eight) hours as needed for nausea or vomiting (start 3 days; after chemo). 06/27/17  Yes Cammie Sickle, MD  prochlorperazine (COMPAZINE) 10 MG tablet Take 1 tablet (10 mg total) by mouth every 6 (six) hours as needed for nausea or vomiting. 06/27/17  Yes Cammie Sickle, MD  tamsulosin (FLOMAX) 0.4 MG CAPS capsule Take 0.4 mg by mouth at bedtime.   Yes [provider]  Tiotropium Bromide Monohydrate (SPIRIVA RESPIMAT) 2.5 MCG/ACT AERS Inhale 2.5 mcg into the lungs 2 (two) times daily. Patient taking differently: Inhale 2.5 mcg into the lungs 2 (two) times daily as needed (shortness of breath).  06/10/17  Yes Flora Lipps, MD  vitamin B-12 (CYANOCOBALAMIN) 1000 MCG tablet Take 1,000 mcg by mouth daily.   Yes [provider]  AMBULATORY NON FORMULARY MEDICATION Medication Name: incentive spirometer Patient not taking: Reported on 07/20/2017 09/23/16   Vilinda Boehringer, MD      VITAL SIGNS:  Blood pressure (!) 37/27, pulse (!) 107, temperature (!) 96.4 F (35.8 C), resp. rate (!) 28, height 6\' 2"  (1.88 m), weight 98.4 kg (217 lb), SpO2 100 %.  PHYSICAL EXAMINATION:  GENERAL:  76 y.o.-year-old patient lying in the bed with no acute distress.  EYES: Pupils equal, round, reactive to light and accommodation. No scleral icterus. Extraocular muscles intact.  HEENT: Head atraumatic, normocephalic. Oropharynx and nasopharynx clear.  NECK:  Supple, no jugular venous distention. No thyroid enlargement, no  tenderness.  LUNGS: Normal breath sounds bilaterally, no wheezing, rales,rhonchi or crepitation. No use of accessory muscles of respiration.  CARDIOVASCULAR: S1, S2 normal. No murmurs, rubs, or gallops.  ABDOMEN: Soft, nontender, nondistended. Bowel sounds present. No organomegaly or mass.  EXTREMITIES: No pedal edema, cyanosis, or clubbing.  NEUROLOGIC: Cranial nerves II through XII are intact. Muscle strength 5/5 in all extremities. Sensation intact. Gait not checked.  PSYCHIATRIC: The patient is alert and oriented x 3.  SKIN: No obvious rash, lesion, or ulcer.   LABORATORY PANEL:   CBC  Recent Labs Lab 07/22/2017 1405  WBC 19.6*  HGB 18.4*  HCT 56.4*  PLT 296   ------------------------------------------------------------------------------------------------------------------  Chemistries   Recent Labs Lab 07/12/2017 1405  NA 138  K 4.3  CL 105  CO2 18*  GLUCOSE 184*  BUN 11  CREATININE 1.03  CALCIUM 10.9*  AST 41  ALT 18  ALKPHOS 87  BILITOT 0.8   ------------------------------------------------------------------------------------------------------------------  Cardiac Enzymes  Recent Labs Lab 07/09/2017 1405  TROPONINI <0.03   ------------------------------------------------------------------------------------------------------------------  RADIOLOGY:  Dg Chest Port 1 View  Result Date: 07/17/2017 CLINICAL DATA:  Cardiac arrest.  Stent placement. EXAM: PORTABLE CHEST 1 VIEW COMPARISON:  06/16/2017 FINDINGS: Chronic left lung collapse due to central obstructing mass. There is an endotracheal tube with tip at the clavicular heads. Porta catheter on the right with tip at the SVC level. The right lung is clear. No evidence of pneumothorax. No cardiomegaly. IMPRESSION: 1. New endotracheal tube in unremarkable position. 2. Known complete collapse of the left lung from hilar mass. Electronically Signed   By: Monte Fantasia M.D.   On: 07/02/2017 14:37    EKG:    Orders placed or performed during the hospital encounter of 07/14/2017  . ED EKG  . ED EKG   Sinus tachycardia 1 41 bpm with wide QRS, nonspecific ST-T changes.  IMPRESSION AND PLAN:   #1 cardiopulmonary arrest with an anaphylactic shock due to new chemotherapy with LATRUVO; admitted to intensive care unit, continue full vent support, started on vasopressin drip, bicarbonate drip, IV fluids, IV steroids, ,NPO,bronchodilators, for now he is full code, discussed with wife. 2. Recurrent undifferentiated pleomorphic sarcoma ; prognosis is really poor, treatment is only palliative as per discussion with Dr. Rogue Bussing.  with a history of COPD Number first of coronary artery disease #6 history of hyperlipidemia next  #7 history of PVD Prognosis really poor,advise DNR. Spoke with Dr.Brahmanday,10-15%chance of anaphylactic reaction w ith new chemo and it happened to him. All the records are reviewed and case discussed with ED provider. Management plans discussed with the patient, family and they are in agreement.  CODE STATUS: full  TOTAL TIME TAKING CARE OF THIS PATIENT: 55 minutes.    Epifanio Lesches M.D on 07/04/2017 at 3:54 PM  Between 7am to 6pm - Pager - [phone]  After 6pm go to [email or web address] - password [email or web address] [facility]  Office  [phone]  CC: Primary care physician; Maryland Pink, MD  Note: This dictation was prepared with Dragon dictation along with smaller phrase technology. Any transcriptional errors that result from this process are unintentional.

## 2017-07-03 NOTE — Progress Notes (Signed)
13:32 - two minutes after start of [place] patient had reaction to drug, immediately stopped Lartruvo, increased NS, called Dr. Rogue Bussing and called a Code simultaneously, per Dr. Rogue Bussing patient received Benadryl 25 mg IV and Solu-Cortef 100 mg IV.  Code Team arrived and took over patient's care.  Patient transferred to the ED with Code Team.

## 2017-07-03 NOTE — ED Notes (Signed)
No pulses palpated. Chest compressions began. MD at bedside.

## 2017-07-03 NOTE — Progress Notes (Signed)
Unable to doppler pedal pulses.  Marda Stalker aware.

## 2017-07-03 NOTE — Progress Notes (Signed)
[place] responded to a Code Blue in [facility] Infusion. Medical team was present and working on the Pt on my arrival. [facility] asked for [facility] assistance in locating family. [place] attended to several Infusion Pt who were upset at the work of the Medical Team. Pt was stabilized enough to transfer to ED. Family was escorted to [facility] waiting area where the MD informed them of the Pt condition. Pt was stabilized and escorted to room ED-26. [place] provided the ministry of prayer and presence. Seeing they were in a stable situation, I excused myself. CH is available for follow up as needed.    07/15/2017 1500  Clinical Encounter Type  Visited With Patient;Patient and family together;Health care provider  Visit Type Initial;Spiritual support;Code;ED  Referral From Nurse  Consult/Referral To Chaplain  Spiritual Encounters  Spiritual Needs Prayer;Emotional;Grief support

## 2017-07-03 NOTE — ED Notes (Signed)
Attempted to scan new bag of norepinephrine, unable to scan.  Spoke with Dr. Mortimer Fries in ICU and VO given to continue norepinephrine per previous order.  Medication continued at 21mcg/min as it has been running.

## 2017-07-03 NOTE — ED Notes (Signed)
Regained pulses

## 2017-07-03 NOTE — Assessment & Plan Note (Addendum)
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   # Discussed palliative treatment; incurable disease. Proceed with cycle #1 of doxorubicin-Latruvo. Discussed that the response rates are very modest 10-20%; at best stability to disease.  # potential side effects including but not limited to nausea vomiting diarrhea, hair loss neuropathy; drop in blood counts fatigue. Discussed the potential infusion reactions with Latruvo; and the cardiotoxicity with doxorubicin. 2-D echo shows normal ejection fraction 55-60%.  # awaiting on: Foundation 1 medicine testing; PDL 1 levels-pending.   # Addendum: Just a few minutes after starting olaratumumab- patient coded; CPR initiated; intubated/shocked. Given appropriate medications include steroids/ as per ACLS. Patient transferred to emergency room. Patient's son/family was informed; I met with them in the emergency room. They are aware of the overall serious prognosis.   # 40 minutes face-to-face with the patient discussing the above plan of care; more than 50% of time spent on prognosis/ natural history; counseling and coordination.

## 2017-07-03 NOTE — ED Triage Notes (Signed)
Patient from cancer center. Patient having outpatient treatment (first treatment of Lartruvo) when he lost pulses. Staff began CPR with return of pulses after approximately 20 minutes. Upon arrival to ED, patient intubated with pulses. Escorted by EDP, nursing supervisor and ED nursing staff. Patient being bagged by RT.

## 2017-07-03 NOTE — Procedures (Signed)
Central Venous Catheter Insertion Procedure Note KINSER FELLMAN [id number] July 09, 1941  Procedure: Insertion of Central Venous Catheter Indications: Assessment of intravascular volume, Drug and/or fluid administration and Frequent blood sampling  Procedure Details Consent: Emergent requiring multiple vasopressors unable to locate family Time Out: Verified patient identification, verified procedure, site/side was marked, verified correct patient position, special equipment/implants available, medications/allergies/relevent history reviewed, required imaging and test results available.  Performed  Maximum sterile technique was used including antiseptics, cap, gloves, gown, hand hygiene, mask and sheet. Skin prep: Chlorhexidine; local anesthetic administered A antimicrobial bonded/coated triple lumen catheter was placed in the left internal jugular vein using the Seldinger technique.  Evaluation Blood flow good Complications: No apparent complications Patient did tolerate procedure well. Chest X-ray ordered to verify placement.  CXR: normal.  Left internal jugular central line placed utilizing ultrasound no complications noted during or following procedure.  Marda Stalker, [place] Pager [phone] (please enter 7 digits) [place] Pager [phone] (please enter 7 digits)  Merton Border, MD PCCM service Mobile [phone] Pager [phone] 07/04/2017 3:50 PM

## 2017-07-04 ENCOUNTER — Other Ambulatory Visit: Payer: Self-pay

## 2017-07-04 ENCOUNTER — Encounter: Payer: Self-pay | Admitting: Internal Medicine

## 2017-07-04 DIAGNOSIS — J9600 Acute respiratory failure, unspecified whether with hypoxia or hypercapnia: Secondary | ICD-10-CM

## 2017-07-04 DIAGNOSIS — I2510 Atherosclerotic heart disease of native coronary artery without angina pectoris: Secondary | ICD-10-CM

## 2017-07-04 DIAGNOSIS — Z8673 Personal history of transient ischemic attack (TIA), and cerebral infarction without residual deficits: Secondary | ICD-10-CM

## 2017-07-04 DIAGNOSIS — R0602 Shortness of breath: Secondary | ICD-10-CM

## 2017-07-04 DIAGNOSIS — R579 Shock, unspecified: Secondary | ICD-10-CM

## 2017-07-04 DIAGNOSIS — R079 Chest pain, unspecified: Secondary | ICD-10-CM

## 2017-07-04 DIAGNOSIS — I252 Old myocardial infarction: Secondary | ICD-10-CM

## 2017-07-04 DIAGNOSIS — C7800 Secondary malignant neoplasm of unspecified lung: Secondary | ICD-10-CM

## 2017-07-04 DIAGNOSIS — Z79899 Other long term (current) drug therapy: Secondary | ICD-10-CM

## 2017-07-04 DIAGNOSIS — Z87442 Personal history of urinary calculi: Secondary | ICD-10-CM

## 2017-07-04 DIAGNOSIS — Z87891 Personal history of nicotine dependence: Secondary | ICD-10-CM

## 2017-07-04 DIAGNOSIS — C495 Malignant neoplasm of connective and soft tissue of pelvis: Secondary | ICD-10-CM

## 2017-07-04 LAB — BASIC METABOLIC PANEL
ANION GAP: 10 (ref 5–15)
ANION GAP: 9 (ref 5–15)
BUN: 20 mg/dL (ref 6–20)
BUN: 30 mg/dL — ABNORMAL HIGH (ref 6–20)
CALCIUM: 8 mg/dL — AB (ref 8.9–10.3)
CHLORIDE: 103 mmol/L (ref 101–111)
CO2: 23 mmol/L (ref 22–32)
CO2: 26 mmol/L (ref 22–32)
CREATININE: 2.06 mg/dL — AB (ref 0.61–1.24)
Calcium: 8.1 mg/dL — ABNORMAL LOW (ref 8.9–10.3)
Chloride: 102 mmol/L (ref 101–111)
Creatinine, Ser: 1.9 mg/dL — ABNORMAL HIGH (ref 0.61–1.24)
GFR calc Af Amer: 38 mL/min — ABNORMAL LOW (ref >60)
GFR, EST AFRICAN AMERICAN: 35 mL/min — AB (ref >60)
GFR, EST NON AFRICAN AMERICAN: 33 mL/min — AB (ref >60)
GFR, EST NON AFRICAN AMERICAN: 30 mL/min — AB (ref >60)
GLUCOSE: 213 mg/dL — AB (ref 65–99)
Glucose, Bld: 208 mg/dL — ABNORMAL HIGH (ref 65–99)
POTASSIUM: 5.4 mmol/L — AB (ref 3.5–5.1)
Potassium: 5 mmol/L (ref 3.5–5.1)
SODIUM: 137 mmol/L (ref 135–145)
Sodium: 136 mmol/L (ref 135–145)

## 2017-07-04 LAB — CBC
HEMATOCRIT: 47.7 % (ref 40.0–52.0)
HEMOGLOBIN: 15.7 g/dL (ref 13.0–18.0)
MCH: 27.6 pg (ref 26.0–34.0)
MCHC: 32.9 g/dL (ref 32.0–36.0)
MCV: 83.7 fL (ref 80.0–100.0)
Platelets: 281 10*3/uL (ref 150–440)
RBC: 5.7 MIL/uL (ref 4.40–5.90)
RDW: 14.1 % (ref 11.5–14.5)
WBC: 22.8 10*3/uL — AB (ref 3.8–10.6)

## 2017-07-04 LAB — GLUCOSE, CAPILLARY
GLUCOSE-CAPILLARY: 208 mg/dL — AB (ref 65–99)
GLUCOSE-CAPILLARY: 182 mg/dL — AB (ref 65–99)
Glucose-Capillary: 200 mg/dL — ABNORMAL HIGH (ref 65–99)
Glucose-Capillary: 203 mg/dL — ABNORMAL HIGH (ref 65–99)
Glucose-Capillary: 215 mg/dL — ABNORMAL HIGH (ref 65–99)
Glucose-Capillary: 223 mg/dL — ABNORMAL HIGH (ref 65–99)

## 2017-07-04 LAB — PROTIME-INR
INR: 1.14
PROTHROMBIN TIME: 14.7 s (ref 11.4–15.2)

## 2017-07-04 LAB — PHOSPHORUS: PHOSPHORUS: 5.8 mg/dL — AB (ref 2.5–4.6)

## 2017-07-04 LAB — APTT: aPTT: 26 seconds (ref 24–36)

## 2017-07-04 LAB — MAGNESIUM: Magnesium: 1.6 mg/dL — ABNORMAL LOW (ref 1.7–2.4)

## 2017-07-04 LAB — TROPONIN I: TROPONIN I: 4.68 ng/mL — AB (ref <0.03)

## 2017-07-04 MED ORDER — INSULIN ASPART 100 UNIT/ML ~~LOC~~ SOLN
0.0000 [IU] | SUBCUTANEOUS | Status: DC
  Administered 2017-07-04: 3 [IU] via SUBCUTANEOUS
  Administered 2017-07-05: 5 [IU] via SUBCUTANEOUS
  Administered 2017-07-05: 3 [IU] via SUBCUTANEOUS
  Administered 2017-07-05: 5 [IU] via SUBCUTANEOUS
  Filled 2017-07-04 (×5): qty 1

## 2017-07-04 MED ORDER — HEPARIN BOLUS VIA INFUSION
4000.0000 [IU] | Freq: Once | INTRAVENOUS | Status: AC
  Administered 2017-07-04: 4000 [IU] via INTRAVENOUS
  Filled 2017-07-04: qty 4000

## 2017-07-04 MED ORDER — HEPARIN (PORCINE) IN NACL 100-0.45 UNIT/ML-% IJ SOLN
1150.0000 [IU]/h | INTRAMUSCULAR | Status: DC
  Administered 2017-07-04: 1150 [IU]/h via INTRAVENOUS
  Filled 2017-07-04: qty 250

## 2017-07-04 MED ORDER — LACTATED RINGERS IV SOLN
INTRAVENOUS | Status: DC
  Administered 2017-07-04 – 2017-07-05 (×2): via INTRAVENOUS

## 2017-07-04 MED ORDER — BLISTEX MEDICATED EX OINT
TOPICAL_OINTMENT | CUTANEOUS | Status: DC | PRN
  Filled 2017-07-04: qty 6.3

## 2017-07-04 MED ORDER — FENTANYL CITRATE (PF) 100 MCG/2ML IJ SOLN
12.5000 ug | INTRAMUSCULAR | Status: DC | PRN
  Administered 2017-07-04: 25 ug via INTRAVENOUS
  Filled 2017-07-04: qty 2

## 2017-07-04 MED ORDER — NITROGLYCERIN 0.4 MG SL SUBL
0.4000 mg | SUBLINGUAL_TABLET | SUBLINGUAL | Status: DC | PRN
  Administered 2017-07-04: 0.4 mg via SUBLINGUAL
  Filled 2017-07-04: qty 1

## 2017-07-04 MED ORDER — INSULIN ASPART 100 UNIT/ML ~~LOC~~ SOLN
0.0000 [IU] | SUBCUTANEOUS | Status: DC
  Administered 2017-07-04: 2 [IU] via SUBCUTANEOUS
  Administered 2017-07-04: 3 [IU] via SUBCUTANEOUS
  Filled 2017-07-04 (×2): qty 1

## 2017-07-04 MED ORDER — MAGNESIUM SULFATE 2 GM/50ML IV SOLN
2.0000 g | Freq: Once | INTRAVENOUS | Status: AC
Start: 2017-07-04 — End: 2017-07-04
  Administered 2017-07-04: 2 g via INTRAVENOUS
  Filled 2017-07-04: qty 50

## 2017-07-04 MED ORDER — DEXMEDETOMIDINE HCL IN NACL 400 MCG/100ML IV SOLN
0.0000 ug/kg/h | INTRAVENOUS | Status: DC
  Administered 2017-07-04: 0.4 ug/kg/h via INTRAVENOUS
  Filled 2017-07-04: qty 100

## 2017-07-04 MED ORDER — MORPHINE SULFATE (PF) 2 MG/ML IV SOLN
2.0000 mg | INTRAVENOUS | Status: DC | PRN
  Administered 2017-07-05: 4 mg via INTRAVENOUS
  Administered 2017-07-05 – 2017-07-06 (×3): 2 mg via INTRAVENOUS
  Administered 2017-07-06: 4 mg via INTRAVENOUS
  Filled 2017-07-04 (×3): qty 2
  Filled 2017-07-04 (×3): qty 1

## 2017-07-04 NOTE — Progress Notes (Signed)
CH made a follow up visit. Pt is intubated and appears awake. Family is bedside. Family is very concerned but hopeful. [place] provided the ministry of presence and prayer. [place] will follow up on next rounding.    07/04/17 1000  Clinical Encounter Type  Visited With Patient;Patient and family together  Visit Type Follow-up;Spiritual support;Critical Care  Consult/Referral To Chaplain  Spiritual Encounters  Spiritual Needs Prayer;Emotional

## 2017-07-04 NOTE — Progress Notes (Signed)
   Name: Stephen [name] MRN: [id number] DOB: Dec 09, 1941     CONSULTATION DATE: 07/14/2017  REFERRING MD : Michel Santee  CHIEF COMPLAINT:  Acute cardiac arrest   PT PROFILE:   15 M with metastatic sarcoma to the lungs was getting chemotherapy when he subsequently developed acute cardiac arrest upon receiving his new chemotherapy drug due to probable anaphylaxis. Patient underwent CPR for approximately 20 minutes after being intubated. Patient had been electrically cardioverted several times during  CPR    SUBJ Passed. + F/C. Weaning vasopressors. Extubated and tolerating well post extubation   VITAL SIGNS: Temp:  [96.1 F (35.6 C)-98.4 F (36.9 C)] 97.6 F (36.4 C) (07/13 1300) Pulse Rate:  [69-109] 81 (07/13 1445) Resp:  [8-21] 11 (07/13 1445) BP: (74-161)/(40-92) 101/60 (07/13 1445) SpO2:  [92 %-100 %] 94 % (07/13 1445) FiO2 (%):  [40 %-60 %] 40 % (07/13 0900)  Physical Examination:  GENERAL: RASS -1, NAD HEAD: NCAT.  EYES: Sclerae white .Marland Kitchen  PULMONARY: clear CARDIOVASCULAR: reg, no M.  GASTROINTESTINAL: Soft, nontender, +BS.  EXT: no edema, warm.  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

## 2017-07-04 NOTE — Plan of Care (Signed)
Heparin drip initiated.

## 2017-07-04 NOTE — Progress Notes (Addendum)
   07/04/17 1541  Vitals  Pulse Rate 92  ECG Heart Rate 97  Resp 18  Oxygen Therapy  SpO2 (!) 56 %     Nurse went into room to find out why pt sats were low. Found pt with nasal canula on the hair, he was pulling it off the oxygen sensor off the forehead, nurse went around to adjust, pt then started to pull the dressing for the left IJ, when nurse stopped the pt but as she assessed, the IJ was already out, he had pulled the line probably while taking off the oxygen sensor and nasal canula, the only thing holding it In place were the sutures but it was out, site cleansed and covered, iv LR readjusted to PIV, mitts placed back on pt, catheter tip for IJ intact

## 2017-07-04 NOTE — Progress Notes (Signed)
ANTICOAGULATION CONSULT NOTE - Initial Consult  Pharmacy Consult for heparin drip  Indication: chest pain/ACS  No Known Allergies  Patient Measurements: Height: 6\' 2"  (188 cm) Weight: 217 lb (98.4 kg) IBW/kg (Calculated) : 82.2  Vital Signs: Temp: 97.6 F (36.4 C) (07/13 1300) Temp Source: Oral (07/13 1300) BP: 131/55 (07/13 1815) Pulse Rate: 67 (07/13 1815)  Labs:  Recent Labs  06/26/2017 1050 07/22/2017 1405 07/04/17 0500 07/04/17 1454 07/04/17 1733  HGB 15.7 18.4* 15.7  --   --   HCT 46.2 56.4* 47.7  --   --   PLT 274 296 281  --   --   CREATININE 0.99 1.03 1.90* 2.06*  --   TROPONINI  --  <0.03  --   --  4.68*    Estimated Creatinine Clearance: 36 mL/min (A) (by C-G formula based on SCr of 2.06 mg/dL (H)).   Medical History: Past Medical History:  Diagnosis Date  . Cancer [facility]) 2007   Lung CA- Bilateral  . Coronary artery disease   . History of kidney stones   . Lung cancer ([place]) 07/16/2015  . Myocardial infarction [facility]) 2000   coronary stent  . Sarcoma [facility]) 2013   Right groin resection.   . Shortness of breath dyspnea   . Stroke [facility]) 2005    Assessment: Pharmacy consulted for heparin drip dosing and monitoring for ACS in 76 yo patient admitted to ICU. Troponin 4.68.   Goal of Therapy:  Heparin level 0.3-0.7 units/ml Monitor platelets by anticoagulation protocol: Yes   Plan:  Baseline labs ordered Give 4000 units bolus x 1 Start heparin infusion at 1150 units/hr Check anti-Xa level in 8 hours and daily while on heparin Continue to monitor H&H and platelets  Pernell Dupre, PharmD, BCPS Clinical Pharmacist 07/04/2017 6:40 PM

## 2017-07-04 NOTE — Progress Notes (Signed)
[facility] [place] NAME: Stephen [name]    MR#:  [id number]  DATE OF BIRTH:  [birth year]  SUBJECTIVE:  CHIEF COMPLAINT:   Chief Complaint  Patient presents with  . Cardiac Arrest  just extubated earlier today. Resting REVIEW OF SYSTEMS:  Review of Systems  Constitutional: Negative for chills, fever and weight loss.  HENT: Negative for nosebleeds and sore throat.   Eyes: Negative for blurred vision.  Respiratory: Negative for cough, shortness of breath and wheezing.   Cardiovascular: Negative for chest pain, orthopnea, leg swelling and PND.  Gastrointestinal: Negative for abdominal pain, constipation, diarrhea, heartburn, nausea and vomiting.  Genitourinary: Negative for dysuria and urgency.  Musculoskeletal: Negative for back pain.  Skin: Negative for rash.  Neurological: Negative for dizziness, speech change, focal weakness and headaches.  Endo/Heme/Allergies: Does not bruise/bleed easily.  Psychiatric/Behavioral: Negative for depression.    DRUG ALLERGIES:  No Known Allergies VITALS:  Blood pressure 101/60, pulse 81, temperature 97.6 F (36.4 C), temperature source Oral, resp. rate 11, height 6\' 2"  (1.88 m), weight 98.4 kg (217 lb), SpO2 94 %. PHYSICAL EXAMINATION:  Physical Exam  Constitutional: He is oriented to person, place, and time and well-developed, well-nourished, and in no distress.  HENT:  Head: Normocephalic and atraumatic.  Eyes: Pupils are equal, round, and reactive to light. Conjunctivae and EOM are normal.  Neck: Normal range of motion. Neck supple. No tracheal deviation present. No thyromegaly present.  Cardiovascular: Normal rate, regular rhythm and normal heart sounds.   Pulmonary/Chest: Effort normal and breath sounds normal. No respiratory distress. He has no wheezes. He exhibits no tenderness.  Abdominal: Soft. Bowel sounds are normal. He exhibits no distension. There is no tenderness.  Musculoskeletal:  Normal range of motion.  Neurological: He is alert and oriented to person, place, and time. No cranial nerve deficit.  Skin: Skin is warm and dry. No rash noted.  Psychiatric: Mood and affect normal.   LABORATORY PANEL:  Male CBC  Recent Labs Lab 07/04/17 0500  WBC 22.8*  HGB 15.7  HCT 47.7  PLT 281   ------------------------------------------------------------------------------------------------------------------ Chemistries   Recent Labs Lab 07/05/2017 1405 07/04/17 0500  NA 138 136  K 4.3 5.4*  CL 105 103  CO2 18* 23  GLUCOSE 184* 213*  BUN 11 20  CREATININE 1.03 1.90*  CALCIUM 10.9* 8.1*  AST 41  --   ALT 18  --   ALKPHOS 87  --   BILITOT 0.8  --    RADIOLOGY:  Dg Chest Port 1 View  Result Date: 06/26/2017 CLINICAL DATA:  Central line placement. On ventilator. Lung carcinoma. EXAM: PORTABLE CHEST 1 VIEW COMPARISON:  06/27/2017 FINDINGS: A new left jugular central venous catheter is seen with tip overlying the proximal SVC. A new nasogastric tube is also seen entering the stomach. Endotracheal tube and right jugular central venous catheter remain in appropriate position. Complete opacification of left hemithorax is again seen. Left lung remains clear. No pneumothorax visualized. IMPRESSION: New left jugular central venous catheter and enteric tube is in appropriate position. No pneumothorax visualized. Stable complete opacification of left hemithorax. Electronically Signed   By: Earle Gell M.D.   On: 07/02/2017 18:34   ASSESSMENT AND PLAN:  76 year old morbidly obese white male with metastatic sarcoma to the lungs with progressive lung disease in the setting of collapsed left upper lobe lung with acute cardiac arrest probably from acute anaphylactic reaction from new chemotherapy with prolonged cardiac arrest severe  metabolic acidosis and cardiogenic shock  * Acute respiratory failure: required short ventilatory support - now extubated, on [place] O2  * Cardiopulmonary  arrest with an anaphylactic shock due to new chemotherapy with LATRUVO;  - wean off pressors - PCCM following  * ARF - prerenal vs ATN - continue IVFs, monitor creat - avoid nephrotoxins  * Recurrent undifferentiated pleomorphic sarcoma - follows with Dr. Rogue Bussing.    All the records are reviewed and case discussed with Care Management/Social Worker. Management plans discussed with the patient, nursing, intensivist and they are in agreement.  CODE STATUS: Full Code  TOTAL TIME TAKING CARE OF THIS PATIENT: 15 minutes.   More than 50% of the time was spent in counseling/coordination of care: YES  POSSIBLE D/C IN 2-3 DAYS, DEPENDING ON CLINICAL CONDITION.   Max Sane M.D on 07/04/2017 at 2:58 PM  Between 7am to 6pm - Pager - [phone]  After 6pm go to [email or web address] - Proofreader  [facility]  Office  [phone]  CC: Primary care physician; Maryland Pink, MD  Note: This dictation was prepared with Dragon dictation along with smaller phrase technology. Any transcriptional errors that result from this process are unintentional.

## 2017-07-04 NOTE — Plan of Care (Addendum)
Results for FINLEY, CHEVEZ (MRN [id number]) as of 07/04/2017 18:15  Ref. Range 07/04/2017 17:33  Troponin I Latest Ref Range: <0.03 ng/mL 4.68 [facility])    ICU NP aware.  Pt complained of pain all over and left arm He was diaphoretic Cont on levophed  Heparin drip initiated

## 2017-07-04 NOTE — Progress Notes (Signed)
07/04/17 1206  Charting Type  Charting Type Reassessment  Orders Chart Check (once per shift) Completed  Neurological  Neuro (WDL) X  Level of Consciousness Alert  Orientation Level Disoriented to time  Cognition Appropriate for developmental age;Follows commands  Speech Delayed responses  Pupil Assessment  Yes  R Pupil Size (mm) 2  R Pupil Shape Round  R Pupil Reaction Brisk  L Pupil Size (mm) 2  L Pupil Shape Round  L Pupil Reaction Brisk  Additional Pupil Assessments No  Neuro Symptoms None  Glasgow Coma Scale  Eye Opening 4  Best Verbal Response (NON-intubated) 4  Best Motor Response 6  Glasgow Coma Scale Score 14  Richmond Agitation Sedation Scale  Richmond Agitation Sedation Scale (RASS) -1  RASS Goal 0  CAM-ICU Feature 1:  Acute onset of fluctuating course  Is the patient different from his/her baseline mental status? UTA  Delirium Prevention:  Universal Requirements (Complete for all ICU patients)  Universal Precautions Initiated *See Row Information* Yes  HEENT  HEENT (WDL) X  Vision Check No  Teeth Missing (Comment)  Tongue Dry  Mucous Membrane(s) Moist  Voice Intubated/Tracheostomy - Unable to assess  Respiratory  Respiratory (WDL) X  Cough None  Respiratory Pattern Regular  Chest Assessment Chest expansion symmetrical  Bilateral Breath Sounds Diminished  Cardiac  Cardiac (WDL) X  Pulse Regular  Heart Sounds S1, S2;No adventitious heart sounds  Jugular Venous Distention (JVD) No  Cardiac Rhythm NSR  Antiarrhythmic device No  Vascular  Vascular (WDL) X  Edema Generalized  RUE Neurovascular Assessment  R Radial Pulse +1  LUE Neurovascular Assessment  L Radial Pulse +1  RLE Neurovascular Assessment  R Dorsalis Pedis Pulse +1  LLE Neurovascular Assessment  L Dorsalis Pedis Pulse +1  Integumentary  Integumentary (WDL) X  Skin Color Pale  Skin Condition Diaphoretic  Skin Integrity Ecchymosis  Ecchymosis Location Arm  Ecchymosis Location  Orientation Right;Left  Ecchymosis Intervention Cleansed;Thin film  Sacral Foam Prophylactic Dressing Protocol  Dressing Interventions Dressing intact  Dressing Change Due Aug 03, 2017  Braden Interventions  Braden Scale Interventions Reposition q2h  Musculoskeletal  Musculoskeletal (WDL) X  Generalized Weakness Yes  Weight Bearing Restrictions No  Gastrointestinal  Gastrointestinal (WDL) X  Abdomen Inspection Soft;Obese  Bowel Sounds Assessment Hypoactive  Tenderness Nontender  Passing Flatus No  GU Assessment  Genitourinary (WDL) X  Genitalia  Male Genitalia Intact  Urine Characteristics  Urinary Incontinence No  Urine Color Amber  Urine Appearance Clear  Urine Odor No odor  Urethral Catheter Katie, RN Double-lumen;Temperature probe 14 Fr.  Placement Date/Time: 07/13/2017 1435   Perineal care performed prior to insertion?: Yes  Person Inserting Catheter: Joellen Jersey, RN  Person Assisting with Catheter Insertion: Radonna Ricker, RN  Patient Location at Time of Insertion: ED 26  Catheter Type: Double-lumen;...  Indication for Insertion or Continuance of Catheter Unstable critical patients (first 24-48 hours)  Site Assessment Clean;Intact  Catheter Maintenance Bag below level of bladder;Drainage bag/tubing not touching floor;No dependent loops;Seal intact  Collection Container Standard drainage bag  Securement Method Securing device (Describe)  Urinary Catheter Interventions Unclamped  Psychosocial  Psychosocial (WDL) X  Patient Behaviors Flat affect

## 2017-07-04 NOTE — Consult Note (Signed)
[facility] CONSULT NOTE  Patient Care Team: Maryland Pink, MD as PCP - General (Family Medicine) Cammie Sickle, MD as Consulting Physician (Oncology) Christene Lye, MD (General Surgery)  CHIEF COMPLAINTS/PURPOSE OF CONSULTATION:  Metastatic sarcoma  HISTORY OF PRESENTING ILLNESS: Please note patient is sedated/accompanied by family. Stephen [name] 76 y.o.  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 Patient immediately after receiving olaratumumab- went into anaphylactic shock/cardiac arrested. After lengthy cardiopulmonary resuscitation- including multiple rounds epi, defibrillation; intubation- patient was transferred to ICU for further care.  Overnight patient did fairly well-and in the morning extubated. As per the family patient had been conversant with them. As per the family he had been complaining of "pain all over"  However towards afternoon patient got delirious; and pulled his IJ. He is currently sedated. He also complained of chest pain- had EKG done.  ROS: Review of systems cannot be done.   MEDICAL HISTORY:  Past Medical History:  Diagnosis Date  . Cancer [facility]) 2007   Lung CA- Bilateral  . Coronary artery disease   . History of kidney stones   . Lung cancer ([place]) 07/16/2015  . Myocardial infarction [facility]) 2000   coronary stent  . Sarcoma [facility]) 2013   Right groin resection.   . Shortness of breath dyspnea   . Stroke [facility]) 2005    SURGICAL HISTORY: Past Surgical History:  Procedure Laterality Date  . ABDOMINAL AORTIC ANEURYSM REPAIR  10/28/2012   Dr Lucky Cowboy  . CAROTID ENDARTERECTOMY Right   . COLONOSCOPY  2013  . CYSTOSCOPY WITH LITHOLAPAXY N/A 04/23/2017   Procedure: CYSTOSCOPY WITH LITHOLAPAXY;  Surgeon: Nickie Retort, MD;  Location: [facility] ORS;  Service: Urology;  Laterality: N/A;  . ENDOBRONCHIAL  ULTRASOUND N/A 08/08/2016   Procedure: ENDOBRONCHIAL ULTRASOUND;  Surgeon: Vilinda Boehringer, MD;  Location: [facility] ORS;  Service: Cardiopulmonary;  Laterality: N/A;  . EYE SURGERY Bilateral 2010  . HOLMIUM LASER APPLICATION N/A 05/24/8365   Procedure: HOLMIUM LASER APPLICATION;  Surgeon: Nickie Retort, MD;  Location: [facility] ORS;  Service: Urology;  Laterality: N/A;  . PORTA CATH INSERTION N/A 06/23/2017   Procedure: Glori Luis Cath Insertion;  Surgeon: Algernon Huxley, MD;  Location: [facility] CV LAB;  Service: Cardiovascular;  Laterality: N/A;  . SKIN CANCER EXCISION Left   . THORACOTOMY Bilateral     SOCIAL HISTORY: Social History   Social History  . Marital status: Married    Spouse name: N/A  . Number of children: N/A  . Years of education: N/A   Occupational History  . Not on file.   Social History Main Topics  . Smoking status: Former Smoker    Quit date: 07/31/2003  . Smokeless tobacco: Never Used  . Alcohol use No  . Drug use: No  . Sexual activity: Not on file   Other Topics Concern  . Not on file   Social History Narrative  . No narrative on file    FAMILY HISTORY: Family History  Problem Relation Age of Onset  . Hematuria Neg Hx   . Prostate cancer Neg Hx     ALLERGIES:  has No Known Allergies.  MEDICATIONS:  Current Facility-Administered Medications  Medication Dose Route Frequency Provider Last Rate Last Dose  . chlorhexidine gluconate (MEDLINE KIT) (PERIDEX) 0.12 % solution 15 mL  15 mL Mouth Rinse BID Flora Lipps, MD   15 mL at 07/04/17 0830  .  dexmedetomidine (PRECEDEX) 400 MCG/100ML (4 mcg/mL) infusion  0-1.2 mcg/kg/hr Intravenous Titrated Wilhelmina Mcardle, MD 4.9 mL/hr at 07/04/17 1733 0.2 mcg/kg/hr at 07/04/17 1733  . famotidine (PEPCID) IVPB 20 mg premix  20 mg Intravenous Q24H Flora Lipps, MD   Stopped at 07/04/17 1657  . fentaNYL (SUBLIMAZE) injection 12.5-25 mcg  12.5-25 mcg Intravenous Q2H PRN Wilhelmina Mcardle, MD   25 mcg at 07/04/17 1645  . heparin  ADULT infusion 100 units/mL (25000 units/210m sodium chloride 0.45%)  1,150 Units/hr Intravenous Continuous Hallaji, SDani Gobble RPH 11.5 mL/hr at 07/04/17 1841 1,150 Units/hr at 07/04/17 1841  . HYDROcodone-acetaminophen (NORCO/VICODIN) 5-325 MG per tablet 1-2 tablet  1-2 tablet Oral Q4H PRN KEpifanio Lesches MD      . insulin aspart (novoLOG) injection 0-9 Units  0-9 Units Subcutaneous Q4H BAwilda Bill NP   2 Units at 07/04/17 1637  . ipratropium-albuterol (DUONEB) 0.5-2.5 (3) MG/3ML nebulizer solution 3 mL  3 mL Nebulization Q6H KEpifanio Lesches MD   3 mL at 07/04/17 1342  . lactated ringers infusion   Intravenous Continuous SWilhelmina Mcardle MD 50 mL/hr at 07/04/17 1130    . lip balm (BLISTEX) ointment   Topical PRN SWilhelmina Mcardle MD      . MEDLINE mouth rinse  15 mL Mouth Rinse QID KFlora Lipps MD   15 mL at 07/04/17 1635  . norepinephrine (LEVOPHED) 16 mg in dextrose 5 % 250 mL (0.064 mg/mL) infusion  0-40 mcg/min Intravenous Titrated SWilhelmina Mcardle MD 8.4 mL/hr at 07/04/17 1748 9 mcg/min at 07/04/17 1748  . ondansetron (ZOFRAN) injection 4 mg  4 mg Intravenous Q6H PRN KEpifanio Lesches MD          .  PHYSICAL EXAMINATION:  Vitals:   07/04/17 1800 07/04/17 1815  BP: (!) 109/49 (!) 131/55  Pulse: 64 67  Resp: 10 14  Temp:     Filed Weights   06/27/2017 1439  Weight: 217 lb (98.4 kg)    GENERAL: Well-nourished well-developed; Drowsy/sedated. Accompanied by his son and wife. Currently extubated EYES: no pallor or icterus OROPHARYNX: no thrush or ulceration. NECK: supple, no masses felt; IJ in place LUNGS: decreased breath sounds to auscultation at bases and  No wheeze or crackles HEART/CVS: regular rate & rhythm and no murmurs; No lower extremity edema ABDOMEN: abdomen soft, non-tender and normal bowel sounds Musculoskeletal:no cyanosis of digits and no clubbing  PSYCH: Drowsy;  NEURO: no focal motor/sensory deficits SKIN:  no rashes or significant  lesions  LABORATORY DATA:  I have reviewed the data as listed Lab Results  Component Value Date   WBC 22.8 (H) 07/04/2017   HGB 15.7 07/04/2017   HCT 47.7 07/04/2017   MCV 83.7 07/04/2017   PLT 281 07/04/2017    Recent Labs  06/27/17 1435 07/02/2017 1050 06/26/2017 1405 07/04/17 0500 07/04/17 1454  NA 137 138 138 136 137  K 4.4 4.5 4.3 5.4* 5.0  CL 103 103 105 103 102  CO2 25 27 18* 23 26  GLUCOSE 142* 126* 184* 213* 208*  BUN 13 11 11 20  30*  CREATININE 0.84 0.99 1.03 1.90* 2.06*  CALCIUM 9.1 9.5 10.9* 8.1* 8.0*  GFRNONAA >60 >60 >60 33* 30*  GFRAA >60 >60 >60 38* 35*  PROT 7.0 7.1 6.1*  --   --   ALBUMIN 3.6 3.7 3.0*  --   --   AST 23 25 41  --   --   ALT 11* 13* 18  --   --  ALKPHOS 77 79 87  --   --   BILITOT 0.7 0.6 0.8  --   --     RADIOGRAPHIC STUDIES: I have personally reviewed the radiological images as listed and agreed with the findings in the report. Dg Chest 2 View  Result Date: 06/16/2017 CLINICAL DATA:  Dry cough since October 2017. History of bilateral lung cancer. EXAM: CHEST  2 VIEW COMPARISON:  Chest CT 03/05/2017 FINDINGS: There is complete opacification of the left hemithorax which is new since the prior recent chest CT from March. There is loss of volume in the left hemithorax with hyperinflation of the right lung. Possible density in the left hilum could be an enlarging left hilar massa causing obstruction of the left mainstem bronchus. Recommend followup chest CT with contrast for further evaluation. No right lung lesions. No obvious pleural effusion. Stable left rib changes. IMPRESSION: Complete opacification of the left hemithorax with a bronchial cut off sign involving the left mainstem bronchus likely due to enlarging left hilar mass. Recommend chest CT with contrast for further evaluation. Electronically Signed   By: Marijo Sanes M.D.   On: 06/16/2017 15:37   Ct Chest W Contrast  Result Date: 06/17/2017 CLINICAL DATA:  Left hilar sarcoma  (metastatic from the left thigh). Cough. 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 Prior left lower lobectomy and wedge resection in the left upper lobe. Prior right upper lobectomy. EXAM: CT CHEST WITH CONTRAST TECHNIQUE: Multidetector CT imaging of the chest was performed during intravenous contrast administration. CONTRAST:  47m ISOVUE-300 IOPAMIDOL (ISOVUE-300) INJECTION 61% COMPARISON:  Multiple exams, including 03/05/2017 CT scan FINDINGS: Cardiovascular: The mass previously shown just below the left pulmonary artery appears to of invaded up into the pulmonary artery, causing extensive tumor thrombus in the left pulmonary artery as shown on image 104/5. The left pulmonary artery is not entirely occluded but is significantly narrowed. Coronary, aortic arch, and branch vessel atherosclerotic vascular disease. Leftward shift of cardiac and mediastinal structures. Mediastinum/Nodes: The left infrahilar mass is of similar density to the completely atelectatic surrounding left upper lobe and accordingly is difficult to measure, but is subjectively a believed to be increased in size compared to prior, and has now invaded the left pulmonary artery as described above. Moreover, there are low-density components of the peripheral left hemithorax which may be from loculated pleural fluid or pleural tumor, for example measuring up to 2 cm in thickness along the left upper thorax on image 49/2 posterolaterally. Small scattered mediastinal lymph nodes are increased in number compared to the prior exam. An index lymph node adjacent to the upper esophagus measures 8 mm in short axis on image 29/2, previously 3 mm. Small left eccentric pericardial effusion. Pericardial invasion by tumor not excluded. Lungs/Pleura: Complete atelectasis of the left hemithorax attributed to occlusion of the left upper lobe bronchus and accordingly complete atelectasis of the left upper lobe. Ill-defined tumor along the  infrahilar region, difficult to measure but subjectively increased from prior. Mild airway thickening in the right lung. Prior right upper lobectomy. Stable sub solid 6 by 5 mm right middle lobe pulmonary nodule on image 84/3. Upper Abdomen: Unremarkable Musculoskeletal: Old healed rib deformities.  Thoracic spondylosis. IMPRESSION: 1. Enlarging left infrahilar mass invades and severely narrows although does not totally occlude the left pulmonary artery. There is associated tumor thrombus in the left upper lobe pulmonary artery. The patient has had prior left lower lobectomy in the left upper lobe is completely collapsed with complete atelectasis  of the remaining left lung parenchyma. There also loculated low-density pleural lesions on the left probably from loculated if lesions, less likely from pleural tumor. 2. I do not see any filling defects in the right pulmonary arterial tree, although today's exam was performed as a general diagnostic CT and not as a dedicated pulmonary embolus CT angiography study, and accordingly may have some reduced sensitivity for smaller pulmonary emboli. 3. Left eccentric pericardial effusion, I cannot exclude early invasion of tumor into the pericardium. 4. Mediastinal lymph nodes have slightly enlarged and are increased in number, in these could be inflammatory or neoplastic. 5. Subsolid 5 by 6 mm right middle lobe pulmonary nodule is stable and merits surveillance. 6.  Aortic Atherosclerosis (ICD10-I70.0).  Coronary atherosclerosis. Critical Value/emergent results were called by telephone at the time of interpretation on 06/17/2017 at 4:54 pm to Dr. Charlaine Dalton , who verbally acknowledged these results. Dr. Rogue Bussing instructed me to release the patient, and plans to contact the patient himself. Electronically Signed   By: Van Clines M.D.   On: 06/17/2017 17:00   Dg Chest Port 1 View  Result Date: 07/09/2017 CLINICAL DATA:  Central line placement. On ventilator.  Lung carcinoma. EXAM: PORTABLE CHEST 1 VIEW COMPARISON:  06/23/2017 FINDINGS: A new left jugular central venous catheter is seen with tip overlying the proximal SVC. A new nasogastric tube is also seen entering the stomach. Endotracheal tube and right jugular central venous catheter remain in appropriate position. Complete opacification of left hemithorax is again seen. Left lung remains clear. No pneumothorax visualized. IMPRESSION: New left jugular central venous catheter and enteric tube is in appropriate position. No pneumothorax visualized. Stable complete opacification of left hemithorax. Electronically Signed   By: Earle Gell M.D.   On: 06/24/2017 18:34   Dg Chest Port 1 View  Result Date: 06/29/2017 CLINICAL DATA:  Cardiac arrest.  Stent placement. EXAM: PORTABLE CHEST 1 VIEW COMPARISON:  06/16/2017 FINDINGS: Chronic left lung collapse due to central obstructing mass. There is an endotracheal tube with tip at the clavicular heads. Porta catheter on the right with tip at the SVC level. The right lung is clear. No evidence of pneumothorax. No cardiomegaly. IMPRESSION: 1. New endotracheal tube in unremarkable position. 2. Known complete collapse of the left lung from hilar mass. Electronically Signed   By: Monte Fantasia M.D.   On: 07/05/2017 14:37    ASSESSMENT & PLAN:   # 76 year old male patient with metastatic sarcoma to the lung currently admitted to the hospital status post anaphylactic shock cardiac arrest chemotherapy.  # Metastatic sarcoma- [undifferentiated pleomorphic sarcoma]- status post cycle #1 of Adriamycin- [place]; day #2. Overall poor prognosis.  # Anaphylactic shock/cardiac arrest secondary to Olaratumab- status post CPR. Status post extubation. Currently back on pressors.  # Non-STEMI- elevated troponins/EKG nonspecific changes. Recommend cardiology consultation. Discussed with nurse practitioner, Hinton Dyer.   # Long discussion the patient's family [wife, son, daughter Sister]-  regarding the difficult situation. They understand the prognosis is guarded.   Thank you Dr. Manuella Ghazi for allowing me to participate in the care of your pleasant patient. Please do not hesitate to contact me with questions or concerns in the interim.  All questions were answered.   # 60 minutes face-to-face with the patient discussing the above plan of care; more than 50% of time spent on prognosis/ natural history; counseling and coordination.   Cammie Sickle, MD 07/04/2017 7:19 PM

## 2017-07-04 NOTE — Progress Notes (Signed)
07/04/17 0800  Charting Type  Charting Type Shift assessment  Orders Chart Check (once per shift) Completed  Neurological  Neuro (WDL) X  Level of Consciousness Responds to Voice  Orientation Level Intubated/Tracheostomy - Unable to assess  Cognition Unable to follow commands  Speech Intubated/Tracheostomy - Unable to assess  Pupil Assessment  Yes  R Pupil Size (mm) 2  R Pupil Shape Round  R Pupil Reaction Brisk  L Pupil Size (mm) 2  L Pupil Shape Round  L Pupil Reaction Brisk  Additional Pupil Assessments No  Neuro Symptoms None  Glasgow Coma Scale  Eye Opening 4  Modified Verbal Response (INTUBATED) 3  Best Motor Response 4  Glasgow Coma Scale Score 11  Richmond Agitation Sedation Scale  Richmond Agitation Sedation Scale (RASS) -2  RASS Goal -2  Delirium Prevention:  Universal Requirements (Complete for all ICU patients)  Universal Precautions Initiated *See Row Information* Yes  HEENT  HEENT (WDL) X  Vision Check No  Teeth Missing (Comment)  Tongue Dry  Mucous Membrane(s) Moist  Voice Intubated/Tracheostomy - Unable to assess  Respiratory  Respiratory (WDL) X  Cough None  Respiratory Pattern Regular  Chest Assessment Chest expansion symmetrical  Bilateral Breath Sounds Expiratory wheezes  Airway Suctioning/Secretions  Suction Type ETT  Suction Device  Catheter  Secretion Amount Small  Secretion Color Pink tinged  Secretion Consistency Thin  Suction Tolerance Tolerated well  Suctioning Adverse Effects None  Cardiac  Cardiac (WDL) X  Pulse Regular  Heart Sounds S1, S2;No adventitious heart sounds  Jugular Venous Distention (JVD) No  Cardiac Rhythm NSR  Telemetry Box Number [id number]  Tele Box Verification Completed by Second Verifier Completed  Antiarrhythmic device No  Vascular  Vascular (WDL) X  Edema Generalized  RUE Neurovascular Assessment  R Radial Pulse +1  LUE Neurovascular Assessment  L Radial Pulse +1  RLE Neurovascular Assessment  R Dorsalis  Pedis Pulse +1  LLE Neurovascular Assessment  L Dorsalis Pedis Pulse +1  Integumentary  Integumentary (WDL) X  Skin Color Pale  Skin Condition Diaphoretic  Skin Integrity Ecchymosis  Ecchymosis Location Arm  Ecchymosis Location Orientation Right;Left  Ecchymosis Intervention Cleansed;Thin film  Sacral Foam Prophylactic Dressing Protocol  Dressing Interventions Dressing intact  Dressing Change Due 08/05/2017  Braden Scale (Ages 38 and up)  Sensory Perceptions 3  Moisture 4  Activity 1  Mobility 3  Nutrition 2  Friction and Shear 3  Braden Scale Score 16  Braden Interventions  Braden Scale Interventions Reposition q2h  Musculoskeletal  Musculoskeletal (WDL) X  Assistive Device MaxiSlide  Generalized Weakness Yes  Weight Bearing Restrictions No  Gastrointestinal  Gastrointestinal (WDL) X  Abdomen Inspection Soft;Obese  Bowel Sounds Assessment Hypoactive  Tenderness Nontender  Passing Flatus No  GU Assessment  Genitourinary (WDL) X  Genitalia  Male Genitalia Intact  Urine Characteristics  Urinary Incontinence No  Urine Color Amber  Urine Appearance Clear  Urine Odor No odor  Urethral Catheter Katie, RN Double-lumen;Temperature probe 14 Fr.  Placement Date/Time: 07/01/2017 1435   Perineal care performed prior to insertion?: Yes  Person Inserting Catheter: Joellen Jersey, RN  Person Assisting with Catheter Insertion: Radonna Ricker, RN  Patient Location at Time of Insertion: ED 26  Catheter Type: Double-lumen;...  Indication for Insertion or Continuance of Catheter Unstable critical patients (first 24-48 hours)  Site Assessment Clean;Intact  Catheter Maintenance Bag below level of bladder;Drainage bag/tubing not touching floor;No dependent loops;Seal intact  Collection Container Standard drainage bag  Securement Method Securing device (Describe)  Urinary  Catheter Interventions Unclamped  Psychosocial  Psychosocial (WDL) X  Patient Behaviors Not interactive

## 2017-07-05 ENCOUNTER — Inpatient Hospital Stay: Payer: Medicare Other

## 2017-07-05 LAB — CBC
HEMATOCRIT: 38.1 % — AB (ref 40.0–52.0)
Hemoglobin: 12.8 g/dL — ABNORMAL LOW (ref 13.0–18.0)
MCH: 27.7 pg (ref 26.0–34.0)
MCHC: 33.6 g/dL (ref 32.0–36.0)
MCV: 82.5 fL (ref 80.0–100.0)
PLATELETS: 201 10*3/uL (ref 150–440)
RBC: 4.62 MIL/uL (ref 4.40–5.90)
RDW: 13.9 % (ref 11.5–14.5)
WBC: 15.6 10*3/uL — AB (ref 3.8–10.6)

## 2017-07-05 LAB — COMPREHENSIVE METABOLIC PANEL
ALBUMIN: 2.7 g/dL — AB (ref 3.5–5.0)
ALT: 25 U/L (ref 17–63)
AST: 56 U/L — AB (ref 15–41)
Alkaline Phosphatase: 48 U/L (ref 38–126)
Anion gap: 8 (ref 5–15)
BUN: 35 mg/dL — AB (ref 6–20)
CHLORIDE: 102 mmol/L (ref 101–111)
CO2: 27 mmol/L (ref 22–32)
Calcium: 8.1 mg/dL — ABNORMAL LOW (ref 8.9–10.3)
Creatinine, Ser: 1.66 mg/dL — ABNORMAL HIGH (ref 0.61–1.24)
GFR calc Af Amer: 45 mL/min — ABNORMAL LOW (ref >60)
GFR calc non Af Amer: 39 mL/min — ABNORMAL LOW (ref >60)
GLUCOSE: 202 mg/dL — AB (ref 65–99)
POTASSIUM: 4.6 mmol/L (ref 3.5–5.1)
SODIUM: 137 mmol/L (ref 135–145)
Total Bilirubin: 0.6 mg/dL (ref 0.3–1.2)
Total Protein: 5.1 g/dL — ABNORMAL LOW (ref 6.5–8.1)

## 2017-07-05 LAB — GLUCOSE, CAPILLARY
GLUCOSE-CAPILLARY: 203 mg/dL — AB (ref 65–99)
GLUCOSE-CAPILLARY: 162 mg/dL — AB (ref 65–99)
Glucose-Capillary: 233 mg/dL — ABNORMAL HIGH (ref 65–99)

## 2017-07-05 LAB — HEPARIN LEVEL (UNFRACTIONATED): HEPARIN UNFRACTIONATED: 0.65 [IU]/mL (ref 0.30–0.70)

## 2017-07-05 LAB — MAGNESIUM: Magnesium: 2.4 mg/dL (ref 1.7–2.4)

## 2017-07-05 LAB — TROPONIN I
Troponin I: 2.17 ng/mL (ref <0.03)
Troponin I: 2.86 ng/mL (ref <0.03)

## 2017-07-05 LAB — PHOSPHORUS: Phosphorus: 4.4 mg/dL (ref 2.5–4.6)

## 2017-07-05 MED ORDER — ACETAMINOPHEN 325 MG PO TABS
650.0000 mg | ORAL_TABLET | Freq: Four times a day (QID) | ORAL | Status: DC | PRN
  Administered 2017-07-05: 650 mg via ORAL
  Filled 2017-07-05: qty 2

## 2017-07-05 MED ORDER — LORAZEPAM 2 MG/ML IJ SOLN
2.0000 mg | INTRAMUSCULAR | Status: DC | PRN
  Administered 2017-07-05 – 2017-07-06 (×4): 2 mg via INTRAVENOUS
  Filled 2017-07-05 (×4): qty 1

## 2017-07-05 MED ORDER — HALOPERIDOL LACTATE 5 MG/ML IJ SOLN
2.0000 mg | Freq: Four times a day (QID) | INTRAMUSCULAR | Status: DC | PRN
  Administered 2017-07-05: 2 mg via INTRAVENOUS
  Filled 2017-07-05: qty 1

## 2017-07-05 MED ORDER — ORAL CARE MOUTH RINSE
15.0000 mL | Freq: Two times a day (BID) | OROMUCOSAL | Status: DC
  Administered 2017-07-05 – 2017-07-07 (×6): 15 mL via OROMUCOSAL

## 2017-07-05 MED ORDER — ENOXAPARIN SODIUM 40 MG/0.4ML ~~LOC~~ SOLN
40.0000 mg | SUBCUTANEOUS | Status: DC
  Administered 2017-07-05 – 2017-07-06 (×2): 40 mg via SUBCUTANEOUS
  Filled 2017-07-05 (×2): qty 0.4

## 2017-07-05 MED FILL — Medication: Qty: 1 | Status: AC

## 2017-07-05 NOTE — Progress Notes (Signed)
Patient ID: Stephen [name], male   DOB: [birth year], 76 y.o.   MRN: [id number]   Called to see patient do to worsening respiratory failure. Now on 5 liters [place] to maintain sats. Patient is alert and shaking all over. Complains of pain in sternal area and abdomen. Multiple family members at bedside. Patient wheezing on exam with deminished brath sounds left lung field.  1. Acute on Chronic Respiratory failure. Will repeat CXR to rule out pneumonia. Use nebs frequently. Also, suspect he is splinting from pain secondary from CPR he had yesterday. Use pain meds more liberally.   Time spent: 30 min

## 2017-07-05 NOTE — Evaluation (Signed)
Clinical/Bedside Swallow Evaluation Patient Details  Name: Stephen [name] MRN: [id number] Date of Birth: 14-Aug-1941  Today's Date: 07/05/2017 Time: SLP Start Time (ACUTE ONLY): 1000 SLP Stop Time (ACUTE ONLY): 1100 SLP Time Calculation (min) (ACUTE ONLY): 60 min  Past Medical History:  Past Medical History:  Diagnosis Date  . Cancer [facility]) 2007   Lung CA- Bilateral  . Coronary artery disease   . History of kidney stones   . Lung cancer ([place]) 07/16/2015  . Myocardial infarction [facility]) 2000   coronary stent  . Sarcoma [facility]) 2013   Right groin resection.   . Shortness of breath dyspnea   . Stroke [facility]) 2005   Past Surgical History:  Past Surgical History:  Procedure Laterality Date  . ABDOMINAL AORTIC ANEURYSM REPAIR  10/28/2012   Dr Lucky Cowboy  . CAROTID ENDARTERECTOMY Right   . COLONOSCOPY  2013  . CYSTOSCOPY WITH LITHOLAPAXY N/A 04/23/2017   Procedure: CYSTOSCOPY WITH LITHOLAPAXY;  Surgeon: Nickie Retort, MD;  Location: [facility] ORS;  Service: Urology;  Laterality: N/A;  . ENDOBRONCHIAL ULTRASOUND N/A 08/08/2016   Procedure: ENDOBRONCHIAL ULTRASOUND;  Surgeon: Vilinda Boehringer, MD;  Location: [facility] ORS;  Service: Cardiopulmonary;  Laterality: N/A;  . EYE SURGERY Bilateral 2010  . HOLMIUM LASER APPLICATION N/A 4/0/9811   Procedure: HOLMIUM LASER APPLICATION;  Surgeon: Nickie Retort, MD;  Location: [facility] ORS;  Service: Urology;  Laterality: N/A;  . PORTA CATH INSERTION N/A 06/23/2017   Procedure: Glori Luis Cath Insertion;  Surgeon: Algernon Huxley, MD;  Location: [place] CV LAB;  Service: Cardiovascular;  Laterality: N/A;  . SKIN CANCER EXCISION Left   . THORACOTOMY Bilateral    HPI:  Pt has a history of recurrent lung cancer status post left-sided lung resection, MI, COPD, who presents to the emergency department as a cardiac arrest/CODE BLUE. According to report given by the infusion nurse the patient was receiving his first dose of chemotherapy when he slumped over and became unresponsive;  PEA following first one drop of chemotherapy with new medicine. Patient was given IV Solu-Medrol 125 mg, CODE BLUE was called, patient had been intubated in the emergency room, defibrillated multiple times, patient had pulse and started on Neo-Synephrine drip in the emergency room, patient received multiple doses of epinephrine, patient lost pulses about 4 times as per discussion with ER physician. Pt has been eating softer foods at home - NOT using his dentures therefore unable to chew the tougher foods for swallowing easily per Daughter's report. He has been drinking fluids and eating ice cream w/out difficulty per her report. Pt currently alert/awake but not overly verbal and interactive; appeared weak and not feeling well. Noted congested cough at baseline. Pt required moderate cues for follow through w/ tasks; confusion noted.    Assessment / Plan / Recommendation Clinical Impression  Pt appears at increased risk for aspiration secondary to declined Cognitive status and overall declined medical status w/ recent cardiac arrest and oral intubation; pt appears quite fatigued w/ confusion. He required verbal/tactile cues and support for follow through w/ tasks of oral intake. No immediate, overt s/s of aspiration noted w/ trials, however, pt only accepted a few po trials total b/f declining everything else. Oral phase appeared grossly wfl for bolus management and clearing but again only few trials accepted. Pt is edentulous and not wearing his dentures at this time. Due to overall oropharyngeal phase dysphagia and declined medical/Cogitive status', recommend a modified diet of dysphagia level 2 w/ thin liquids but w/ STRICT aspiration  precautions; ST f/u for further modification of diet if needed; Pills in puree w/ NSG; assistance w/ feeding and monitoring w/ po's at meals by staff.  SLP Visit Diagnosis: Dysphagia, oropharyngeal phase (R13.12)    Aspiration Risk  Mild aspiration risk;Moderate aspiration  risk    Diet Recommendation  Dysphagia level 2 (minced) w/ Thin liquids; strict aspiration precautions; support w/ feeding at meals  Medication Administration: Crushed with puree    Other  Recommendations Recommended Consults:  (Dietician f/u) Oral Care Recommendations: Oral care BID;Staff/trained caregiver to provide oral care   Follow up Recommendations Skilled Nursing facility (TBD)      Frequency and Duration min 3x week  2 weeks       Prognosis Prognosis for Safe Diet Advancement: Fair Barriers to Reach Goals: Cognitive deficits;Severity of deficits (baseline medical status)      Swallow Study   General Date of Onset: 07/01/2017 HPI: Pt has a history of recurrent lung cancer status post left-sided lung resection, MI, COPD, who presents to the emergency department as a cardiac arrest/CODE BLUE. According to report given by the infusion nurse the patient was receiving his first dose of chemotherapy when he slumped over and became unresponsive; PEA following first one drop of chemotherapy with new medicine. Patient was given IV Solu-Medrol 125 mg, CODE BLUE was called, patient had been intubated in the emergency room, defibrillated multiple times, patient had pulse and started on Neo-Synephrine drip in the emergency room, patient received multiple doses of epinephrine, patient lost pulses about 4 times as per discussion with ER physician. Pt has been eating softer foods at home - NOT using his dentures therefore unable to chew the tougher foods for swallowing easily per Daughter's report. He has been drinking fluids and eating ice cream w/out difficulty per her report. Pt currently alert/awake but not overly verbal and interactive; appeared weak and not feeling well. Noted congested cough at baseline. Pt required moderate cues for follow through w/ tasks; confusion noted.  Type of Study: Bedside Swallow Evaluation Previous Swallow Assessment: none Diet Prior to this Study: Dysphagia 3  (soft);Thin liquids (at home per report) Temperature Spikes Noted: No (wbc 15.6) Respiratory Status: Nasal cannula (2-4 liters) History of Recent Intubation: Yes Length of Intubations (days): 2 days Date extubated: 07/04/17 (evening) Behavior/Cognition: Confused;Distractible;Requires cueing (awake ) Oral Cavity Assessment: Within Functional Limits Oral Care Completed by SLP: Recent completion by staff Oral Cavity - Dentition: Edentulous (does not wear his dentures) Vision:  (n/a) Self-Feeding Abilities: Needs assist;Needs set up;Total assist Patient Positioning: Upright in bed Baseline Vocal Quality: Low vocal intensity (gravely) Volitional Cough: Cognitively unable to elicit Volitional Swallow: Unable to elicit    Oral/Motor/Sensory Function Overall Oral Motor/Sensory Function:  (appeared fairly wfl w/ bolus management)   Ice Chips Ice chips: Impaired Presentation: Spoon (3 trials) Oral Phase Impairments: Poor awareness of bolus Oral Phase Functional Implications: Prolonged oral transit Pharyngeal Phase Impairments:  (none)   Thin Liquid Thin Liquid: Impaired Presentation: Cup;Straw (2 trials total) Oral Phase Impairments: Poor awareness of bolus Pharyngeal  Phase Impairments:  (none) Other Comments: belched post each trial; would not accept further trials    Nectar Thick Nectar Thick Liquid: Not tested   Honey Thick Honey Thick Liquid: Not tested   Puree Puree: Within functional limits Presentation: Spoon (fed; 2 trials ) Other Comments: grossly wfl   Solid   GO   Solid: Not tested Other Comments: would not accept         Orinda Kenner, MS,  CCC-SLP Watson,Katherine 07/05/2017,11:15 AM

## 2017-07-05 NOTE — Progress Notes (Signed)
Discussed with Dr Posey Pronto. Agree with transfer to med-surg. With very advanced and probably untreatable sarcoma, I do not think that an extensive cardiac work up is indicated. The mild increase in trop I is well explained by demand ischemia suffered during the period of CPR and anaphylactic shock.  I discussed with family yesterday re: goals of care. The nature of the conversation was that we should strive for DC home with Hospice. I have placed order for Palliative Care consultation  After transfer, PCCM will sign off. Please call if we can be of further assistance.  Merton Border, MD PCCM service Mobile [phone] Pager [phone] 07/05/2017 9:55 AM

## 2017-07-05 NOTE — Progress Notes (Signed)
[place] [facility] NAME: Stephen [name]    MR#:  [id number]  DATE OF BIRTH:  01/30/1941  SUBJECTIVE:  CHIEF COMPLAINT:   Chief Complaint  Patient presents with  . Cardiac Arrest  Patient's with some confusion. REVIEW OF SYSTEMS:  Review of Systems  Constitutional: Negative for chills, fever and weight loss.  HENT: Negative for nosebleeds and sore throat.   Eyes: Negative for blurred vision.  Respiratory: Negative for cough, shortness of breath and wheezing.   Cardiovascular: Negative for chest pain, orthopnea, leg swelling and PND.  Gastrointestinal: Negative for abdominal pain, constipation, diarrhea, heartburn, nausea and vomiting.  Genitourinary: Negative for dysuria and urgency.  Musculoskeletal: Negative for back pain.  Skin: Negative for rash.  Neurological: Negative for dizziness, speech change, focal weakness and headaches.  Endo/Heme/Allergies: Does not bruise/bleed easily.  Psychiatric/Behavioral: Negative for depression.    DRUG ALLERGIES:  No Known Allergies VITALS:  Blood pressure (!) 123/51, pulse 87, temperature 98.3 F (36.8 C), temperature source Oral, resp. rate 18, height 6\' 2"  (1.88 m), weight 231 lb 7.7 oz (105 kg), SpO2 93 %. PHYSICAL EXAMINATION:  Physical Exam  Constitutional: He is well-developed, well-nourished, and in no distress.  HENT:  Head: Normocephalic and atraumatic.  Eyes: Pupils are equal, round, and reactive to light. Conjunctivae and EOM are normal.  Neck: Normal range of motion. Neck supple. No tracheal deviation present. No thyromegaly present.  Cardiovascular: Normal rate, regular rhythm and normal heart sounds.   Pulmonary/Chest: Effort normal and breath sounds normal. No respiratory distress. He has no wheezes. He exhibits no tenderness.  Abdominal: Soft. Bowel sounds are normal. He exhibits no distension. There is no tenderness.  Musculoskeletal: Normal range of motion.  Neurological:  He is alert. No cranial nerve deficit.  Oriented to person but not place or time  Skin: Skin is warm and dry. No rash noted.  Psychiatric: Mood and affect normal.   LABORATORY PANEL:  Male CBC  Recent Labs Lab 07/05/17 0508  WBC 15.6*  HGB 12.8*  HCT 38.1*  PLT 201   ------------------------------------------------------------------------------------------------------------------ Chemistries   Recent Labs Lab 07/05/17 0508  NA 137  K 4.6  CL 102  CO2 27  GLUCOSE 202*  BUN 35*  CREATININE 1.66*  CALCIUM 8.1*  MG 2.4  AST 56*  ALT 25  ALKPHOS 48  BILITOT 0.6   RADIOLOGY:  Dg Chest Port 1 View  Result Date: 07/05/2017 CLINICAL DATA:  Acute respiratory failure.  Followup exam. EXAM: PORTABLE CHEST 1 VIEW COMPARISON:  07/16/2017 FINDINGS: No changing complete consolidation of the left hemithorax with mediastinal shift to the left. Right lung is essentially clear. The endotracheal tube and nasogastric tube and left internal jugular central venous line have been removed since the prior study. Right internal jugular Port-A-Cath is stable. IMPRESSION: 1. No acute findings.  Right lung remains essentially clear. 2. Persistent opacification of the left hemithorax with volume loss. 3. Status post removal of all support apparatus with the exception of the right sided Port-A-Cath. Electronically Signed   By: Lajean Manes M.D.   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 Prognosis poor  Transfer to the floor  All the records are reviewed and case discussed with Care Management/Social Worker. Management plans discussed with the patient, nursing, intensivist and they are in agreement.  CODE STATUS: Full Code  TOTAL TIME TAKING CARE OF THIS PATIENT: 32 minutes.   More than 50% of the time was spent in counseling/coordination of care: YES  POSSIBLE D/C IN 2-3 DAYS, DEPENDING ON CLINICAL CONDITION.   Dustin Flock M.D on 07/05/2017 at 1:25 PM  Between 7am to 6pm - Pager - [phone]  After 6pm go to [email or web address] - Proofreader  [facility] [place] [facility]  Office  [phone]  CC: Primary care physician; [place] Pink, MD  Note: This dictation was prepared with Dragon dictation along with smaller phrase technology. Any transcriptional errors that result from this process are unintentional.

## 2017-07-05 NOTE — Progress Notes (Signed)
[facility] [place]  [phone] [phone]  ID: Stephen [name] OB: 04-26-1941  MR#: [id number]  RCV#:[id number]  Patient Care Team: [place] Pink, MD as PCP - General (Family Medicine) Cammie Sickle, MD as Consulting Physician (Oncology) Christene Lye, MD (General Surgery)  CHIEF COMPLAINT: Metastatic sarcoma  INTERVAL HISTORY: Stephen [name] 76 y.o. 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 Now extubated and off pressors.  Still slightly confused. Complains of "pain all over", but states it has improved from yesterday.  Offers not further complaints.  REVIEW OF SYSTEMS:   Review of Systems  Constitutional: Positive for malaise/fatigue. Negative for fever and weight loss.  Respiratory: Negative.  Negative for cough and shortness of breath.   Cardiovascular: Negative.  Negative for chest pain.  Gastrointestinal: Negative.  Negative for abdominal pain.  Genitourinary: Negative.   Musculoskeletal: Positive for myalgias.  Skin: Negative.  Negative for rash.  Neurological: Positive for weakness. Negative for focal weakness.  Psychiatric/Behavioral: Positive for memory loss.       Confused.    As per HPI. Otherwise, a complete review of systems is negative.  PAST MEDICAL HISTORY: Past Medical History:  Diagnosis Date  . Cancer [facility]) 2007   Lung CA- Bilateral  . Coronary artery disease   . History of kidney stones   . Lung cancer ([place]) 07/16/2015  . Myocardial infarction [facility]) 2000   coronary stent  . Sarcoma [facility]) 2013   Right groin resection.   . Shortness of breath dyspnea   . Stroke [facility]) 2005    PAST SURGICAL HISTORY: Past Surgical History:  Procedure Laterality Date  . ABDOMINAL AORTIC ANEURYSM REPAIR  10/28/2012   Dr Lucky Cowboy  . CAROTID ENDARTERECTOMY Right   . COLONOSCOPY  2013  . CYSTOSCOPY WITH LITHOLAPAXY N/A 04/23/2017   Procedure: CYSTOSCOPY WITH LITHOLAPAXY;  Surgeon: Nickie Retort, MD;  Location: [facility] ORS;  Service: Urology;  Laterality: N/A;  . ENDOBRONCHIAL ULTRASOUND N/A 08/08/2016   Procedure: ENDOBRONCHIAL ULTRASOUND;  Surgeon: Vilinda Boehringer, MD;  Location: [facility] ORS;  Service: Cardiopulmonary;  Laterality: N/A;  . EYE SURGERY Bilateral 2010  . HOLMIUM LASER APPLICATION N/A 1/0/2585   Procedure: HOLMIUM LASER APPLICATION;  Surgeon: Nickie Retort, MD;  Location: [facility] ORS;  Service: Urology;  Laterality: N/A;  . PORTA CATH INSERTION N/A 06/23/2017   Procedure: Glori Luis Cath Insertion;  Surgeon: Algernon Huxley, MD;  Location: [facility];  Service: Cardiovascular;  Laterality: N/A;  . SKIN CANCER EXCISION Left   . THORACOTOMY Bilateral     FAMILY HISTORY: Family History  Problem Relation Age of Onset  . Hematuria Neg Hx   . Prostate cancer Neg Hx     ADVANCED DIRECTIVES (Y/N):  @ADVDIR @  HEALTH MAINTENANCE: Social History  Substance Use Topics  . Smoking status: Former Smoker    Quit date: 07/31/2003  . Smokeless tobacco: Never Used  . Alcohol use No     Colonoscopy:  PAP:  Bone density:  Lipid panel:  No Known Allergies  Current Facility-Administered Medications  Medication Dose Route Frequency Provider Last Rate Last Dose  . enoxaparin (LOVENOX) injection 40 mg  40 mg Subcutaneous Q24H Wilhelmina Mcardle, MD   40 mg at 07/05/17 1103  . haloperidol lactate (HALDOL) injection 2 mg  2 mg Intravenous Q6H PRN Dustin Flock, MD   2 mg at 07/05/17 1433  . HYDROcodone-acetaminophen (NORCO/VICODIN) 5-325 MG per tablet 1-2 tablet  1-2 tablet Oral Q4H PRN Epifanio Lesches, MD      . ipratropium-albuterol (DUONEB) 0.5-2.5 (3) MG/3ML nebulizer solution 3 mL  3 mL Nebulization Q6H Epifanio Lesches, MD   3 mL at 07/05/17 1410  . lip balm (BLISTEX) ointment   Topical PRN Wilhelmina Mcardle, MD      . MEDLINE mouth rinse  15 mL Mouth Rinse BID Patria Mane, Magadalene S, NP   15 mL at  07/05/17 1000  . morphine 2 MG/ML injection 2-4 mg  2-4 mg Intravenous Q3H PRN Dorene Sorrow S, NP      . nitroGLYCERIN (NITROSTAT) SL tablet 0.4 mg  0.4 mg Sublingual Q5 min PRN Dorene Sorrow S, NP   0.4 mg at 07/04/17 2219  . ondansetron (ZOFRAN) injection 4 mg  4 mg Intravenous Q6H PRN Epifanio Lesches, MD        OBJECTIVE: Vitals:   07/05/17 1154 07/05/17 1522  BP: (!) 123/51 (!) 136/59  Pulse: 87 (!) 113  Resp: 18 15  Temp: 98.3 F (36.8 C) 98.4 F (36.9 C)     Body mass index is 29.72 kg/m.    ECOG FS:4 - Bedbound  General: Well-developed, well-nourished, no acute distress. Eyes: Pink conjunctiva, anicteric sclera. Lungs: Diminished breath sounds bilaterally. Heart: Regular rate and rhythm. No rubs, murmurs, or gallops. Abdomen: Soft, nontender, nondistended. No organomegaly noted, normoactive bowel sounds. Musculoskeletal: No edema, cyanosis, or clubbing. Neuro: Confused. Cranial nerves grossly intact. Skin: No rashes or petechiae noted. Psych: Normal affect.   LAB RESULTS:  Lab Results  Component Value Date   NA 137 07/05/2017   K 4.6 07/05/2017   CL 102 07/05/2017   CO2 27 07/05/2017   GLUCOSE 202 (H) 07/05/2017   BUN 35 (H) 07/05/2017   CREATININE 1.66 (H) 07/05/2017   CALCIUM 8.1 (L) 07/05/2017   PROT 5.1 (L) 07/05/2017   ALBUMIN 2.7 (L) 07/05/2017   AST 56 (H) 07/05/2017   ALT 25 07/05/2017   ALKPHOS 48 07/05/2017   BILITOT 0.6 07/05/2017   GFRNONAA 39 (L) 07/05/2017   GFRAA 45 (L) 07/05/2017    Lab Results  Component Value Date   WBC 15.6 (H) 07/05/2017   NEUTROABS 3.5 06/28/2017   HGB 12.8 (L) 07/05/2017   HCT 38.1 (L) 07/05/2017   MCV 82.5 07/05/2017   PLT 201 07/05/2017     STUDIES: Dg Chest 2 View  Result Date: 06/16/2017 CLINICAL DATA:  Dry cough since October 2017. History of bilateral lung cancer. EXAM: CHEST  2 VIEW COMPARISON:  Chest CT 03/05/2017 FINDINGS: There is complete opacification of the left hemithorax which  is new since the prior recent chest CT from March. There is loss of volume in the left hemithorax with hyperinflation of the right lung. Possible density in the left hilum could be an enlarging left hilar massa causing obstruction of the left mainstem bronchus. Recommend followup chest CT with contrast for further evaluation. No right lung lesions. No obvious pleural effusion. Stable left rib changes. IMPRESSION: Complete opacification of the left hemithorax with a bronchial cut off sign involving the left mainstem bronchus likely due to enlarging left hilar mass. Recommend chest CT with contrast for further evaluation. Electronically Signed   By: Marijo Sanes M.D.   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 Cough. 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 Prior left lower lobectomy and wedge resection in the  left upper lobe. Prior right upper lobectomy. EXAM: CT CHEST WITH CONTRAST TECHNIQUE: Multidetector CT imaging of the chest was performed during intravenous contrast administration. CONTRAST:  12mL ISOVUE-300 IOPAMIDOL (ISOVUE-300) INJECTION 61% COMPARISON:  Multiple exams, including 03/05/2017 CT scan FINDINGS: Cardiovascular: The mass previously shown just below the left pulmonary artery appears to of invaded up into the pulmonary artery, causing extensive tumor thrombus in the left pulmonary artery as shown on image 104/5. The left pulmonary artery is not entirely occluded but is significantly narrowed. Coronary, aortic arch, and branch vessel atherosclerotic vascular disease. Leftward shift of cardiac and mediastinal structures. Mediastinum/Nodes: The left infrahilar mass is of similar density to the completely atelectatic surrounding left upper lobe and accordingly is difficult to measure, but is subjectively a believed to be increased in size compared to prior, and has now invaded the  left pulmonary artery as described above. Moreover, there are low-density components of the peripheral left hemithorax which may be from loculated pleural fluid or pleural tumor, for example measuring up to 2 cm in thickness along the left upper thorax on image 49/2 posterolaterally. Small scattered mediastinal lymph nodes are increased in number compared to the prior exam. An index lymph node adjacent to the upper esophagus measures 8 mm in short axis on image 29/2, previously 3 mm. Small left eccentric pericardial effusion. Pericardial invasion by tumor not excluded. Lungs/Pleura: Complete atelectasis of the left hemithorax attributed to occlusion of the left upper lobe bronchus and accordingly complete atelectasis of the left upper lobe. Ill-defined tumor along the infrahilar region, difficult to measure but subjectively increased from prior. Mild airway thickening in the right lung. Prior right upper lobectomy. Stable sub solid 6 by 5 mm right middle lobe pulmonary nodule on image 84/3. Upper Abdomen: Unremarkable Musculoskeletal: Old healed rib deformities.  Thoracic spondylosis. IMPRESSION: 1. Enlarging left infrahilar mass invades and severely narrows although does not totally occlude the left pulmonary artery. There is associated tumor thrombus in the left upper lobe pulmonary artery. The patient has had prior left lower lobectomy in the left upper lobe is completely collapsed with complete atelectasis of the remaining left lung parenchyma. There also loculated low-density pleural lesions on the left probably from loculated if lesions, less likely from pleural tumor. 2. I do not see any filling defects in the right pulmonary arterial tree, although today's exam was performed as a general diagnostic CT and not as a dedicated pulmonary embolus CT angiography study, and accordingly may have some reduced sensitivity for smaller pulmonary emboli. 3. Left eccentric pericardial effusion, I cannot exclude early  invasion of tumor into the pericardium. 4. Mediastinal lymph nodes have slightly enlarged and are increased in number, in these could be inflammatory or neoplastic. 5. Subsolid 5 by 6 mm right middle lobe pulmonary nodule is stable and merits surveillance. 6.  Aortic Atherosclerosis (ICD10-I70.0).  Coronary atherosclerosis. Critical Value/emergent results were called by telephone at the time of interpretation on 06/17/2017 at 4:54 pm to Dr. Charlaine Dalton , who verbally acknowledged these results. Dr. Rogue Bussing instructed me to release the patient, and plans to contact the patient himself. Electronically Signed   By: Van Clines M.D.   On: 06/17/2017 17:00   Dg Chest Port 1 View  Result Date: 07/05/2017 CLINICAL DATA:  Acute respiratory failure.  Followup exam. EXAM: PORTABLE CHEST 1 VIEW COMPARISON:  07/12/2017 FINDINGS: No changing complete consolidation of the left hemithorax with mediastinal shift to the left. Right lung is essentially clear. The endotracheal tube and nasogastric  tube and left internal jugular central venous line have been removed since the prior study. Right internal jugular Port-A-Cath is stable. IMPRESSION: 1. No acute findings.  Right lung remains essentially clear. 2. Persistent opacification of the left hemithorax with volume loss. 3. Status post removal of all support apparatus with the exception of the right sided Port-A-Cath. Electronically Signed   By: Lajean Manes M.D.   On: 07/05/2017 08:03   Dg Chest Port 1 View  Result Date: 06/28/2017 CLINICAL DATA:  Central line placement. On ventilator. Lung carcinoma. EXAM: PORTABLE CHEST 1 VIEW COMPARISON:  07/05/2017 FINDINGS: A new left jugular central venous catheter is seen with tip overlying the proximal SVC. A new nasogastric tube is also seen entering the stomach. Endotracheal tube and right jugular central venous catheter remain in appropriate position. Complete opacification of left hemithorax is again seen. Left  lung remains clear. No pneumothorax visualized. IMPRESSION: New left jugular central venous catheter and enteric tube is in appropriate position. No pneumothorax visualized. Stable complete opacification of left hemithorax. Electronically Signed   By: Earle Gell M.D.   On: 07/19/2017 18:34   Dg Chest Port 1 View  Result Date: 07/11/2017 CLINICAL DATA:  Cardiac arrest.  Stent placement. EXAM: PORTABLE CHEST 1 VIEW COMPARISON:  06/16/2017 FINDINGS: Chronic left lung collapse due to central obstructing mass. There is an endotracheal tube with tip at the clavicular heads. Porta catheter on the right with tip at the SVC level. The right lung is clear. No evidence of pneumothorax. No cardiomegaly. IMPRESSION: 1. New endotracheal tube in unremarkable position. 2. Known complete collapse of the left lung from hilar mass. Electronically Signed   By: Monte Fantasia M.D.   On: 07/16/2017 14:37    ASSESSMENT & PLAN:   # 76 year old male patient with metastatic sarcoma to the lung currently admitted to the hospital status post anaphylactic shock cardiac arrest chemotherapy.  # Metastatic sarcoma- [undifferentiated pleomorphic sarcoma]- status post day 2 of cycle #1 of Adriamycin- [place]; day #2. Overall poor prognosis.  # Anaphylactic shock/cardiac arrest secondary to Olaratumab- status post CPR. Extubated and off pressors now. Transferred out of the ICU..  # Non-STEMI- elevated troponins/EKG nonspecific changes. [place] cardiology consultation.   # Dr. Rogue Bussing had a long discussion the patient's family [wife, son, daughter Sister] yesterday regarding the difficult situation. They understand the prognosis is guarded. No family at bedside this morning during evaluation.  Will follow.   Lloyd Huger, MD   07/05/2017 3:41 PM

## 2017-07-05 NOTE — Progress Notes (Signed)
ANTICOAGULATION CONSULT NOTE - Initial Consult  Pharmacy Consult for heparin drip  Indication: chest pain/ACS  No Known Allergies  Patient Measurements: Height: 6\' 2"  (188 cm) Weight: 217 lb (98.4 kg) IBW/kg (Calculated) : 82.2  Vital Signs: Temp: 98.2 F (36.8 C) (07/14 0000) Temp Source: Oral (07/14 0000) BP: 125/64 (07/14 0300) Pulse Rate: 65 (07/14 0300)  Labs:  Recent Labs  06/30/2017 1050 07/19/2017 1405 07/04/17 0500 07/04/17 1454 07/04/17 1733 07/04/17 1833 07/04/17 2359 07/05/17 0321  HGB 15.7 18.4* 15.7  --   --   --   --   --   HCT 46.2 56.4* 47.7  --   --   --   --   --   PLT 274 296 281  --   --   --   --   --   APTT  --   --   --   --   --  26  --   --   LABPROT  --   --   --   --   --  14.7  --   --   INR  --   --   --   --   --  1.14  --   --   HEPARINUNFRC  --   --   --   --   --   --   --  0.65  CREATININE 0.99 1.03 1.90* 2.06*  --   --   --   --   TROPONINI  --  <0.03  --   --  4.68*  --  2.86*  --     Estimated Creatinine Clearance: 36 mL/min (A) (by C-G formula based on SCr of 2.06 mg/dL (H)).   Medical History: Past Medical History:  Diagnosis Date  . Cancer [facility]) 2007   Lung CA- Bilateral  . Coronary artery disease   . History of kidney stones   . Lung cancer ([place]) 07/16/2015  . Myocardial infarction [facility]) 2000   coronary stent  . Sarcoma [facility]) 2013   Right groin resection.   . Shortness of breath dyspnea   . Stroke [facility]) 2005    Assessment: Pharmacy consulted for heparin drip dosing and monitoring for ACS in 76 yo patient admitted to ICU. Troponin 4.68.   Goal of Therapy:  Heparin level 0.3-0.7 units/ml Monitor platelets by anticoagulation protocol: Yes   Plan:  Baseline labs ordered Give 4000 units bolus x 1 Start heparin infusion at 1150 units/hr Check anti-Xa level in 8 hours and daily while on heparin Continue to monitor H&H and platelets   7/14 0300 heparin level 0.65. Continue current regimen.  Recheck in 8 hours to  confirm.  Eloise Harman, PharmD, BCPS Clinical Pharmacist 07/05/2017 4:27 AM

## 2017-07-05 NOTE — Progress Notes (Signed)
Patient alert and responsive. Patient on 2L nasal canula. No distress noted during shift. Patient remains on chemo precautions. Will monitor.

## 2017-07-05 NOTE — Progress Notes (Signed)
Pt. Agitated, attempting to crawl out of bed, pulled leads off, pulled oxygen off, de- accessed portaccath, no bleeding noted. Pt. Re-oriented, leads placed on back, portacath cleansed and band aide placed over port a cath, nasal cannula re-applied. Pt. Offered to urinate.

## 2017-07-05 NOTE — Consult Note (Signed)
[facility] Consultation Note  Patient ID: Stephen [name], MRN: [id number], DOB/AGE: February 28, 1941 76 y.o. Admit date: 07/02/2017   Date of Consult: 07/05/2017 Primary Physician: Maryland Pink, MD Primary Cardiologist: Ubaldo Glassing  Chief Complaint:  Chief Complaint  Patient presents with  . Cardiac Arrest   Reason for Consult: cardiac arrest with myocardial infarction  HPI: 76 y.o. male with known coronary artery disease status post previous stenting in the past aortic aneurysm repair carotid endarterectomy and other peripheral vascular disease with previous stroke having undifferentiated lung cancer for which the patient had an anaphylactic shock and cardiovascular arrest. The patient had ACLS protocol during this period of time and did recover and is now hemodynamically stable with appropriate medication management with pressure support. EKG has shown normal sinus rhythm with inferior infarct age undetermined with lateral T wave changes. Troponin level is 4.68 consistent with non-ST elevation myocardial infarction. Currently the patient does not have any significant major symptoms and is recovering somewhat from this issue. Blood pressure is recovering somewhat and the likely non-ST elevation myocardial infarction is due to his anaphylactic shock.  Past Medical History:  Diagnosis Date  . Cancer [facility]) 2007   Lung CA- Bilateral  . Coronary artery disease   . History of kidney stones   . Lung cancer ([place]) 07/16/2015  . Myocardial infarction [facility]) 2000   coronary stent  . [facility]) 2013   Right groin resection.   . Shortness of breath dyspnea   . Stroke [facility]) 2005      Surgical History:  Past Surgical History:  Procedure Laterality Date  . ABDOMINAL AORTIC ANEURYSM REPAIR  10/28/2012   Dr Lucky Cowboy  . CAROTID ENDARTERECTOMY Right   . COLONOSCOPY  2013  . CYSTOSCOPY WITH LITHOLAPAXY N/A 04/23/2017   Procedure: CYSTOSCOPY WITH LITHOLAPAXY;  Surgeon: Nickie Retort, MD;   Location: [facility] ORS;  Service: Urology;  Laterality: N/A;  . ENDOBRONCHIAL ULTRASOUND N/A 08/08/2016   Procedure: ENDOBRONCHIAL ULTRASOUND;  Surgeon: Vilinda Boehringer, MD;  Location: [facility] ORS;  Service: Cardiopulmonary;  Laterality: N/A;  . EYE SURGERY Bilateral 2010  . HOLMIUM LASER APPLICATION N/A 03/26/9674   Procedure: HOLMIUM LASER APPLICATION;  Surgeon: Nickie Retort, MD;  Location: [facility] ORS;  Service: Urology;  Laterality: N/A;  . PORTA CATH INSERTION N/A 06/23/2017   Procedure: Glori Luis Cath Insertion;  Surgeon: Algernon Huxley, MD;  Location: [place] CV LAB;  Service: Cardiovascular;  Laterality: N/A;  . SKIN CANCER EXCISION Left   . THORACOTOMY Bilateral      Home Meds: Prior to Admission medications   Medication Sig Start Date End Date Taking? Authorizing Provider  albuterol (PROVENTIL HFA;VENTOLIN HFA) 108 (90 Base) MCG/ACT inhaler Inhale 2 puffs into the lungs every 6 (six) hours as needed for wheezing or shortness of breath. 09/23/16  Yes Schaevitz, Randall An, MD  Ascorbic Acid (VITAMIN C) 100 MG tablet Take 100 mg by mouth daily.   Yes [provider]  aspirin EC 81 MG tablet Take 81 mg by mouth every evening.    Yes [provider]  Cholecalciferol (VITAMIN D) 2000 units tablet Take 2,000 Units by mouth daily.   Yes [provider]  Coenzyme Q10 (CO Q 10) 100 MG CAPS Take 100 mg by mouth daily.   Yes [provider]  diphenhydrAMINE (BENADRYL) 25 MG tablet Take 25 mg by mouth daily as needed for allergies.   Yes [provider]  DiphenhydrAMINE HCl, Sleep, (SLEEP AID) 50 MG CAPS Take 50  mg by mouth at bedtime.   Yes [provider]  ipratropium-albuterol (DUONEB) 0.5-2.5 (3) MG/3ML SOLN Use one vial via nebulizer every 6 (six) hours as needed. Patient taking differently: Use one vial via nebulizer every 6 (six) hours as needed for shortness of breath 04/16/17  Yes Kasa, Maretta Bees, MD  lidocaine-prilocaine (EMLA) cream Apply 1  application topically as needed. 06/27/17  Yes Cammie Sickle, MD  Omega-3 Fatty Acids (FISH OIL) 1000 MG CAPS Take 1,000 mg by mouth daily.    Yes [provider]  ondansetron (ZOFRAN) 8 MG tablet Take 1 tablet (8 mg total) by mouth every 8 (eight) hours as needed for nausea or vomiting (start 3 days; after chemo). 06/27/17  Yes Cammie Sickle, MD  prochlorperazine (COMPAZINE) 10 MG tablet Take 1 tablet (10 mg total) by mouth every 6 (six) hours as needed for nausea or vomiting. 06/27/17  Yes Cammie Sickle, MD  tamsulosin (FLOMAX) 0.4 MG CAPS capsule Take 0.4 mg by mouth at bedtime.   Yes [provider]  Tiotropium Bromide Monohydrate (SPIRIVA RESPIMAT) 2.5 MCG/ACT AERS Inhale 2.5 mcg into the lungs 2 (two) times daily. Patient taking differently: Inhale 2.5 mcg into the lungs 2 (two) times daily as needed (shortness of breath).  06/10/17  Yes Flora Lipps, MD  vitamin B-12 (CYANOCOBALAMIN) 1000 MCG tablet Take 1,000 mcg by mouth daily.   Yes [provider]  AMBULATORY NON FORMULARY MEDICATION Medication Name: incentive spirometer Patient not taking: Reported on 06/28/2017 09/23/16   Vilinda Boehringer, MD    Inpatient Medications:  . insulin aspart  0-15 Units Subcutaneous Q4H  . ipratropium-albuterol  3 mL Nebulization Q6H  . mouth rinse  15 mL Mouth Rinse BID   . dexmedetomidine (PRECEDEX) IV infusion 0.199 mcg/kg/hr (07/05/17 0600)  . famotidine (PEPCID) IV Stopped (07/04/17 1657)  . heparin 1,150 Units/hr (07/05/17 0600)  . lactated ringers 50 mL/hr at 07/05/17 0600  . norepinephrine (LEVOPHED) Adult infusion 7.04 mcg/min (07/05/17 0600)    Allergies: No Known Allergies  Social History   Social History  . Marital status: Married    Spouse name: N/A  . Number of children: N/A  . Years of education: N/A   Occupational History  . Not on file.   Social History Main Topics  . Smoking status: Former Smoker    Quit date: 07/31/2003  .  Smokeless tobacco: Never Used  . Alcohol use No  . Drug use: No  . Sexual activity: Not on file   Other Topics Concern  . Not on file   Social History Narrative  . No narrative on file     Family History  Problem Relation Age of Onset  . Hematuria Neg Hx   . Prostate cancer Neg Hx      Review of Systems Positive for Cannot assess due to difficulty with conversation  Labs:  Recent Labs  07/14/2017 1405 07/04/17 1733 07/04/17 2359 07/05/17 0508  TROPONINI <0.03 4.68* 2.86* 2.17*   Lab Results  Component Value Date   WBC 15.6 (H) 07/05/2017   HGB 12.8 (L) 07/05/2017   HCT 38.1 (L) 07/05/2017   MCV 82.5 07/05/2017   PLT 201 07/05/2017    Recent Labs Lab 07/05/17 0508  NA 137  K 4.6  CL 102  CO2 27  BUN 35*  CREATININE 1.66*  CALCIUM 8.1*  PROT 5.1*  BILITOT 0.6  ALKPHOS 48  ALT 25  AST 56*  GLUCOSE 202*   Lab Results  Component  Value Date   CHOL 228 (H) 03/11/2013   HDL 42 03/11/2013   LDLCALC 155 (H) 03/11/2013   TRIG 209 (H) 07/12/2017   No results found for: DDIMER  Radiology/Studies:  Dg Chest 2 View  Result Date: 06/16/2017 CLINICAL DATA:  Dry cough since October 2017. History of bilateral lung cancer. EXAM: CHEST  2 VIEW COMPARISON:  Chest CT 03/05/2017 FINDINGS: There is complete opacification of the left hemithorax which is new since the prior recent chest CT from March. There is loss of volume in the left hemithorax with hyperinflation of the right lung. Possible density in the left hilum could be an enlarging left hilar massa causing obstruction of the left mainstem bronchus. Recommend followup chest CT with contrast for further evaluation. No right lung lesions. No obvious pleural effusion. Stable left rib changes. IMPRESSION: Complete opacification of the left hemithorax with a bronchial cut off sign involving the left mainstem bronchus likely due to enlarging left hilar mass. Recommend chest CT with contrast for further evaluation.  Electronically Signed   By: Marijo Sanes M.D.   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 Cough. 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 Prior left lower lobectomy and wedge resection in the left upper lobe. Prior right upper lobectomy. EXAM: CT CHEST WITH CONTRAST TECHNIQUE: Multidetector CT imaging of the chest was performed during intravenous contrast administration. CONTRAST:  34mL ISOVUE-300 IOPAMIDOL (ISOVUE-300) INJECTION 61% COMPARISON:  Multiple exams, including 03/05/2017 CT scan FINDINGS: Cardiovascular: The mass previously shown just below the left pulmonary artery appears to of invaded up into the pulmonary artery, causing extensive tumor thrombus in the left pulmonary artery as shown on image 104/5. The left pulmonary artery is not entirely occluded but is significantly narrowed. Coronary, aortic arch, and branch vessel atherosclerotic vascular disease. Leftward shift of cardiac and mediastinal structures. Mediastinum/Nodes: The left infrahilar mass is of similar density to the completely atelectatic surrounding left upper lobe and accordingly is difficult to measure, but is subjectively a believed to be increased in size compared to prior, and has now invaded the left pulmonary artery as described above. Moreover, there are low-density components of the peripheral left hemithorax which may be from loculated pleural fluid or pleural tumor, for example measuring up to 2 cm in thickness along the left upper thorax on image 49/2 posterolaterally. Small scattered mediastinal lymph nodes are increased in number compared to the prior exam. An index lymph node adjacent to the upper esophagus measures 8 mm in short axis on image 29/2, previously 3 mm. Small left eccentric pericardial effusion. Pericardial invasion by tumor not excluded. Lungs/Pleura: Complete atelectasis of the left  hemithorax attributed to occlusion of the left upper lobe bronchus and accordingly complete atelectasis of the left upper lobe. Ill-defined tumor along the infrahilar region, difficult to measure but subjectively increased from prior. Mild airway thickening in the right lung. Prior right upper lobectomy. Stable sub solid 6 by 5 mm right middle lobe pulmonary nodule on image 84/3. Upper Abdomen: Unremarkable Musculoskeletal: Old healed rib deformities.  Thoracic spondylosis. IMPRESSION: 1. Enlarging left infrahilar mass invades and severely narrows although does not totally occlude the left pulmonary artery. There is associated tumor thrombus in the left upper lobe pulmonary artery. The patient has had prior left lower lobectomy in the left upper lobe is completely collapsed with complete atelectasis of the remaining left lung parenchyma. There also  loculated low-density pleural lesions on the left probably from loculated if lesions, less likely from pleural tumor. 2. I do not see any filling defects in the right pulmonary arterial tree, although today's exam was performed as a general diagnostic CT and not as a dedicated pulmonary embolus CT angiography study, and accordingly may have some reduced sensitivity for smaller pulmonary emboli. 3. Left eccentric pericardial effusion, I cannot exclude early invasion of tumor into the pericardium. 4. Mediastinal lymph nodes have slightly enlarged and are increased in number, in these could be inflammatory or neoplastic. 5. Subsolid 5 by 6 mm right middle lobe pulmonary nodule is stable and merits surveillance. 6.  Aortic Atherosclerosis (ICD10-I70.0).  Coronary atherosclerosis. Critical Value/emergent results were called by telephone at the time of interpretation on 06/17/2017 at 4:54 pm to Dr. Charlaine Dalton , who verbally acknowledged these results. Dr. Rogue Bussing instructed me to release the patient, and plans to contact the patient himself. Electronically Signed    By: Van Clines M.D.   On: 06/17/2017 17:00   Dg Chest Port 1 View  Result Date: 06/30/2017 CLINICAL DATA:  Central line placement. On ventilator. Lung carcinoma. EXAM: PORTABLE CHEST 1 VIEW COMPARISON:  06/27/2017 FINDINGS: A new left jugular central venous catheter is seen with tip overlying the proximal SVC. A new nasogastric tube is also seen entering the stomach. Endotracheal tube and right jugular central venous catheter remain in appropriate position. Complete opacification of left hemithorax is again seen. Left lung remains clear. No pneumothorax visualized. IMPRESSION: New left jugular central venous catheter and enteric tube is in appropriate position. No pneumothorax visualized. Stable complete opacification of left hemithorax. Electronically Signed   By: Earle Gell M.D.   On: 06/25/2017 18:34   Dg Chest Port 1 View  Result Date: 06/23/2017 CLINICAL DATA:  Cardiac arrest.  Stent placement. EXAM: PORTABLE CHEST 1 VIEW COMPARISON:  06/16/2017 FINDINGS: Chronic left lung collapse due to central obstructing mass. There is an endotracheal tube with tip at the clavicular heads. Porta catheter on the right with tip at the SVC level. The right lung is clear. No evidence of pneumothorax. No cardiomegaly. IMPRESSION: 1. New endotracheal tube in unremarkable position. 2. Known complete collapse of the left lung from hilar mass. Electronically Signed   By: Monte Fantasia M.D.   On: 06/27/2017 14:37    EKG: Normal sinus rhythm with inferior infarct age undetermined and lateral ST changes  Weights: Filed Weights   07/13/2017 1439 07/05/17 0537  Weight: 98.4 kg (217 lb) 105 kg (231 lb 7.7 oz)     Physical Exam: Blood pressure (!) 131/57, pulse 79, temperature 98.3 F (36.8 C), temperature source Oral, resp. rate 11, height 6\' 2"  (1.88 m), weight 105 kg (231 lb 7.7 oz), SpO2 100 %. Body mass index is 29.72 kg/m. General: Well developed, well nourished, in no acute distress. Head eyes ears  nose throat: Normocephalic, atraumatic, sclera non-icteric, no xanthomas, nares are without discharge. No apparent thyromegaly and/or mass  Lungs: Normal respiratory effort.  no wheezes, no rales, Few rhonchi.  Heart: RRR with normal S1 S2. no murmur gallop, no rub, PMI is normal size and placement, carotid upstroke normal without bruit, jugular venous pressure is normal Abdomen: Soft, non-tender, non-distended with normoactive bowel sounds. No hepatomegaly. No rebound/guarding. No obvious abdominal masses. Abdominal aorta is normal size without bruit Extremities: Trace edema. no cyanosis, no clubbing, no ulcers  Peripheral : 2+ bilateral upper extremity pulses, 2+ bilateral femoral pulses, 2+ bilateral dorsal pedal pulse  Neuro: Not Alert and oriented. No facial asymmetry. No focal deficit. Moves all extremities spontaneously. Musculoskeletal: Normal muscle tone without kyphosis Psych:  Does not Responds to questions appropriately with a normal affect.    Assessment: 76 year old male with known peripheral vascular disease coronary artery disease previous stroke having lung cancer and anaphylactic shock requiring ACLS protocol with non-ST elevation myocardial infarction due to this issue now recovering  Plan: 1. Continue supportive care from anaphylactic shock 2. Blood pressure support with Precedex until able to wean support 3. No further cardiac diagnostics due to primary cause of the non-ST elevation myocardial infarction from anaphylactic shock 4. Begin ambulation if able without restriction and would reinstate medication management from cardiovascular standpoint as able thereafter  Signed, Corey Skains M.D. [facility] 07/05/2017, 6:50 AM

## 2017-07-05 NOTE — Progress Notes (Signed)
1430: Patient became very agitated, heart rate went to 140 and sustained. Patient was attempting to pull out foley catheter and telemetry. Dr. Posey Pronto notified, ordered 2 mg IV haldol.   15:20 Dr. Posey Pronto notified that patient HR had decreased to 110's, but patient was shaking and appeared in pain and distress. Patient's O2 sats found to be 77-82 on 2 liters of 2. O2 increased to 5 liters and Dr. Edwina Barth came to see patient. IV morphine and PO tylenol given to patient.  Chest xray ordered for patient.  1630: Patient's O2 sats in the low 90s, shaking has decreased significantly. Patient is resting.

## 2017-07-05 NOTE — Plan of Care (Signed)
Problem: SLP Dysphagia Goals Goal: Misc Dysphagia Goal Pt will safely tolerate po diet of least restrictive consistency w/ no overt s/s of aspiration noted by Staff/pt/family x3 sessions.

## 2017-07-05 NOTE — Progress Notes (Signed)
PT Cancellation Note  Patient Details Name: Stephen [name] MRN: [id number] DOB: October 16, 1941   Cancelled Treatment:    Reason Eval/Treat Not Completed: Medical issues which prohibited therapy (Consult received and chart reviewed.  Per discussion with primary RN, patient now with elevated HR (sustaining 140s) and increasing agitation/restlessness.  Will hold at this time and re-attempt next date as medically appropriate.)   [name] H. Owens Shark, PT, DPT, NCS 07/05/17, 3:02 PM [phone]

## 2017-07-06 ENCOUNTER — Inpatient Hospital Stay: Payer: Medicare Other

## 2017-07-06 DIAGNOSIS — G9340 Encephalopathy, unspecified: Secondary | ICD-10-CM

## 2017-07-06 LAB — CBC
HEMATOCRIT: 41.9 % (ref 40.0–52.0)
HEMOGLOBIN: 13.8 g/dL (ref 13.0–18.0)
MCH: 27.9 pg (ref 26.0–34.0)
MCHC: 32.9 g/dL (ref 32.0–36.0)
MCV: 84.6 fL (ref 80.0–100.0)
Platelets: 175 10*3/uL (ref 150–440)
RBC: 4.95 MIL/uL (ref 4.40–5.90)
RDW: 13.8 % (ref 11.5–14.5)
WBC: 11.4 10*3/uL — ABNORMAL HIGH (ref 3.8–10.6)

## 2017-07-06 LAB — BASIC METABOLIC PANEL
Anion gap: 9 (ref 5–15)
BUN: 32 mg/dL — ABNORMAL HIGH (ref 6–20)
CALCIUM: 8.7 mg/dL — AB (ref 8.9–10.3)
CHLORIDE: 103 mmol/L (ref 101–111)
CO2: 28 mmol/L (ref 22–32)
CREATININE: 1.06 mg/dL (ref 0.61–1.24)
GFR calc Af Amer: >60 mL/min (ref >60)
GFR calc non Af Amer: >60 mL/min (ref >60)
GLUCOSE: 163 mg/dL — AB (ref 65–99)
Potassium: 4.5 mmol/L (ref 3.5–5.1)
Sodium: 140 mmol/L (ref 135–145)

## 2017-07-06 MED ORDER — HYDROMORPHONE HCL 1 MG/ML IJ SOLN
2.0000 mg | INTRAMUSCULAR | Status: DC | PRN
  Administered 2017-07-06 – 2017-07-07 (×7): 2 mg via INTRAVENOUS
  Filled 2017-07-06 (×7): qty 2

## 2017-07-06 MED ORDER — METHYLPREDNISOLONE SODIUM SUCC 125 MG IJ SOLR
60.0000 mg | Freq: Four times a day (QID) | INTRAMUSCULAR | Status: DC
  Administered 2017-07-06 – 2017-07-07 (×4): 60 mg via INTRAVENOUS
  Filled 2017-07-06 (×4): qty 2

## 2017-07-06 NOTE — Plan of Care (Signed)
Problem: Nutrition: Goal: Dietary intake will improve Outcome: Not Progressing Patient unable to swallow at this time.

## 2017-07-06 NOTE — Progress Notes (Signed)
Mable Fill was paged to see another patient and the  Nurse asked  If I could also check on Mr. Vizcarrondo and his family since both  Patients were the nurses. Mr. Zaccone family just needed support Because he was not doing to good. So I stopped by and spoke with his family to offer a word of prayer and encouragement. His wife was happy and she stated that her pastor had visited As well.

## 2017-07-06 NOTE — Consult Note (Signed)
STROKE TEAM PROGRESS NOTE   HISTORY OF PRESENT ILLNESS (per record) 76 year old male with a PMHx of metastatic sarcoma of lungs, status post left-sided lung resection, MI, COPD, who presents to the emergency department as a cardiac arrest/CODE BLUE.According to report given by the infusion nurse the patient was receiving his first dose of chemotherapy when he slumped over and became unresponsive. They checked her pulse and could not find one so they called a CODE BLUE.  Patient was given CPR for 20 minutes PEA arrest with return of pulses and brought to the ED by EMS. Appears a code blue was called again with CPR and defibrillation with return of pulses while in the ED. Patient was extubated on 07/04/2017.Marland Kitchen He required vasopressors while in the ICU with multiple organ failure.. Patient was improved post extubation and,per notes, was mildly confused but alert and talking expressing "pain all over". He was transferred to the floor on the 14th but had worsening resp status with increased oxygen need and cognitive decline on the 14th with agitation, continuing encephalopathy.   SUBJECTIVE (INTERVAL HISTORY) His family is at bedside. Patient has been agitated, still encephalopathic. Family is very concerned because patient is not moving his right side and does not seem to be improving, not talking and not recognizing family members. They would like to proceed with MRI brain.   OBJECTIVE Temp:  [97.8 F (36.6 C)-98.4 F (36.9 C)] 97.8 F (36.6 C) (07/15 0340) Pulse Rate:  [105-114] 108 (07/15 0800) Cardiac Rhythm: Sinus tachycardia (07/15 0700) Resp:  [15-18] 16 (07/15 0800) BP: (109-136)/(59-87) 130/81 (07/15 0800) SpO2:  [83 %-98 %] 98 % (07/15 0800) Weight:  [214 lb 8.1 oz (97.3 kg)] 214 lb 8.1 oz (97.3 kg) (07/15 0256)  CBC:  Recent Labs Lab 06/22/2017 1050  07/05/17 0508 07/06/17 0518  WBC 5.1  < > 15.6* 11.4*  NEUTROABS 3.5  --   --   --   HGB 15.7  < > 12.8* 13.8  HCT 46.2  < > 38.1*  41.9  MCV 82.9  < > 82.5 84.6  PLT 274  < > 201 175  < > = values in this interval not displayed.  Basic Metabolic Panel:  Recent Labs Lab 07/04/17 1733 07/05/17 0508 07/06/17 0518  NA  --  137 140  K  --  4.6 4.5  CL  --  102 103  CO2  --  27 28  GLUCOSE  --  202* 163*  BUN  --  35* 32*  CREATININE  --  1.66* 1.06  CALCIUM  --  8.1* 8.7*  MG 1.6* 2.4  --   PHOS 5.8* 4.4  --     Lipid Panel:    Component Value Date/Time   CHOL 228 (H) 03/11/2013 1013   TRIG 209 (H) 07/04/2017 1823   TRIG 155 03/11/2013 1013   HDL 42 03/11/2013 1013   VLDL 31 03/11/2013 1013   LDLCALC 155 (H) 03/11/2013 1013   HgbA1c: No results found for: HGBA1C Urine Drug Screen: No results found for: LABOPIA, COCAINSCRNUR, LABBENZ, AMPHETMU, THCU, LABBARB  Alcohol Level No results found for: [facility]  IMAGING  Dg Chest 1 View  Result Date: 07/05/2017 CLINICAL DATA:  Shortness of Breath EXAM: CHEST 1 VIEW COMPARISON:  07/05/2017 FINDINGS: Right Port-A-Cath remains in place, unchanged. Complete opacification of the left hemithorax. No confluent opacity on the right. No change. IMPRESSION: No significant change since prior study. Electronically Signed   By: Rolm Baptise M.D.  On: 07/05/2017 16:27   Dg Chest Port 1 View  Result Date: 07/05/2017 CLINICAL DATA:  Acute respiratory failure.  Followup exam. EXAM: PORTABLE CHEST 1 VIEW COMPARISON:  07/10/2017 FINDINGS: No changing complete consolidation of the left hemithorax with mediastinal shift to the left. Right lung is essentially clear. The endotracheal tube and nasogastric tube and left internal jugular central venous line have been removed since the prior study. Right internal jugular Port-A-Cath is stable. IMPRESSION: 1. No acute findings.  Right lung remains essentially clear. 2. Persistent opacification of the left hemithorax with volume loss. 3. Status post removal of all support apparatus with the exception of the right sided Port-A-Cath. Electronically  Signed   By: Lajean Manes M.D.   On: 07/05/2017 08:03    Elderly white gentleman, not intubated, snoring, opens eyes briefly and grimaces to sternal rub, does not follow commands, non verbal, PERRL, conjugate midline gaze, face appears symmetric, tongue midline, right hemiparesis, moving left side spontaneously and purposefully, increased tone right LE.    ASSESSMENT/PLAN Mr. JOESEPH VERVILLE is a 76 y.o. male with PMHx of metastatic sarcoma of lungs, status post left-sided lung resection, MI, COPD with cardiopulmonary arrest for 20 minutes during chemotherapy treatment and then again while in the ED. Improved post extubation but then with worsening respiratory status on the 14th requiring more oxygen and now agitated and encephalopathic. Suggest MRI of the brain to evaluate for hypoxic injury or stroke. Will follow up after MIi brain for further recommendations. Continue management of any metabolic/ toxic/ infectious causes of patient's encephalopathy. Poor overall prognosis. Patient is DNR. He is at increased risk for aspiration.   Hospital day # [facility]  To contact Neurology provider, please refer to [email or web address]

## 2017-07-06 NOTE — Progress Notes (Signed)
Pt. Restless, attempting to get out of bed, pt. Repositioned and re-oriented.

## 2017-07-06 NOTE — Progress Notes (Signed)
[facility] Encounter Note  Patient: Stephen [name] / Admit Date: 07/06/2017 / Date of Encounter: 07/06/2017, 7:06 AM   Subjective: Patient is continuing to have respiratory distress and significant discomfort. Patient is still hemodynamically stable. No further incident of rhythm disturbances after anaphylactic shock and reaction to chemotherapy.  Review of Systems:  Cannot assess due to obtundation  Objective: Telemetry: Normal sinus rhythm Physical Exam: Blood pressure 133/87, pulse (!) 114, temperature 97.8 F (36.6 C), temperature source Oral, resp. rate 18, height 6\' 2"  (1.88 m), weight 97.3 kg (214 lb 8.1 oz), SpO2 97 %. Body mass index is 27.54 kg/m. General: Well developed, well nourished, sleeping Head: Normocephalic, atraumatic, sclera non-icteric, no xanthomas, nares are without discharge. Neck: No apparent masses Lungs: Normal respirations with some wheezes, some rhonchi, no rales , no crackles decreased breath sounds  Heart: Regular rate and rhythm, normal S1 S2, no murmur, no rub, no gallop, PMI is normal size and placement, carotid upstroke normal without bruit, jugular venous pressure normal Abdomen: Soft, non-tender, non-distended with normoactive bowel sounds. No hepatosplenomegaly. Abdominal aorta is normal size without bruit Extremities: Trace edema, no clubbing, no cyanosis, no ulcers,  Peripheral: 2+ radial, 2+ femoral, 2+ dorsal pedal pulses Neuro: Not Alert and oriented. Moves all extremities spontaneously. Psych:  Does not Responds to questions appropriately with a normal affect.   Intake/Output Summary (Last 24 hours) at 07/06/17 0706 Last data filed at 07/06/17 0538  Gross per 24 hour  Intake           183.24 ml  Output              720 ml  Net          -536.76 ml    Inpatient Medications:  . enoxaparin (LOVENOX) injection  40 mg Subcutaneous Q24H  . ipratropium-albuterol  3 mL Nebulization Q6H  . mouth rinse  15 mL Mouth Rinse  BID   Infusions:   Labs:  Recent Labs  07/04/17 1733 07/05/17 0508 07/06/17 0518  NA  --  137 140  K  --  4.6 4.5  CL  --  102 103  CO2  --  27 28  GLUCOSE  --  202* 163*  BUN  --  35* 32*  CREATININE  --  1.66* 1.06  CALCIUM  --  8.1* 8.7*  MG 1.6* 2.4  --   PHOS 5.8* 4.4  --     Recent Labs  06/30/2017 1405 07/05/17 0508  AST 41 56*  ALT 18 25  ALKPHOS 87 48  BILITOT 0.8 0.6  PROT 6.1* 5.1*  ALBUMIN 3.0* 2.7*    Recent Labs  06/28/2017 1050  07/05/17 0508 07/06/17 0518  WBC 5.1  < > 15.6* 11.4*  NEUTROABS 3.5  --   --   --   HGB 15.7  < > 12.8* 13.8  HCT 46.2  < > 38.1* 41.9  MCV 82.9  < > 82.5 84.6  PLT 274  < > 201 175  < > = values in this interval not displayed.  Recent Labs  07/12/2017 1405 07/04/17 1733 07/04/17 2359 07/05/17 0508  TROPONINI <0.03 4.68* 2.86* 2.17*   Invalid input(s): POCBNP No results for input(s): HGBA1C in the last 72 hours.   Weights: Filed Weights   06/28/2017 1439 07/05/17 0537 07/06/17 0256  Weight: 98.4 kg (217 lb) 105 kg (231 lb 7.7 oz) 97.3 kg (214 lb 8.1 oz)     Radiology/Studies:  Dg Chest 1  View  Result Date: 07/05/2017 CLINICAL DATA:  Shortness of Breath EXAM: CHEST 1 VIEW COMPARISON:  07/05/2017 FINDINGS: Right Port-A-Cath remains in place, unchanged. Complete opacification of the left hemithorax. No confluent opacity on the right. No change. IMPRESSION: No significant change since prior study. Electronically Signed   By: Rolm Baptise M.D.   On: 07/05/2017 16:27   Dg Chest 2 View  Result Date: 06/16/2017 CLINICAL DATA:  Dry cough since October 2017. History of bilateral lung cancer. EXAM: CHEST  2 VIEW COMPARISON:  Chest CT 03/05/2017 FINDINGS: There is complete opacification of the left hemithorax which is new since the prior recent chest CT from March. There is loss of volume in the left hemithorax with hyperinflation of the right lung. Possible density in the left hilum could be an enlarging left hilar massa  causing obstruction of the left mainstem bronchus. Recommend followup chest CT with contrast for further evaluation. No right lung lesions. No obvious pleural effusion. Stable left rib changes. IMPRESSION: Complete opacification of the left hemithorax with a bronchial cut off sign involving the left mainstem bronchus likely due to enlarging left hilar mass. Recommend chest CT with contrast for further evaluation. Electronically Signed   By: Marijo Sanes M.D.   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 Cough. 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 Prior left lower lobectomy and wedge resection in the left upper lobe. Prior right upper lobectomy. EXAM: CT CHEST WITH CONTRAST TECHNIQUE: Multidetector CT imaging of the chest was performed during intravenous contrast administration. CONTRAST:  45mL ISOVUE-300 IOPAMIDOL (ISOVUE-300) INJECTION 61% COMPARISON:  Multiple exams, including 03/05/2017 CT scan FINDINGS: Cardiovascular: The mass previously shown just below the left pulmonary artery appears to of invaded up into the pulmonary artery, causing extensive tumor thrombus in the left pulmonary artery as shown on image 104/5. The left pulmonary artery is not entirely occluded but is significantly narrowed. Coronary, aortic arch, and branch vessel atherosclerotic vascular disease. Leftward shift of cardiac and mediastinal structures. Mediastinum/Nodes: The left infrahilar mass is of similar density to the completely atelectatic surrounding left upper lobe and accordingly is difficult to measure, but is subjectively a believed to be increased in size compared to prior, and has now invaded the left pulmonary artery as described above. Moreover, there are low-density components of the peripheral left hemithorax which may be from loculated pleural fluid or pleural tumor, for example measuring up to 2 cm  in thickness along the left upper thorax on image 49/2 posterolaterally. Small scattered mediastinal lymph nodes are increased in number compared to the prior exam. An index lymph node adjacent to the upper esophagus measures 8 mm in short axis on image 29/2, previously 3 mm. Small left eccentric pericardial effusion. Pericardial invasion by tumor not excluded. Lungs/Pleura: Complete atelectasis of the left hemithorax attributed to occlusion of the left upper lobe bronchus and accordingly complete atelectasis of the left upper lobe. Ill-defined tumor along the infrahilar region, difficult to measure but subjectively increased from prior. Mild airway thickening in the right lung. Prior right upper lobectomy. Stable sub solid 6 by 5 mm right middle lobe pulmonary nodule on image 84/3. Upper Abdomen: Unremarkable Musculoskeletal: Old healed rib deformities.  Thoracic spondylosis. IMPRESSION: 1. Enlarging left infrahilar mass invades and severely narrows although does not totally occlude the left pulmonary artery. There is associated tumor thrombus in the left upper lobe pulmonary artery.  The patient has had prior left lower lobectomy in the left upper lobe is completely collapsed with complete atelectasis of the remaining left lung parenchyma. There also loculated low-density pleural lesions on the left probably from loculated if lesions, less likely from pleural tumor. 2. I do not see any filling defects in the right pulmonary arterial tree, although today's exam was performed as a general diagnostic CT and not as a dedicated pulmonary embolus CT angiography study, and accordingly may have some reduced sensitivity for smaller pulmonary emboli. 3. Left eccentric pericardial effusion, I cannot exclude early invasion of tumor into the pericardium. 4. Mediastinal lymph nodes have slightly enlarged and are increased in number, in these could be inflammatory or neoplastic. 5. Subsolid 5 by 6 mm right middle lobe pulmonary  nodule is stable and merits surveillance. 6.  Aortic Atherosclerosis (ICD10-I70.0).  Coronary atherosclerosis. Critical Value/emergent results were called by telephone at the time of interpretation on 06/17/2017 at 4:54 pm to Dr. Charlaine Dalton , who verbally acknowledged these results. Dr. Rogue Bussing instructed me to release the patient, and plans to contact the patient himself. Electronically Signed   By: Van Clines M.D.   On: 06/17/2017 17:00   Dg Chest Port 1 View  Result Date: 07/05/2017 CLINICAL DATA:  Acute respiratory failure.  Followup exam. EXAM: PORTABLE CHEST 1 VIEW COMPARISON:  07/21/2017 FINDINGS: No changing complete consolidation of the left hemithorax with mediastinal shift to the left. Right lung is essentially clear. The endotracheal tube and nasogastric tube and left internal jugular central venous line have been removed since the prior study. Right internal jugular Port-A-Cath is stable. IMPRESSION: 1. No acute findings.  Right lung remains essentially clear. 2. Persistent opacification of the left hemithorax with volume loss. 3. Status post removal of all support apparatus with the exception of the right sided Port-A-Cath. Electronically Signed   By: Lajean Manes M.D.   On: 07/05/2017 08:03   Dg Chest Port 1 View  Result Date: 07/12/2017 CLINICAL DATA:  Central line placement. On ventilator. Lung carcinoma. EXAM: PORTABLE CHEST 1 VIEW COMPARISON:  07/02/2017 FINDINGS: A new left jugular central venous catheter is seen with tip overlying the proximal SVC. A new nasogastric tube is also seen entering the stomach. Endotracheal tube and right jugular central venous catheter remain in appropriate position. Complete opacification of left hemithorax is again seen. Left lung remains clear. No pneumothorax visualized. IMPRESSION: New left jugular central venous catheter and enteric tube is in appropriate position. No pneumothorax visualized. Stable complete opacification of left  hemithorax. Electronically Signed   By: Earle Gell M.D.   On: 07/16/2017 18:34   Dg Chest Port 1 View  Result Date: 06/26/2017 CLINICAL DATA:  Cardiac arrest.  Stent placement. EXAM: PORTABLE CHEST 1 VIEW COMPARISON:  06/16/2017 FINDINGS: Chronic left lung collapse due to central obstructing mass. There is an endotracheal tube with tip at the clavicular heads. Porta catheter on the right with tip at the SVC level. The right lung is clear. No evidence of pneumothorax. No cardiomegaly. IMPRESSION: 1. New endotracheal tube in unremarkable position. 2. Known complete collapse of the left lung from hilar mass. Electronically Signed   By: Monte Fantasia M.D.   On: 07/11/2017 14:37     Assessment and Recommendation  76 y.o. male with known coronary artery disease peripheral vascular disease status post previous stroke with acute non-ST elevation myocardial infarction after severe reaction to the chemotherapy with significant metastatic lung cancer having pain and other issues 1. Patient with  poor long-term prognosis with no current evidence of significant symptoms of cardiovascular disease and/or myocardial infarction 2. No additional medication management or other treatments for non-ST elevation myocardial infarction 3. Further consideration of palliative care 4. Call if further questions  Signed, Serafina Royals M.D. FACC

## 2017-07-06 NOTE — Progress Notes (Signed)
Pt's daughter called she was updated on pts condition. Daughter stated she would be coming to the hospital this A.M.

## 2017-07-06 NOTE — Progress Notes (Signed)
Pt. Slept off and on throughout the night. Pt. Was restless with agitation, medicated multiple time with effective results for short periods of time. Pt. Was moved from room 257 to 252.CCMD notified as well as family. Pt. Placed on low bed and fall mats put down for protection.

## 2017-07-06 NOTE — Progress Notes (Signed)
Pt. HR increases with agitation or activity into the 140's non sustained.

## 2017-07-06 NOTE — Progress Notes (Signed)
[place] at [place] NAME: Stephen [name]    MR#:  [id number]  DATE OF BIRTH:  May 26, 1941  SUBJECTIVE:  CHIEF COMPLAINT:   Chief Complaint  Patient presents with  . Cardiac Arrest   Pt has Has been very agitated requiring sedating medications. Family is at bedside   REVIEW OF SYSTEMS:  Review of Systems  Unable to perform ROS: Mental status change  Neurological: Negative for dizziness, speech change, focal weakness and headaches.  Endo/Heme/Allergies: Does not bruise/bleed easily.  Psychiatric/Behavioral: Negative for depression.    DRUG ALLERGIES:  No Known Allergies VITALS:  Blood pressure 130/81, pulse (!) 108, temperature 97.8 F (36.6 C), temperature source Oral, resp. rate 16, height 6\' 2"  (1.88 m), weight 214 lb 8.1 oz (97.3 kg), SpO2 98 %. PHYSICAL EXAMINATION:  Physical Exam  Constitutional: He is well-developed, well-nourished, and in no distress.  HENT:  Head: Normocephalic and atraumatic.  Eyes: Pupils are equal, round, and reactive to light. Conjunctivae and EOM are normal.  Neck: Normal range of motion. Neck supple. No tracheal deviation present. No thyromegaly present.  Cardiovascular: Normal rate, regular rhythm and normal heart sounds.   Pulmonary/Chest: Effort normal and breath sounds normal. No respiratory distress. He has no wheezes. He exhibits no tenderness.  Abdominal: Soft. Bowel sounds are normal. He exhibits no distension. There is no tenderness.  Musculoskeletal: Normal range of motion.  Neurological: No cranial nerve deficit.  Pt confused  Skin: Skin is warm and dry. No rash noted.  Psychiatric: Mood and affect normal.   LABORATORY PANEL:  Male CBC  Recent Labs Lab 07/06/17 0518  WBC 11.4*  HGB 13.8  HCT 41.9  PLT 175   ------------------------------------------------------------------------------------------------------------------ Chemistries   Recent Labs Lab 07/05/17 0508  07/06/17 0518  NA 137 140  K 4.6 4.5  CL 102 103  CO2 27 28  GLUCOSE 202* 163*  BUN 35* 32*  CREATININE 1.66* 1.06  CALCIUM 8.1* 8.7*  MG 2.4  --   AST 56*  --   ALT 25  --   ALKPHOS 48  --   BILITOT 0.6  --    RADIOLOGY:  Dg Chest 1 View  Result Date: 07/05/2017 CLINICAL DATA:  Shortness of Breath EXAM: CHEST 1 VIEW COMPARISON:  07/05/2017 FINDINGS: Right Port-A-Cath remains in place, unchanged. Complete opacification of the left hemithorax. No confluent opacity on the right. No change. IMPRESSION: No significant change since prior study. Electronically Signed   By: Rolm Baptise M.D.   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 Which is chronic.  *Acute encephalopathy could be related ischemic encephalopathy supportive care Family wanting to know his prognosis in neurology consult has been obtained They're recommending MRI of the brain  * Cardiopulmonary arrest with an anaphylactic shock due to new chemotherapy with LATRUVO;  - Currently stable  *Elevated troponin due to demand ischemia no MI  * ARF - prerenal vs ATN improving -Renal function now normal - avoid nephrotoxins  * Recurrent undifferentiated pleomorphic sarcoma - follows with  Dr. Rogue Bussing. Prognosis poor  I have discussed with them regarding CODE STATUS he is a DO NOT RESUSCITATE now  All the records are reviewed and case discussed with Care Management/Social Worker. Management plans discussed with the patient, nursing,  intensivist and they are in agreement.  CODE STATUS: DNR  TOTAL TIME TAKING CARE OF THIS PATIENT: 32 minutes.   More than 50% of the time was spent in counseling/coordination of care: YES  POSSIBLE D/C IN 2-3 DAYS, DEPENDING ON CLINICAL CONDITION.   Dustin Flock M.D on 07/06/2017 at 2:06 PM  Between 7am to 6pm - Pager - [phone]  After 6pm go to [email or web address] - Proofreader  [facility]  Office  [phone]  CC: Primary care physician; [place] Pink, MD  Note: This dictation was prepared with Dragon dictation along with smaller phrase technology. Any transcriptional errors that result from this process are unintentional.

## 2017-07-06 NOTE — Progress Notes (Signed)
Advanced care plan.  Purpose of the Encounter: CODE STATUS  Parties in Attendance:Patient and family  Patient's Decision Capacity: not intact  Subjective/Patient's story: Pt is 76 y.o with metastatic sarcoma s/p CPR, now on 5l of oxygen , now confused and delirius   Objective/Medical story: failure to improve, possible anoxic brain injury,d/w with family regarding code and goal of care, they are aggreable to DNR    Goals of care determination: DNR    CODE STATUS: DNR   Time spent discussing advanced care planning: 15 minutes

## 2017-07-06 NOTE — Progress Notes (Signed)
PT Cancellation Note  Patient Details Name: Stephen [name] MRN: [id number] DOB: September 02, 1941   Cancelled Treatment:    Reason Eval/Treat Not Completed: Medical issues which prohibited therapy.  Has declined medically and nursing asking PT to consider whether PT should even be ordered now.  Will defer this decision to staff seeing about this tomorrow, and will hold for today.   Ramond Dial 07/06/2017, 1:12 PM   Mee Hives, PT MS Acute Rehab Dept. Number: [place] [place]

## 2017-07-06 NOTE — Progress Notes (Signed)
Pt. Having moments of apnea at least 3-5 seconds. Family at bedside.

## 2017-07-06 NOTE — Plan of Care (Addendum)
Problem: Safety: Goal: Ability to remain free from injury will improve Outcome: Progressing Patient in low bed, bed exit alarm is on, family supervision, hourly rounding completed on patient, floor mats in place. Will continue to assess and monitor.   Problem: Health Behavior/Discharge Planning: Goal: Ability to manage health-related needs will improve Outcome: Not Progressing Patient responds to voice/pain but has dysarthria. Patient not following commands well and has poor attention/concentration. MRI was ordered and completed for patient and was positive for stroke. MD notified family. Palliative consult has been ordered for patient.   Problem: Pain Managment: Goal: General experience of comfort will improve Outcome: Not Progressing -Patient was very agitated this morning and was frequently pulling his oxygen off. Oxygen put back in place and patient and family were educated on importance of keeping oxygen on.   -Ativan given for agitation.   -Patient attempting to cough but cough is nonproductive. Suction was set up at bedside and oral care preformed. Will continue to monitor.   -Patient grimacing and moaning when attempting to cough, patient also having some slight leg tremors and seemed as if he in pain. 2mg  Morphine given per MAR.   -Patient still grimacing and moaning after morphine given. Family stated that morphine did not seem to help patient yesterday. MD notified and new orders placed for 2mg  dilaudid q2hr PRN. Dilaudid given. Patient resting and seemed to be more comfortable upon reassessment, family at bedside.     Problem: Physical Regulation: Goal: Ability to maintain clinical measurements within normal limits will improve Outcome: Not Progressing MRI positive for stroke.   Problem: Nutrition: Goal: Adequate nutrition will be maintained Outcome: Not Progressing Patient unable to swallow at this time.

## 2017-07-06 NOTE — Progress Notes (Signed)
   [place] at [place] NAME: Stephen [name]    MR#:  [id number]  DATE OF BIRTH:  Mar 20, 1941  SUBJECTIVE:  CHIEF COMPLAINT:   Chief Complaint  Patient presents with  . Cardiac Arrest   Patient transfer from the ICU and has been very confused    REVIEW OF SYSTEMS:  Review of Systems  Unable to perform ROS: Mental status change    DRUG ALLERGIES:  No Known Allergies VITALS:  Blood pressure 130/81, pulse (!) 108, temperature 97.8 F (36.6 C), temperature source Oral, resp. rate 16, height 6\' 2"  (1.88 m), weight 214 lb 8.1 oz (97.3 kg), SpO2 98 %. PHYSICAL EXAMINATION:  Physical Exam  Constitutional: He is well-developed, well-nourished, and in no distress.  HENT:  Head: Normocephalic and atraumatic.  Eyes: Pupils are equal, round, and reactive to light. Conjunctivae and EOM are normal.  Neck: Normal range of motion. Neck supple. No tracheal deviation present. No thyromegaly present.  Cardiovascular: Normal rate, regular rhythm and normal heart sounds.   Pulmonary/Chest: Effort normal and breath sounds normal. No respiratory distress. He has no wheezes. He exhibits no tenderness.  Abdominal: Soft. Bowel sounds are normal. He exhibits no distension. There is no tenderness.  Musculoskeletal: Normal range of motion.  Neurological: No cranial nerve deficit.  Agitated  Skin: Skin is warm and dry. No rash noted.  Psychiatric: Mood normal. He is agitated.   LABORATORY PANEL:  Male CBC  Recent Labs Lab 07/06/17 0518  WBC 11.4*  HGB 13.8  HCT 41.9  PLT 175   ------------------------------------------------------------------------------------------------------------------ Chemistries   Recent Labs Lab 07/05/17 0508 07/06/17 0518  NA 137 140  K 4.6 4.5  CL 102 103  CO2 27 28  GLUCOSE 202* 163*  BUN 35* 32*  CREATININE 1.66* 1.06  CALCIUM 8.1* 8.7*  MG 2.4  --   AST 56*  --   ALT 25  --   ALKPHOS 48  --   BILITOT 0.6  --      RADIOLOGY:  Dg Chest 1 View  Result Date: 07/05/2017 CLINICAL DATA:  Shortness of Breath EXAM: CHEST 1 VIEW COMPARISON:  07/05/2017 FINDINGS: Right Port-A-Cath remains in place, unchanged. Complete opacification of the left hemithorax. No confluent opacity on the right. No change. IMPRESSION: No significant change since prior study. Electronically Signed   By: Rolm Baptise M.D.   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 At this point I will provide him with supportive measures only when necessary IV haloperidol If needed can use Ativan as well.     Additional time spent 35 minutes  Dustin Flock M.D on 07/06/2017 at 2:32 PM  Between 7am to 6pm - Pager - [phone]  After 6pm go to [email or web address] - Proofreader  [facility]  Office  [phone]  CC: Primary care physician; [place] Pink, MD  Note: This dictation was prepared with Dragon dictation along with smaller phrase technology. Any transcriptional errors that result from this process are unintentional.

## 2017-07-06 NOTE — Progress Notes (Signed)
Dr. Jannifer Franklin made aware of pt's apenic breathing. Oral care performed.

## 2017-07-07 DIAGNOSIS — S06309A Unspecified focal traumatic brain injury with loss of consciousness of unspecified duration, initial encounter: Secondary | ICD-10-CM

## 2017-07-07 DIAGNOSIS — R451 Restlessness and agitation: Secondary | ICD-10-CM

## 2017-07-07 DIAGNOSIS — J9690 Respiratory failure, unspecified, unspecified whether with hypoxia or hypercapnia: Secondary | ICD-10-CM

## 2017-07-07 DIAGNOSIS — I63119 Cerebral infarction due to embolism of unspecified vertebral artery: Secondary | ICD-10-CM

## 2017-07-07 DIAGNOSIS — Z7189 Other specified counseling: Secondary | ICD-10-CM

## 2017-07-07 DIAGNOSIS — C499 Malignant neoplasm of connective and soft tissue, unspecified: Secondary | ICD-10-CM

## 2017-07-07 DIAGNOSIS — Z515 Encounter for palliative care: Secondary | ICD-10-CM

## 2017-07-07 DIAGNOSIS — I639 Cerebral infarction, unspecified: Secondary | ICD-10-CM

## 2017-07-07 MED ORDER — HALOPERIDOL LACTATE 2 MG/ML PO CONC
0.5000 mg | ORAL | Status: DC | PRN
  Filled 2017-07-07: qty 0.3

## 2017-07-07 MED ORDER — SODIUM CHLORIDE 0.9 % IV SOLN: 250.0000 mL | INTRAVENOUS | Status: DC | PRN

## 2017-07-07 MED ORDER — BIOTENE DRY MOUTH MT LIQD: 15.0000 mL | OROMUCOSAL | Status: DC | PRN

## 2017-07-07 MED ORDER — GLYCOPYRROLATE 0.2 MG/ML IJ SOLN
0.2000 mg | INTRAMUSCULAR | Status: DC | PRN
Start: 2017-07-07 — End: 2017-07-08
  Filled 2017-07-07: qty 1

## 2017-07-07 MED ORDER — SODIUM CHLORIDE 0.9% FLUSH
3.0000 mL | Freq: Two times a day (BID) | INTRAVENOUS | Status: DC
  Administered 2017-07-07: 13:00:00 3 mL via INTRAVENOUS

## 2017-07-07 MED ORDER — POLYVINYL ALCOHOL 1.4 % OP SOLN
1.0000 [drp] | Freq: Four times a day (QID) | OPHTHALMIC | Status: DC | PRN
  Filled 2017-07-07: qty 15

## 2017-07-07 MED ORDER — GLYCOPYRROLATE 0.2 MG/ML IJ SOLN
0.4000 mg | Freq: Three times a day (TID) | INTRAMUSCULAR | Status: DC
  Administered 2017-07-07 (×2): 0.4 mg via INTRAVENOUS
  Filled 2017-07-07 (×4): qty 2

## 2017-07-07 MED ORDER — SODIUM CHLORIDE 0.9 % IV SOLN
2.0000 mg/h | INTRAVENOUS | Status: DC
  Administered 2017-07-07: 11:00:00 2 mg/h via INTRAVENOUS
  Filled 2017-07-07: qty 5

## 2017-07-07 MED ORDER — SODIUM CHLORIDE 0.9% FLUSH: 3.0000 mL | INTRAVENOUS | Status: DC | PRN

## 2017-07-07 MED ORDER — HALOPERIDOL 0.5 MG PO TABS
0.5000 mg | ORAL_TABLET | ORAL | Status: DC | PRN
  Filled 2017-07-07: qty 1

## 2017-07-07 MED ORDER — LORAZEPAM 2 MG/ML IJ SOLN
1.0000 mg | Freq: Four times a day (QID) | INTRAMUSCULAR | Status: DC
  Administered 2017-07-07: 1 mg via INTRAVENOUS
  Filled 2017-07-07: qty 1

## 2017-07-07 MED ORDER — HALOPERIDOL LACTATE 5 MG/ML IJ SOLN: 0.5000 mg | INTRAMUSCULAR | Status: DC | PRN

## 2017-07-07 MED ORDER — GLYCOPYRROLATE 0.2 MG/ML IJ SOLN
0.2000 mg | INTRAMUSCULAR | Status: DC | PRN
  Filled 2017-07-07: qty 1

## 2017-07-07 MED ORDER — HYDROMORPHONE BOLUS VIA INFUSION
2.0000 mg | INTRAVENOUS | Status: DC | PRN
  Administered 2017-07-07: 2 mg via INTRAVENOUS
  Filled 2017-07-07: qty 2

## 2017-07-07 MED ORDER — LORAZEPAM 2 MG/ML PO CONC: 1.0000 mg | ORAL | Status: DC | PRN

## 2017-07-07 MED ORDER — GLYCOPYRROLATE 1 MG PO TABS
1.0000 mg | ORAL_TABLET | ORAL | Status: DC | PRN
  Filled 2017-07-07: qty 1

## 2017-07-07 MED ORDER — LORAZEPAM 1 MG PO TABS: 1.0000 mg | ORAL_TABLET | ORAL | Status: DC | PRN

## 2017-07-07 MED ORDER — LORAZEPAM 2 MG/ML IJ SOLN: 1.0000 mg | INTRAMUSCULAR | Status: DC | PRN

## 2017-07-07 MED ORDER — HYDROMORPHONE HCL 1 MG/ML IJ SOLN
2.0000 mg | INTRAMUSCULAR | Status: DC | PRN
  Administered 2017-07-07: 2 mg via INTRAVENOUS
  Filled 2017-07-07: qty 2

## 2017-07-08 ENCOUNTER — Encounter: Payer: Self-pay | Admitting: Internal Medicine

## 2017-07-10 ENCOUNTER — Other Ambulatory Visit: Payer: Medicare Other

## 2017-07-10 ENCOUNTER — Ambulatory Visit: Payer: Medicare Other

## 2017-07-10 DIAGNOSIS — Z515 Encounter for palliative care: Secondary | ICD-10-CM

## 2017-07-10 DIAGNOSIS — I639 Cerebral infarction, unspecified: Secondary | ICD-10-CM

## 2017-07-14 ENCOUNTER — Encounter: Payer: Self-pay | Admitting: Internal Medicine

## 2017-07-18 ENCOUNTER — Encounter: Payer: Self-pay | Admitting: Internal Medicine

## 2017-07-23 NOTE — Progress Notes (Signed)
Pt to be transferred to 1C room 105, report called to [place], [place]

## 2017-07-23 NOTE — Progress Notes (Signed)
Pt transferred to 105 via bed.

## 2017-07-23 NOTE — Death Summary Note (Signed)
[facility] - [place] at [place], 76 y.o., DOB 08/04/1941, MRN [id number]. 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 male with a known history of Recurrent lung cancer started on new chemotherapy today  With latruvo and doxorubicin, had cardiac arrest with   PEA following first one drop off chemotherapy with new medicine. Patient was resuscitated and subsequently intubated. He was intubated for brief amount of time. Subsequently extubated. Postextubation he was confused and agitated. Further workup with MRI showed that he had infarcts in the watershed distribution of his brain due to the hypotension. Further discussions were held with the family due to his sarcoma patient was made comfort care. He was given supportive care with with patient passing away family was at bedside at the time of his death.             Total Time in preparing paper work, data evaluation and todays exam - 35 minutes  Dustin Flock M.D on 07/09/2017 at 3:09 [facility]  [facility]   Office  [phone]

## 2017-07-23 NOTE — Progress Notes (Signed)
Pt continues to be restless and moaning.  Dilaudid 2mg  and Ativan 1mg  IV given.  Family at bedside.  Will continue to monitor.

## 2017-07-23 NOTE — Progress Notes (Signed)
Pt. Family has many questions about pt's, health and apenic breathing. Explained to family could be decreased related to medication or his current medical conditions. They were also questioning why he was not receiving IV fluids, it was explained patient does not need fluids at this time. Then they questioned how long patient might have to live, it was explained we did not know, then family went back to the apenic  Breathing and once again it was explained, yes, pt has slowed respirations and period of apenia but it could be related to various reasons. Family were saying their good byes to patient telling him it was ok to go if he was ready. They just want him to be comfortable and not in any pain. Pt. Has been medicated once and does become agitated/ restless when medication begins to wear off.

## 2017-07-23 NOTE — Progress Notes (Signed)
Pt very agitated, moaning and holding his head, BP elevate.  Dilaudid 2mg  iv given for pain, will monitor.

## 2017-07-23 NOTE — Progress Notes (Signed)
CH responded to an OR for End of life. Pt was unable to respond. Wife, Son, and Daughter were bedside. Wife stated that the Pt had taken a turn for the worse. Pt has been transitioned to comfort care. Family had discussions around the quality of life of the Pt. Family felt he would not want to live in a helpless state of life. [place] provided a calming presence. Pt will be transferred to a room on 1-C when a bed is available.  CH will follow up periodically.    23-Jul-2017 1000  Clinical Encounter Type  Visited With Patient and family together;Health care provider  Visit Type Follow-up;Spiritual support;Patient actively dying  Referral From Nurse  Consult/Referral To Chaplain  Spiritual Encounters  Spiritual Needs Grief support

## 2017-07-23 NOTE — Progress Notes (Signed)
SLP Cancellation Note  Patient Details Name: Stephen [name] MRN: [id number] DOB: August 30, 1941   Cancelled treatment:       Reason Eval/Treat Not Completed: Medical issues which prohibited therapy;Patient not medically ready (chart reviewed; NSG consulted) Per Neurology note, pt's MRI of the brain revealed multiple bilateral acute infarcts. Due to increased agitation and less responsiveness, medications given. Family has decided upon comfort care.  ST services will be available for any further education as needed while admitted. Recommend pleasure oral intake in safe consistency when pt is FULLY alert and appropriate for oral intake - strict aspiration precautions. Otherwise, hold po's if pt is at too high a risk for aspiration d/t lethargy/mental status. Recommend oral care for hygiene, comfort.    Orinda Kenner, [place], CCC-SLP Watson,Katherine 07-24-2017, 3:30 PM

## 2017-07-23 NOTE — Discharge Summary (Deleted)
[facility] - [place] at [place], 76 y.o., DOB 1941/01/31, MRN [id number]. 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 male with a known history of Recurrent lung cancer started on new chemotherapy today  With latruvo and doxorubicin, had cardiac arrest with   PEA following first one drop off chemotherapy with new medicine. Patient was resuscitated and subsequently intubated. He was intubated for brief amount of time. Subsequently extubated. Postextubation he was confused and agitated. Further workup with MRI showed that he had infarcts in the watershed distribution of his brain due to the hypotension. Further discussions were held with the family due to his sarcoma patient was made comfort care. He was given supportive care with with patient passing away family was at bedside at the time of his death.             Total Time in preparing paper work, data evaluation and todays exam - 35 minutes  Dustin Flock M.D on 07/09/2017 at 3:09 [facility]  [facility]   Office  [phone]

## 2017-07-23 NOTE — Progress Notes (Signed)
CH made a follow up visit. Pt is transferred to room 105. Family was in good spirits and seem to be coping well in a rough situation. CH provided the ministry of conversation and prayer. Stephen [name] will make follow up visits as the day progresses.    19-Jul-2017 1500  Clinical Encounter Type  Visited With Patient and family together  Visit Type Follow-up  Consult/Referral To Chaplain  Spiritual Encounters  Spiritual Needs Grief support

## 2017-07-23 NOTE — Progress Notes (Signed)
[place] at [place] NAME: Stephen [name]    MR#:  [id number]  DATE OF BIRTH:  September 14, 1941  SUBJECTIVE:  CHIEF COMPLAINT:   Chief Complaint  Patient presents with  . Cardiac Arrest   Patient made comfort care currently on Dilaudid drip REVIEW OF SYSTEMS:  Review of Systems  Unable to perform ROS: Mental status change    DRUG ALLERGIES:  No Known Allergies VITALS:  Blood pressure (!) 176/111, pulse (!) 120, temperature (!) 97.5 F (36.4 C), temperature source Oral, resp. rate 18, height 6\' 2"  (1.88 m), weight 237 lb (107.5 kg), SpO2 95 %. PHYSICAL EXAMINATION:  Physical Exam  Constitutional: He has a sickly appearance.  HENT:  Head: Normocephalic and atraumatic.  Eyes: Pupils are equal, round, and reactive to light. Conjunctivae and EOM are normal.  Neck: Normal range of motion. Neck supple. No tracheal deviation present. No thyromegaly present.  Cardiovascular: Normal rate, regular rhythm and normal heart sounds.   Pulmonary/Chest: Effort normal and breath sounds normal. No respiratory distress. He has no wheezes. He exhibits no tenderness.  Abdominal: Soft. Bowel sounds are normal. He exhibits no distension. There is no tenderness.  Musculoskeletal: Normal range of motion.  Neurological: No cranial nerve deficit.  Unresponsive  Skin: Skin is warm and dry. No rash noted.  Psychiatric: Affect normal.  Sedated   LABORATORY PANEL:  Male CBC  Recent Labs Lab 07/06/17 0518  WBC 11.4*  HGB 13.8  HCT 41.9  PLT 175   ------------------------------------------------------------------------------------------------------------------ Chemistries   Recent Labs Lab 07/05/17 0508 07/06/17 0518  NA 137 140  K 4.6 4.5  CL 102 103  CO2 27 28  GLUCOSE 202* 163*  BUN 35* 32*  CREATININE 1.66* 1.06  CALCIUM 8.1* 8.7*  MG 2.4  --   AST 56*  --   ALT 25  --   ALKPHOS 48  --   BILITOT 0.6  --    RADIOLOGY:  Mr Brain Wo  Contrast  Result Date: 07/06/2017 CLINICAL DATA:  The patient became suddenly unresponsive during chemotherapy. Lung cancer. Acute infarct. EXAM: MRI HEAD WITHOUT CONTRAST TECHNIQUE: Multiplanar, multiecho pulse sequences of the brain and surrounding structures were obtained without intravenous contrast. COMPARISON:  MRI brain 03/29/2013. FINDINGS: Brain: The diffusion-weighted images demonstrate areas of acute nonhemorrhagic infarction involving the posterior and inferior right cerebellum. Areas of acute nonhemorrhagic infarction are present within the occipital lobes bilaterally. The left-sided infarcts extend over the convexity involving cortical areas within a watershed distribution. A more remote infarct is present in the left frontal operculum, including the insular cortex. Atrophy and white matter disease is chronic. Ex vacuo dilation of the left lateral ventricle is noted. Vascular: Flow is present in the major intracranial arteries, including the vertebral and basilar arteries. Skull and upper cervical spine: The skullbase is within normal limits. Sinuses/Orbits: The paranasal sinuses mastoid air cells are clear. Bilateral lens replacements are present. The globes and orbits are otherwise unremarkable. IMPRESSION: 1. Acute/subacute nonhemorrhagic infarcts involving the right cerebellum, bilateral occipital lobes, and diffusely over the left frontal and parietal convexity. T2 hyperintensities suggests these infarcts are at least several hours all. 2. Remote right MCA territory infarcts with volume loss as described. 3. Moderate atrophy and white matter disease. Electronically Signed   By: San Morelle M.D.   On: 07/06/2017 16:05   US Carotid Bilateral  Result Date: 07-13-17 CLINICAL DATA:  Stroke.  Bilateral endarterectomy. EXAM: BILATERAL CAROTID DUPLEX ULTRASOUND TECHNIQUE: Pearline Cables scale  imaging, color Doppler and duplex ultrasound were performed of bilateral carotid and vertebral arteries in  the neck. COMPARISON:  MRI 07/06/2017. FINDINGS: Criteria: Quantification of carotid stenosis is based on velocity parameters that correlate the residual internal carotid diameter with NASCET-based stenosis levels, using the diameter of the distal internal carotid lumen as the denominator for stenosis measurement. The following velocity measurements were obtained: RIGHT ICA:  57/18 cm/sec CCA:  87/56 cm/sec SYSTOLIC ICA/CCA RATIO:  1.1 DIASTOLIC ICA/CCA RATIO:  0.8 ECA:  0 cm/sec LEFT ICA:  150/37 cm/sec CCA:  43/32 cm/sec SYSTOLIC ICA/CCA RATIO:  2.3 DIASTOLIC ICA/CCA RATIO:  2.7 ECA:  54 cm/sec RIGHT CAROTID ARTERY: Mild diffuse right common carotid, carotid bifurcation, proximal ICA atherosclerotic vascular disease. ICA stent noted to be widely patent. 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 RIGHT VERTEBRAL ARTERY:  Patent with antegrade flow. LEFT CAROTID ARTERY: Mild diffuse left common carotid, carotid bifurcation, proximal ICA atherosclerotic vascular disease. Using flow velocity and velocity ratios degree of stenosis 50-69%. LEFT VERTEBRAL ARTERY:  Patent with antegrade flow. IMPRESSION: 1. Mild diffuse right common carotid, carotid bifurcation, proximal ICA atherosclerotic vascular disease. Right ICA stent is widely patent. Degree of stenosis less than 50%. 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 2. Mild diffuse left common carotid, carotid bifurcation, proximal ICA sclerotic vascular disease. Degree of stenosis 50-69%. 3. Vertebral arteries are patent with antegrade flow. 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 with progressive lung disease in the setting of collapsed left upper lobe lung with acute cardiac arrest probably from acute anaphylactic reaction from new chemotherapy with prolonged cardiac arrest severe metabolic acidosis and cardiogenic shock  * Acute  respiratory failure: required short ventilatory support - now extubated,  - Patient on 5 L of oxygen -Before meals made comfort care continuing Dilaudid drip  *Acute encephalopathy Due to acute diffuse CVA due to watershed infarct as result of cardiopulmonary arrest  * Cardiopulmonary arrest with an anaphylactic shock due to new chemotherapy with LATRUVO;  - Compared To  *Elevated troponin due to demand ischemia no MI  * ARF - prerenal vs ATN improving Patient comfort care  * Recurrent undifferentiated pleomorphic sarcoma - follows with Dr. Rogue Bussing. Prognosis poor he is made comfort care now  Comfort care measures  All the records are reviewed and case discussed with Care Management/Social Worker. Management plans discussed with the patient, nursing, intensivist and they are in agreement.  CODE STATUS: DNR  TOTAL TIME TAKING CARE OF THIS PATIENT: 25 minutes.   More than 50% of the time was spent in counseling/coordination of care: YES  POSSIBLE D/C IN 2-3 DAYS, DEPENDING ON CLINICAL CONDITION.   Dustin Flock M.D on Aug 02, 2017 at 2:00 PM  Between 7am to 6pm - Pager - [phone]  After 6pm go to [email or web address] - Proofreader  [facility] [place] [facility]  Office  [phone]  CC: Primary care physician; [place] Pink, MD  Note: This dictation was prepared with Dragon dictation along with smaller phrase technology. Any transcriptional errors that result from this process are unintentional.

## 2017-07-23 NOTE — Progress Notes (Signed)
Stephen [name]   DOB:09-25-1941   NF#:[id number]    Subjective: Significant decline in clinical status since yesterday. MRI brain showed bilateral acute cortical stroke. Given the agitation/poor control of pain/ the context of poor prognosis- family interested in comfort care. Patient had been significantly agitated. However since starting the Dilaudid drip is more calm.  ROS: cannot be done.   Objective:  Vitals:   July 09, 2017 0500 Jul 09, 2017 0758  BP: (!) 149/71 (!) 176/111  Pulse: 97 (!) 120  Resp:  18  Temp:  (!) 97.5 F (36.4 C)     Intake/Output Summary (Last 24 hours) at 09-Jul-2017 1659 Last data filed at 07/06/17 2155  Gross per 24 hour  Intake                0 ml  Output                0 ml  Net                0 ml    GENERAL sleepy no distress and comfortable. O2 EYES: no pallor or icterus OROPHARYNX: no thrush or ulceration. NECK: supple, no masses felt LUNGS: decreased breath sounds to auscultation at bases and  No wheeze or crackles HEART/CVS: regular rate & rhythm and no murmurs; No lower extremity edema ABDOMEN: abdomen soft, tender  on deep palpation. and normal bowel sounds Musculoskeletal:no cyanosis of digits and no clubbing  PSYCH: sleepy/drowsy [on dilaudid drip] NEURO: cannot be examined.  SKIN:  no rashes or significant lesions   Labs:  Lab Results  Component Value Date   WBC 11.4 (H) 07/06/2017   HGB 13.8 07/06/2017   HCT 41.9 07/06/2017   MCV 84.6 07/06/2017   PLT 175 07/06/2017   NEUTROABS 3.5 07/01/2017    Lab Results  Component Value Date   NA 140 07/06/2017   K 4.5 07/06/2017   CL 103 07/06/2017   CO2 28 07/06/2017    Studies:  Mr Brain Wo Contrast  Result Date: 07/06/2017 CLINICAL DATA:  The patient became suddenly unresponsive during chemotherapy. Lung cancer. Acute infarct. EXAM: MRI HEAD WITHOUT CONTRAST TECHNIQUE: Multiplanar, multiecho pulse sequences of the brain and surrounding structures were obtained without intravenous contrast.  COMPARISON:  MRI brain 03/29/2013. FINDINGS: Brain: The diffusion-weighted images demonstrate areas of acute nonhemorrhagic infarction involving the posterior and inferior right cerebellum. Areas of acute nonhemorrhagic infarction are present within the occipital lobes bilaterally. The left-sided infarcts extend over the convexity involving cortical areas within a watershed distribution. A more remote infarct is present in the left frontal operculum, including the insular cortex. Atrophy and white matter disease is chronic. Ex vacuo dilation of the left lateral ventricle is noted. Vascular: Flow is present in the major intracranial arteries, including the vertebral and basilar arteries. Skull and upper cervical spine: The skullbase is within normal limits. Sinuses/Orbits: The paranasal sinuses mastoid air cells are clear. Bilateral lens replacements are present. The globes and orbits are otherwise unremarkable. IMPRESSION: 1. Acute/subacute nonhemorrhagic infarcts involving the right cerebellum, bilateral occipital lobes, and diffusely over the left frontal and parietal convexity. T2 hyperintensities suggests these infarcts are at least several hours all. 2. Remote right MCA territory infarcts with volume loss as described. 3. Moderate atrophy and white matter disease. Electronically Signed   By: San Morelle M.D.   On: 07/06/2017 16:05   US Carotid Bilateral  Result Date: 07/09/2017 CLINICAL DATA:  Stroke.  Bilateral endarterectomy. EXAM: BILATERAL CAROTID DUPLEX ULTRASOUND TECHNIQUE: Pearline Cables scale imaging,  color Doppler and duplex ultrasound were performed of bilateral carotid and vertebral arteries in the neck. COMPARISON:  MRI 07/06/2017. FINDINGS: Criteria: Quantification of carotid stenosis is based on velocity parameters that correlate the residual internal carotid diameter with NASCET-based stenosis levels, using the diameter of the distal internal carotid lumen as the denominator for stenosis  measurement. The following velocity measurements were obtained: RIGHT ICA:  57/18 cm/sec CCA:  23/76 cm/sec SYSTOLIC ICA/CCA RATIO:  1.1 DIASTOLIC ICA/CCA RATIO:  0.8 ECA:  0 cm/sec LEFT ICA:  150/37 cm/sec CCA:  28/31 cm/sec SYSTOLIC ICA/CCA RATIO:  2.3 DIASTOLIC ICA/CCA RATIO:  2.7 ECA:  54 cm/sec RIGHT CAROTID ARTERY: Mild diffuse right common carotid, carotid bifurcation, proximal ICA atherosclerotic vascular disease. ICA stent noted to be widely patent. 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 RIGHT VERTEBRAL ARTERY:  Patent with antegrade flow. LEFT CAROTID ARTERY: Mild diffuse left common carotid, carotid bifurcation, proximal ICA atherosclerotic vascular disease. Using flow velocity and velocity ratios degree of stenosis 50-69%. LEFT VERTEBRAL ARTERY:  Patent with antegrade flow. IMPRESSION: 1. Mild diffuse right common carotid, carotid bifurcation, proximal ICA atherosclerotic vascular disease. Right ICA stent is widely patent. Degree of stenosis less than 50%. 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 2. Mild diffuse left common carotid, carotid bifurcation, proximal ICA sclerotic vascular disease. Degree of stenosis 50-69%. 3. Vertebral arteries are patent with antegrade flow. 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 I agree with palliative care/symptom control at this time. Overall prognosis is poor.  Discussed with palliative care nurse practitioner. Also discussed the patient's wife- likely expected skin the order of  hours to days.   Cammie Sickle, MD Jul 11, 2017  4:59 PM

## 2017-07-23 NOTE — Progress Notes (Signed)
Oral care performed, pericare performed, new linens, pt. Repositioned. Pt. Able to follow simple command such as, when told to turn to right he he attempts to help turn to right when CNA and I were going to pull pt. Up in bed he bent left leg and attempted to help push himself up in bed. Pt. Has a distant gaze, he does not look at you when your speaking, he also continues to hold the front of his head. Family states he does that when he has a head ache at home.  Family continues to be at bedside.

## 2017-07-23 NOTE — Progress Notes (Signed)
[place] did a follow up visit with the family of the Pt who is under comfort care.

## 2017-07-23 NOTE — Progress Notes (Signed)
Nutrition Brief Note  Chart reviewed. Pt now transitioning to comfort care.  No further nutrition interventions warranted at this time.  Please re-consult as needed.   Satira Anis. Jacaden Forbush, MS, RD LDN Inpatient Clinical Dietitian Pager [phone]

## 2017-07-23 NOTE — Progress Notes (Signed)
Pallative care in to see pt. Remains restless, moaning

## 2017-07-23 NOTE — Consult Note (Signed)
Consultation Note Date: 07-15-2017   Patient Name: Stephen [name]  DOB: 06/07/1941  MRN: [id number]  Age / Sex: 76 y.o., male  PCP: [place] Pink, MD Referring Physician: Dustin Flock, MD  Reason for Consultation: Establishing goals of care, Non pain symptom management, Pain control and Terminal Care  HPI/Patient Profile: 76 y.o. male with past medical history of stage 4 pleomorphic sarcoma who was admitted on 07/13/2017 after suffering anaphylactic shock and PEA arrest in chemotherapy.  He required resuscitation for and extended period of time.  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  Clinical Assessment and Goals of Care:  I have reviewed medical records including EPIC notes, labs and imaging, received report from the care team, assessed the patient and then met at the bedside along with his wife and family to discuss diagnosis prognosis, GOC, EOL wishes, disposition and options.  On my initial exam the patient was moving in pain, moaning loudly and having increased work of breathing.  The family was anxious and very concerned.  We discussed exam findings and recent test results including the MRI.  Comfort measures were recommended and the family quickly accepted.    We discussed the types of medications to be used (a continuous infusion of pain medication, anti-anxiety medication and something to dry secretions.).    I explained to the family that I don't believe he has much time left - likely hours to days.   I also let them know that we will continue to reassess him day by day..  I visited the patient and family again 3 hours later.  Patient appears much more comfortable on comfort meds.  Family is very Patent attorney.  I explained again that his time is likely short.   The family was encouraged to call with questions or concerns.  PMT will continue to support holistically.   Primary Decision Maker:  NEXT  OF KIN wife    SUMMARY OF RECOMMENDATIONS    Full comfort measures.  Code Status/Advance Care Planning:  DNR    Symptom Management:   Dilaudid infusion and prn boluses for comfort   Ativan 1 mg q 6 hours  Robinul .4 mg IV TID  Additional Recommendations (Limitations, Scope, Preferences):  Full Comfort Care  Palliative Prophylaxis:   Aspiration, Delirium Protocol, Eye Care, Frequent Pain Assessment and Oral Care  Psycho-social/Spiritual:   Desire for further Chaplaincy support: yes   Prognosis:   Hours - Days  Discharge Planning: Anticipated Hospital Death      Primary Diagnoses: Present on Admission: . Cardiopulmonary arrest ([place])   I have reviewed the medical record, interviewed the patient and family, and examined the patient. The following aspects are pertinent.  Past Medical History:  Diagnosis Date  . Cancer [facility]) 2007   Lung CA- Bilateral  . Coronary artery disease   . History of kidney stones   . Lung cancer ([place]) 07/16/2015  . Myocardial infarction [facility]) 2000   coronary stent  . Sarcoma [facility]) 2013   Right groin resection.   . Shortness of breath  dyspnea   . Stroke [facility]) 2005   Social History   Social History  . Marital status: Married    Spouse name: N/A  . Number of children: N/A  . Years of education: N/A   Social History Main Topics  . Smoking status: Former Smoker    Quit date: 07/31/2003  . Smokeless tobacco: Never Used  . Alcohol use No  . Drug use: No  . Sexual activity: Not Asked   Other Topics Concern  . None   Social History Narrative  . None   Family History  Problem Relation Age of Onset  . Hematuria Neg Hx   . Prostate cancer Neg Hx    Scheduled Meds: . glycopyrrolate  0.4 mg Intravenous TID  . LORazepam  1 mg Intravenous Q6H  . mouth rinse  15 mL Mouth Rinse BID  . sodium chloride flush  3 mL Intravenous Q12H   Continuous Infusions: . sodium chloride    . HYDROmorphone 2 mg/hr (July 21, 2017 1100)   PRN  Meds:.sodium chloride, acetaminophen, antiseptic oral rinse, glycopyrrolate **OR** glycopyrrolate **OR** glycopyrrolate, haloperidol **OR** haloperidol **OR** haloperidol lactate, HYDROmorphone, HYDROmorphone (DILAUDID) injection, lip balm, LORazepam **OR** LORazepam **OR** LORazepam, nitroGLYCERIN, [DISCONTINUED] ondansetron **OR** ondansetron (ZOFRAN) IV, polyvinyl alcohol, sodium chloride flush No Known Allergies Review of Systems doesn't speak  Physical Exam Well developed male, Appears to be actively dying Resp - no relaxed, apneic, ++ crackles/gurgling cv - irreg + murmur Abd.  Soft, nt, nd   Vital Signs: BP (!) 176/111 (BP Location: Right Arm)   Pulse (!) 120   Temp (!) 97.5 F (36.4 C) (Oral)   Resp 18   Ht _0  (1.88 m)   Wt 107.5 kg (237 lb)   SpO2 95%   BMI 30.43 kg/m  Pain Assessment: 0-10 POSS *See Group Information*: 1-Acceptable,Awake and alert Pain Score: 0-No pain   SpO2: SpO2: 95 % O2 Device:SpO2: 95 % O2 Flow Rate: .O2 Flow Rate (L/min): 5 L/min  IO: Intake/output summary:  Intake/Output Summary (Last 24 hours) at 21-Jul-2017 1145 Last data filed at 07/06/17 2155  Gross per 24 hour  Intake                0 ml  Output                0 ml  Net                0 ml    LBM: Last BM Date: 07/02/17 Baseline Weight: Weight: 98.4 kg (217 lb) Most recent weight: Weight: 107.5 kg (237 lb)     Palliative Assessment/Data:   Flowsheet Rows     Most Recent Value  Intake Tab  Referral Department  Hospitalist  Unit at Time of Referral  Cardiac/Telemetry Unit  Palliative Care Primary Diagnosis  Cardiac  Date Notified  07/06/17  Palliative Care Type  New Palliative care  Reason for referral  Clarify Goals of Care, End of Life Care Assistance, Non-pain Symptom, Pain  Clinical Assessment  Palliative Performance Scale Score  20%  Pain Max last 24 hours  8  Pain Min Last 24 hours  2  Dyspnea Max Last 24 Hours  6  Dyspnea Min Last 24 hours  2  Anxiety Max Last 24  Hours  8  Anxiety Min Last 24 Hours  2  Psychosocial & Spiritual Assessment  Palliative Care Outcomes  Patient/Family meeting held?  Yes  Who was at the meeting?  patient, wife, children present  Palliative  Care Outcomes  Improved pain interventions, Improved non-pain symptom therapy, Clarified goals of care, Provided end of life care assistance, Changed to focus on comfort      Time In: 9:00 Time Out: 9:30 Time In:  11:30 Time Out:  12:10 Time Total: 70 min. Greater than 50%  of this time was spent counseling and coordinating care related to the above assessment and plan.  Signed by: Florentina Jenny, PA-C Palliative Medicine Pager: [phone]  Please contact Palliative Medicine Team phone at [phone] for questions and concerns.  For individual provider: See Shea Evans

## 2017-07-23 NOTE — Progress Notes (Signed)
Patient with MRI of the brain showing multiple bilateral acute infarcts.  Questions from family addressed.  They have decided upon comfort care.  No neurological intervention required at this time.    Alexis Goodell, MD Neurology [phone]

## 2017-07-23 NOTE — Progress Notes (Addendum)
Pt pronounced by Bretta Bang RN and Rowe Robert RN at [phone]. Patient has 0 respirations, no pulse, no heartbeat, no breath sounds. Dr. Edwina Barth notified, nursing supervisor Kennyth Lose was made aware. Nurse filled out funeral home release form. Pt will have postmortem care done by the nursing staff as well as the family per request. Pt is a full release from [employer]

## 2017-07-23 NOTE — Progress Notes (Signed)
Pt. Continues to have periods of apenia throughout the night.

## 2017-07-23 DEATH — deceased

## 2017-07-24 ENCOUNTER — Other Ambulatory Visit: Payer: Medicare Other

## 2017-07-24 ENCOUNTER — Ambulatory Visit: Payer: Medicare Other

## 2017-07-24 ENCOUNTER — Ambulatory Visit: Payer: Medicare Other | Admitting: Internal Medicine

## 2017-07-31 ENCOUNTER — Other Ambulatory Visit: Payer: Medicare Other

## 2017-07-31 ENCOUNTER — Ambulatory Visit: Payer: Medicare Other

## 2017-08-06 ENCOUNTER — Other Ambulatory Visit: Payer: Self-pay | Admitting: Nurse Practitioner

## 2017-08-26 ENCOUNTER — Ambulatory Visit: Payer: Medicare Other

## 2017-08-27 ENCOUNTER — Ambulatory Visit: Payer: Medicare Other | Admitting: Radiation Oncology

## 2017-08-29 ENCOUNTER — Other Ambulatory Visit: Payer: Medicare Other

## 2017-08-29 ENCOUNTER — Ambulatory Visit: Payer: Medicare Other | Admitting: Internal Medicine

## 2018-01-11 IMAGING — CT CT CHEST W/O CM
2 of 3 series · 17 of 46 positions shown, 19 images · non-contrast
Comparison: CT 01/05/2016, PET-CT 06/28/2014

CLINICAL DATA: Adenocarcinoma of the LEFT lower lobe with
lobectomy. Personal history of sarcoma LEFT thigh with lung
metastasis.

EXAM:
CT CHEST WITHOUT CONTRAST
TECHNIQUE: Multidetector CT imaging of the chest was performed following the
standard protocol without IV contrast.

[Series 2: routine chest wo · axial · 0.68mm/px · z∈[-522,-252]mm · 14 of 62 slices shown, 16 images]
[im 4/62  soft-tissue]
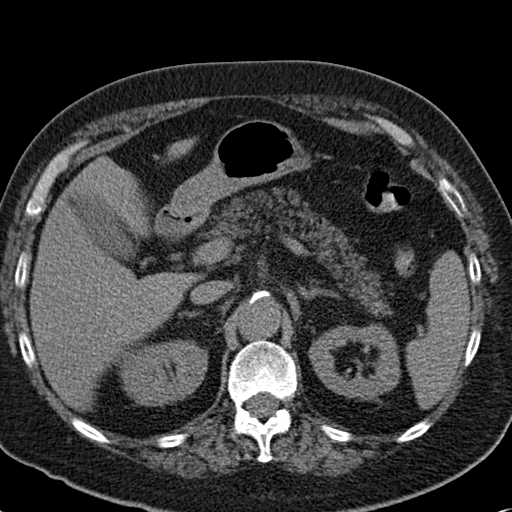
[im 4/62  bone]
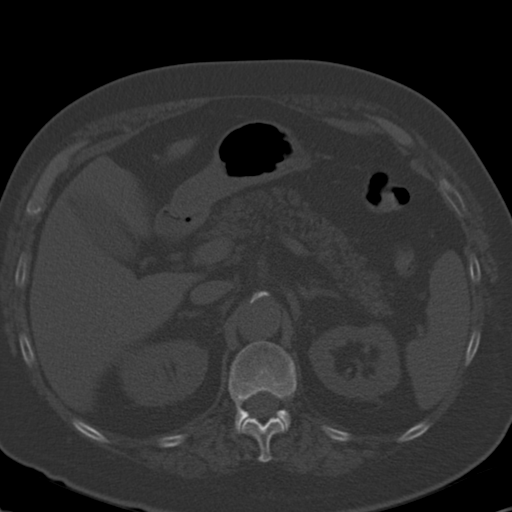
[im 8/62  soft-tissue]
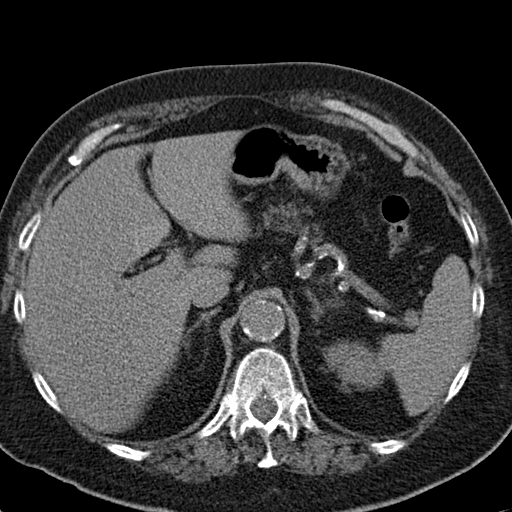
[im 12/62  soft-tissue]
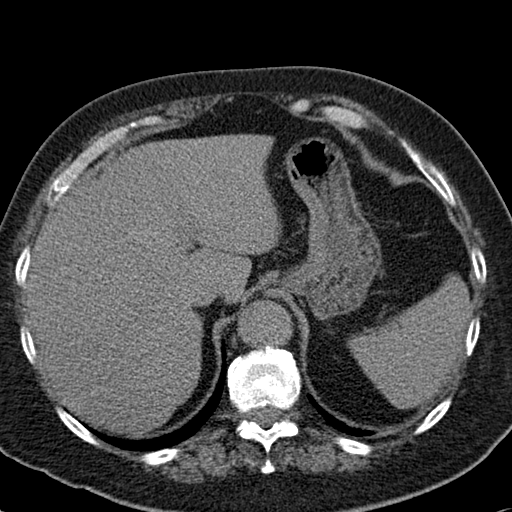
[im 16/62  soft-tissue]
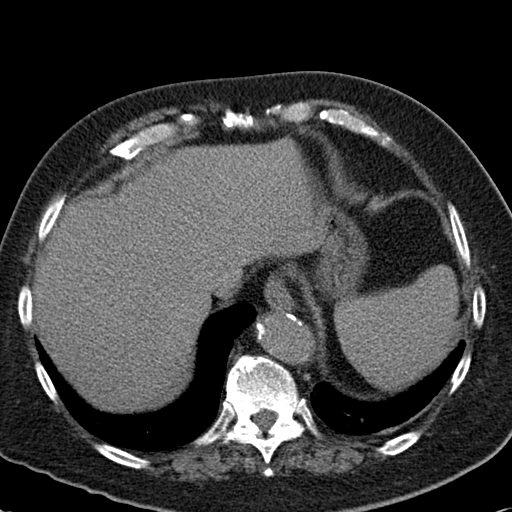
[im 20/62  soft-tissue]
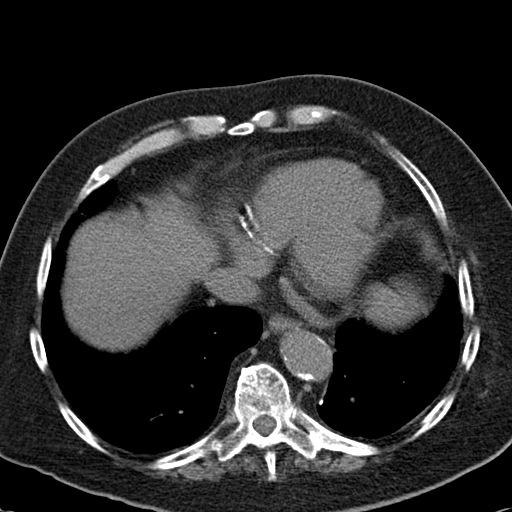
[im 24/62  soft-tissue]
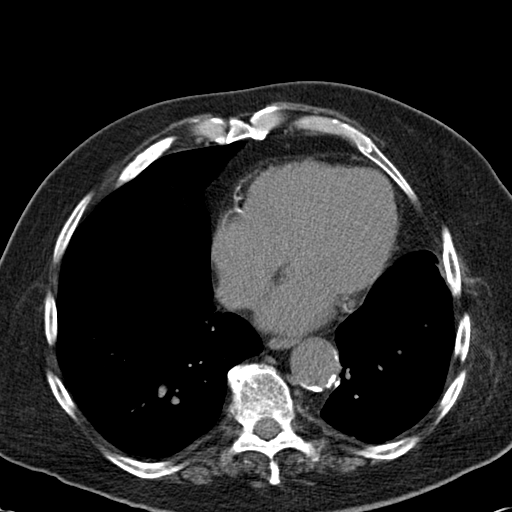
[im 28/62  soft-tissue]
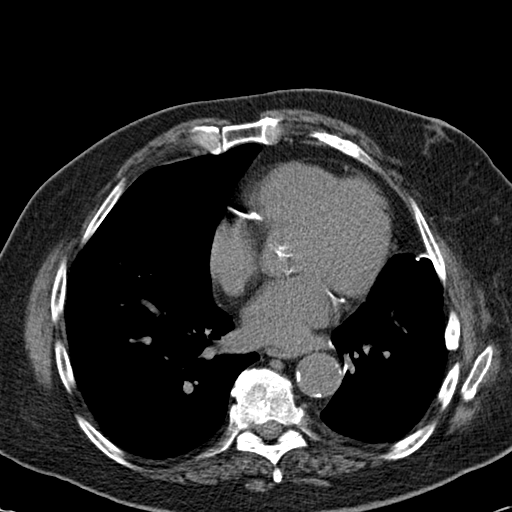
[im 34/62  soft-tissue]
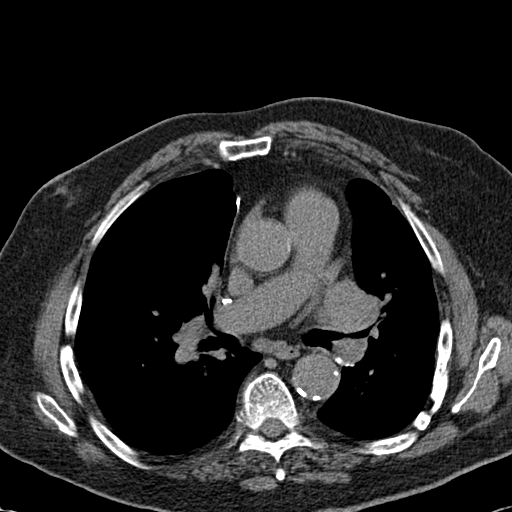
[im 38/62  soft-tissue]
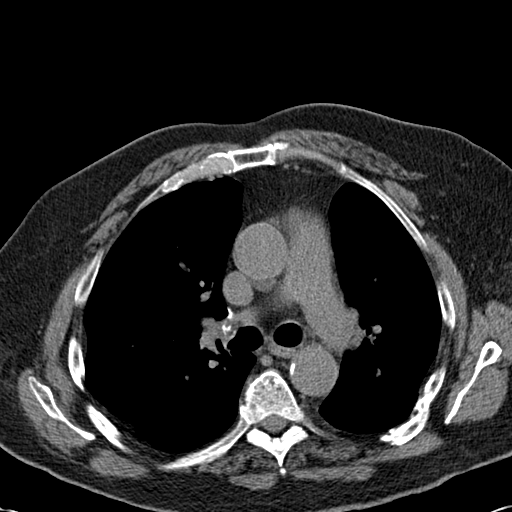
[im 38/62  bone]
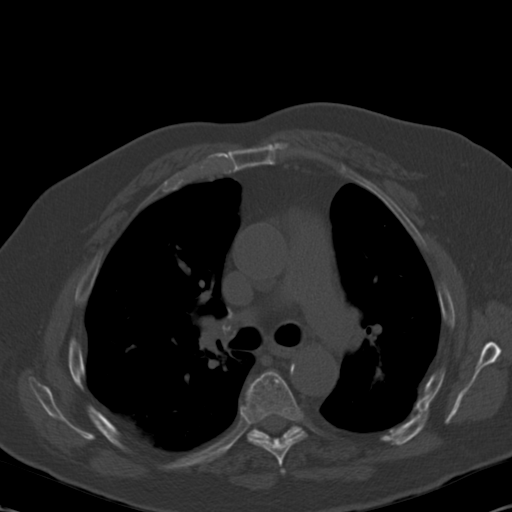
[im 42/62  soft-tissue]
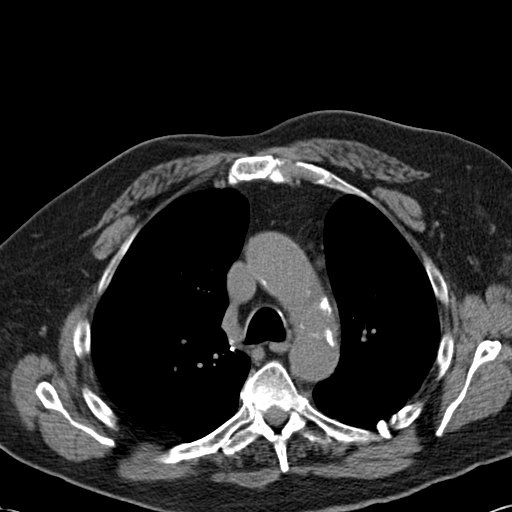
[im 46/62  soft-tissue]
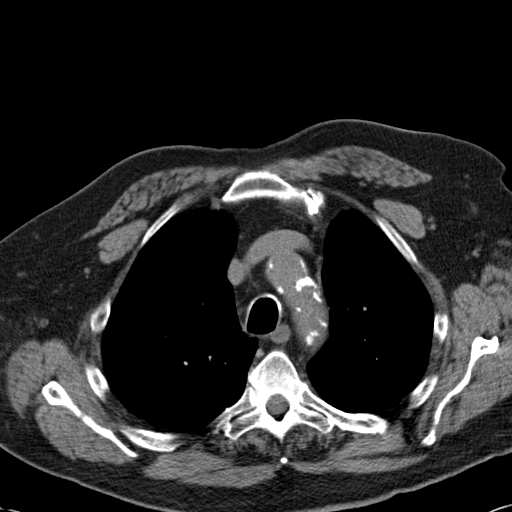
[im 50/62  soft-tissue]
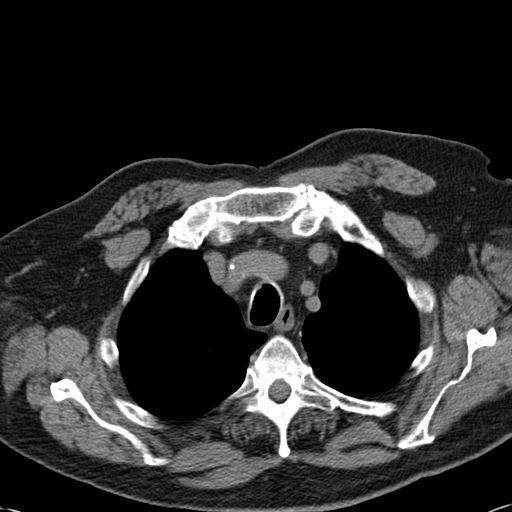
[im 54/62  soft-tissue]
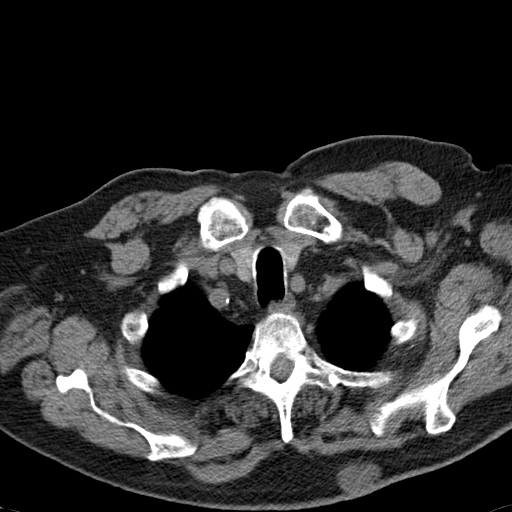
[im 58/62  soft-tissue]
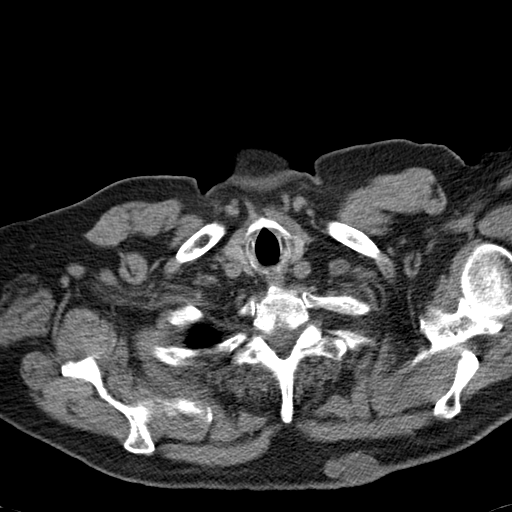

[Series 5: routine chest wo cor · coronal · 0.64mm/px · 3 of 149 slices shown]
[im 50/149  soft-tissue]
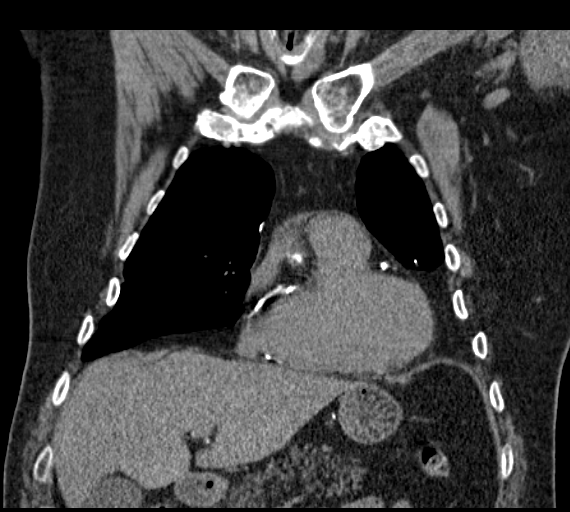
[im 66/149  soft-tissue]
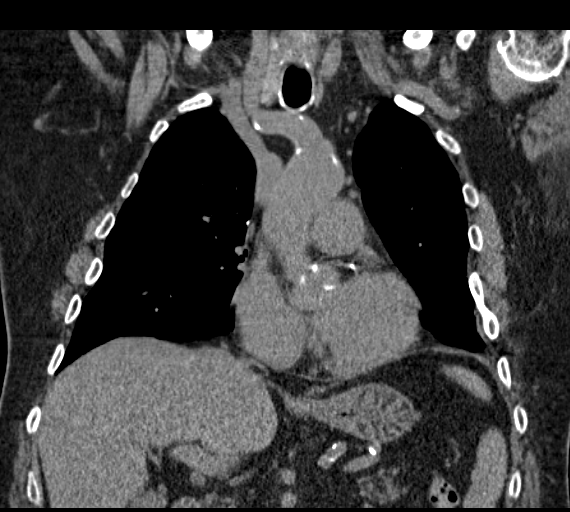
[im 83/149  soft-tissue]
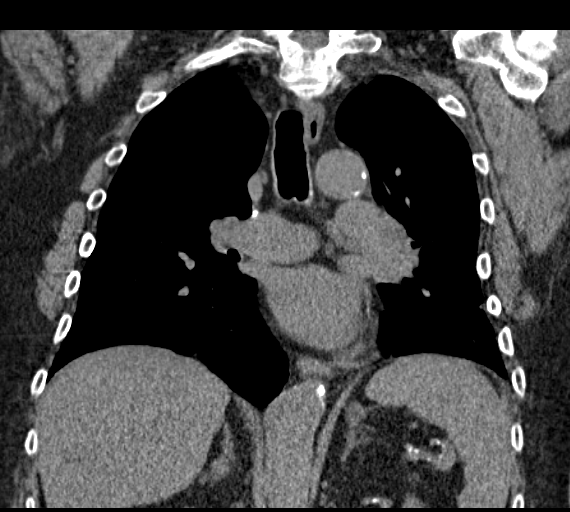

[17 of 46 positions shown; findings below may reference images not displayed]

FINDINGS: Cardiovascular: Coronary artery calcification and aortic
atherosclerotic calcification. No pericardial fluid

Mediastinum/Nodes: No axillary supraclavicular lymphadenopathy. LEFT
AP window / LEFT hilar lymph node is increased in size measuring 31
x 33 mm compared to 27 by 25 mm. Adjacent perihilar lymph node
measuring 19 mm on image 29, series 2

Lungs/Pleura: Lobular pleural thickening in the posterior superior
RIGHT lower lobe measures 52 by 18 mm compared to 45 by 15 mm.

There is volume loss in the LEFT hemi thorax postsurgical change
along the LEFT chest wall. No discrete pulmonary parenchymal
nodularity.

Upper Abdomen: Limited view of the liver, kidneys, pancreas are
unremarkable. Normal adrenal glands.

Musculoskeletal: No aggressive osseous lesion.
IMPRESSION: 1. Evidence of lung cancer recurrence with enlarged LEFT mediastinal
/ hilar lymph nodes.
2. Nodular pleural parenchymal thickening in the superior segment
RIGHT lower lobe is similar to comparison exams but measure slightly
larger. Consider FDG PET CT scan for restaging.

## 2018-03-02 IMAGING — CR DG CHEST 2V
2 series · 2 of 2 positions shown · non-contrast
Comparison: 01/03/2015; interval PET-CT 07/24/2016, CT chest
07/18/2016

CLINICAL DATA: Cough for 2 weeks following bronchoscopy, shortness
of breath, LEFT-sided chest pain, history metastatic lung cancer,
coronary disease post MI

EXAM:
CHEST  2 VIEW

[chest pa]
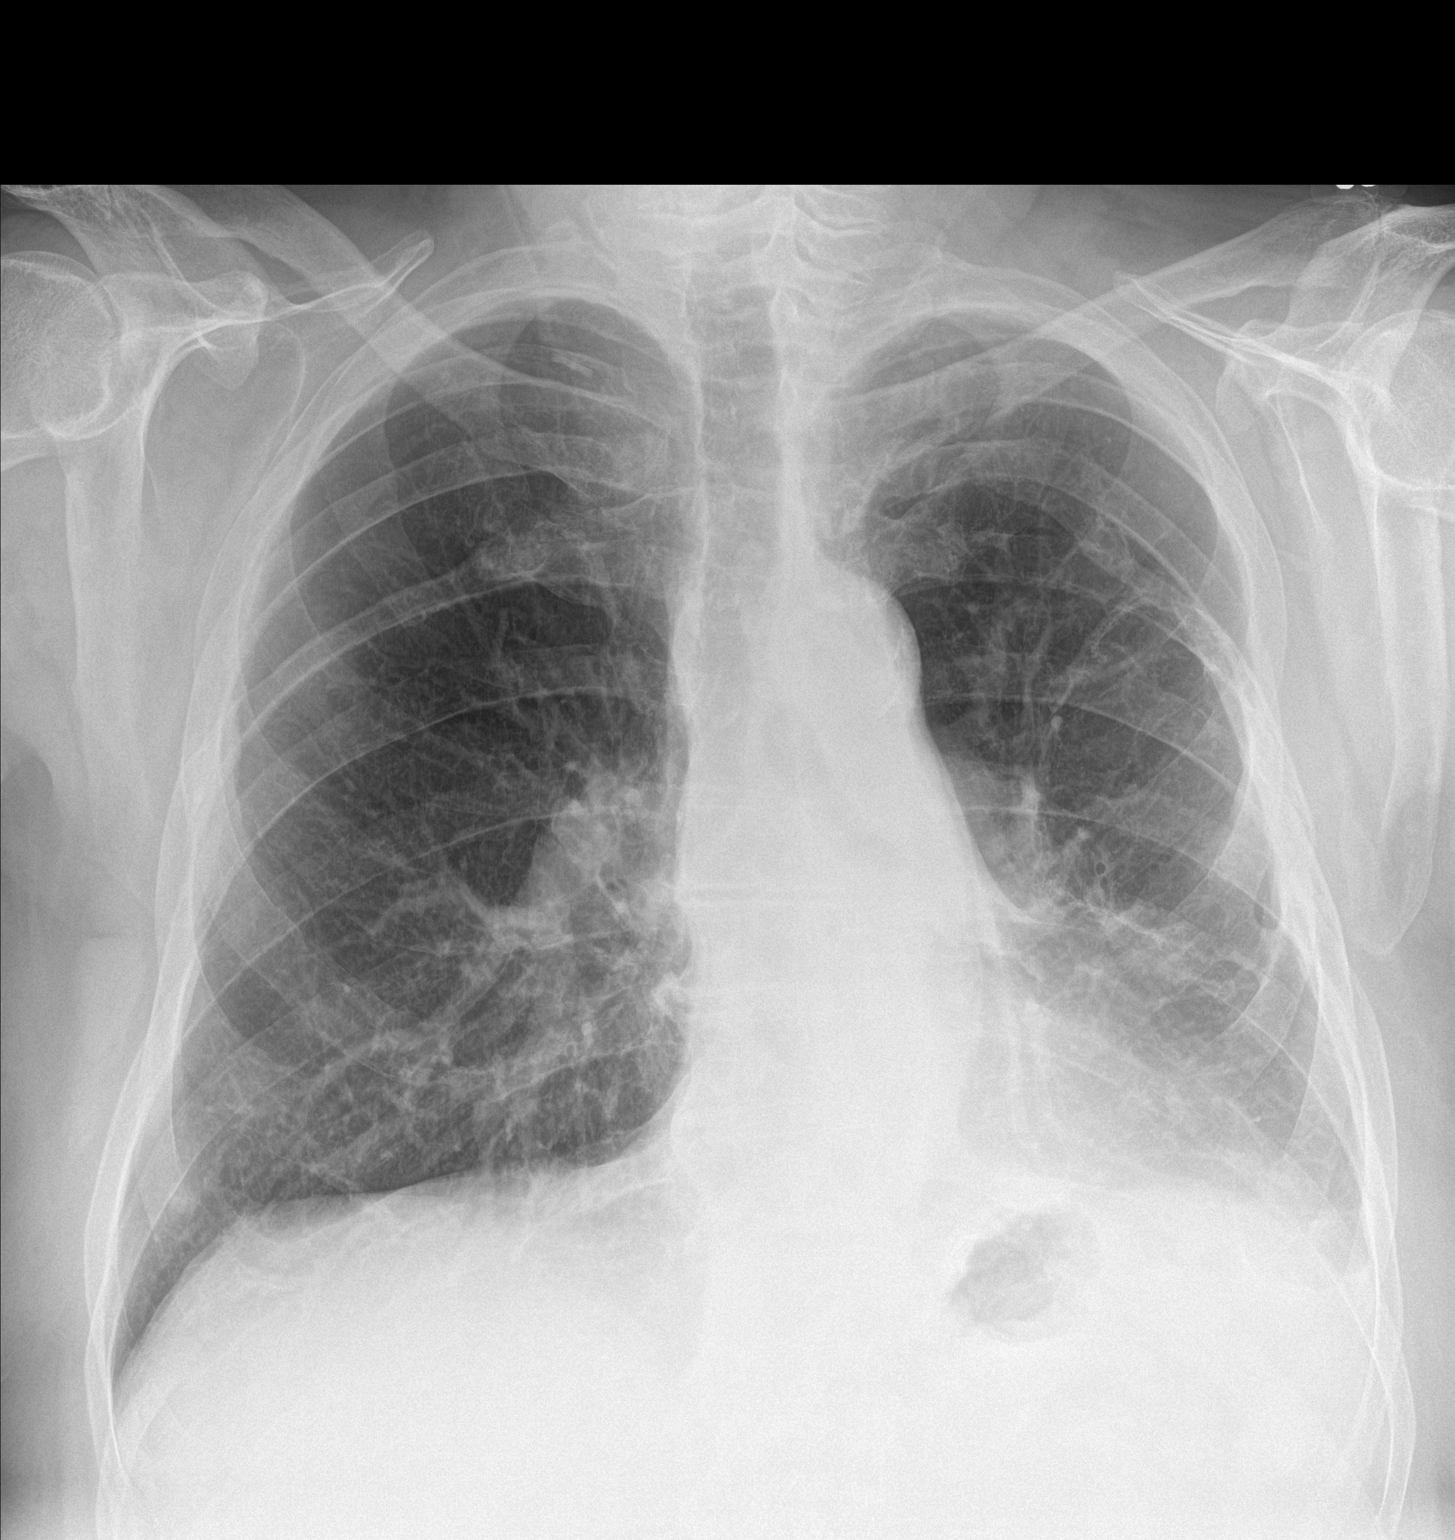

[chest lat]
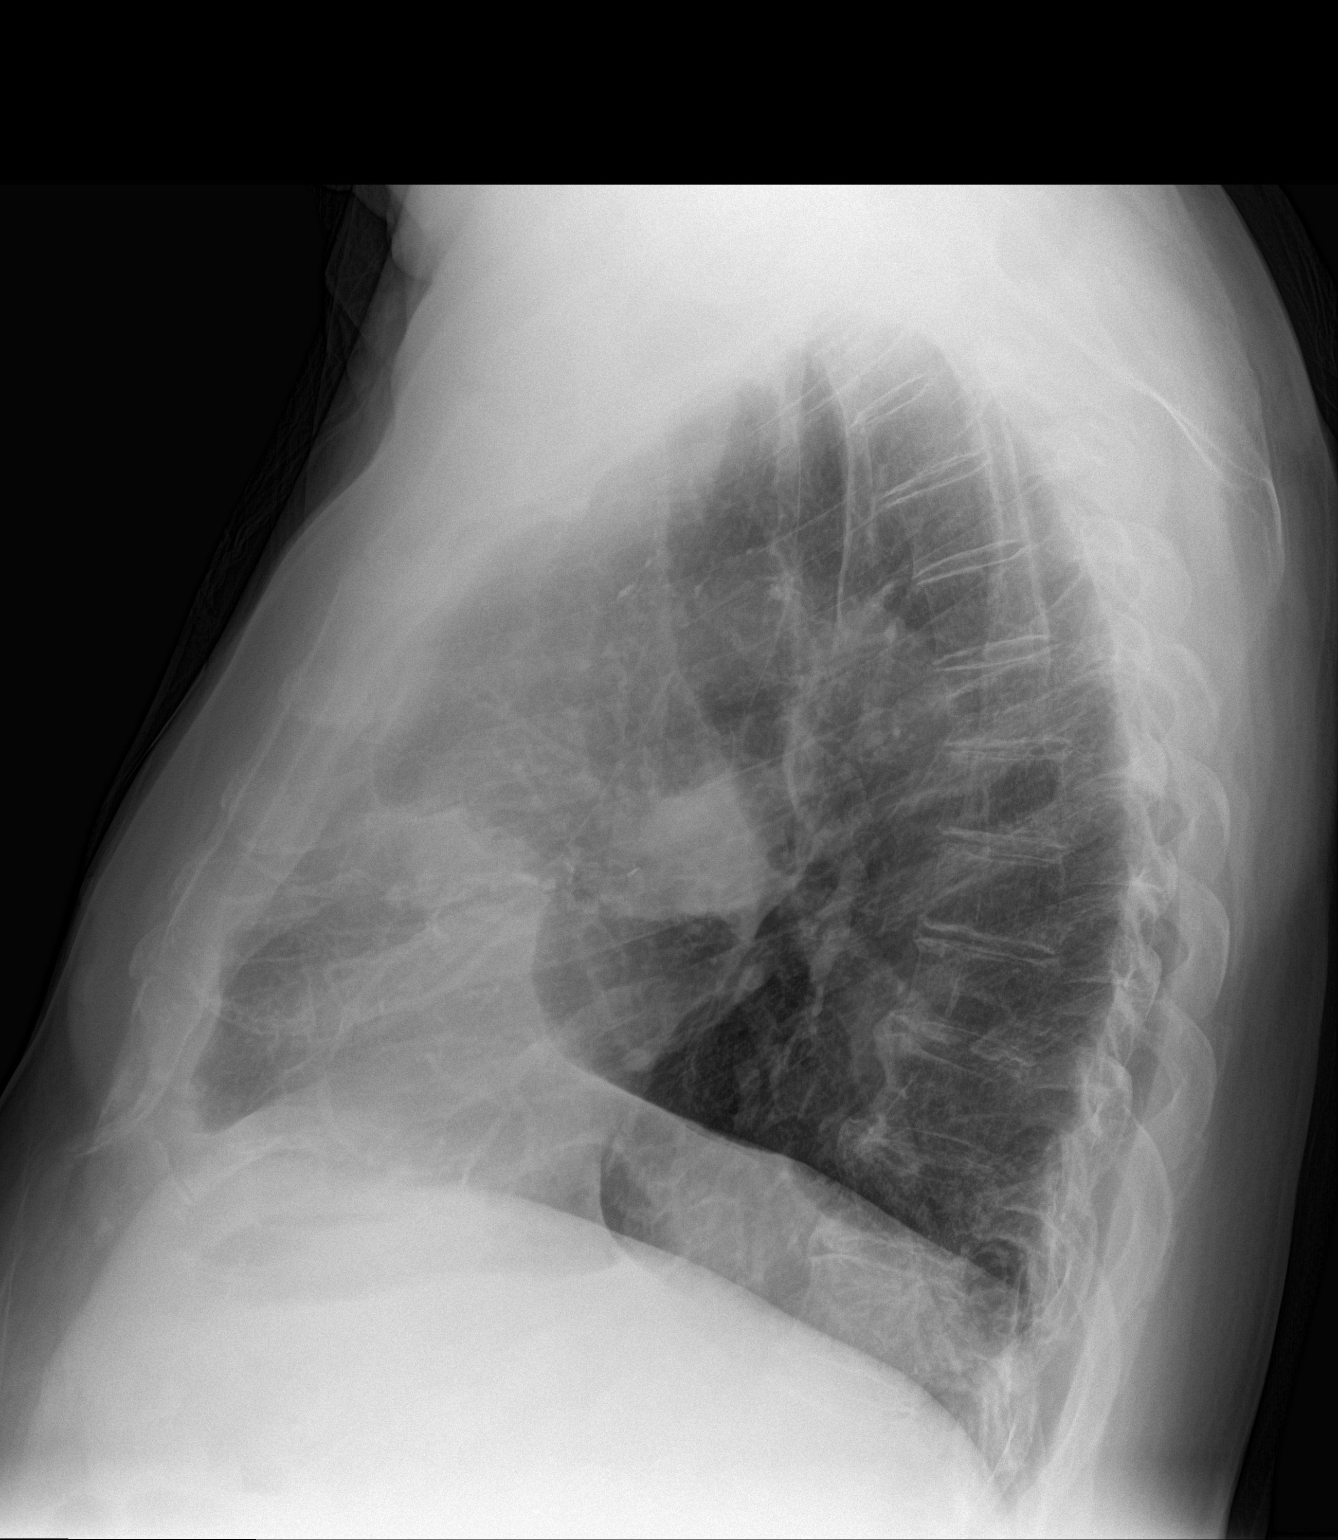

[2 of 2 positions shown; findings below may reference images not displayed]

FINDINGS: Normal heart size, mediastinal contours and pulmonary vascularity.

Calcified and tortuous thoracic aorta.

Postsurgical deformities of the lateral LEFT chest wall.

Emphysematous and bronchitic changes consistent with COPD.

Scarring at LEFT base anteriorly.

No definite acute infiltrate, pleural effusion, or pneumothorax.

Questionable nodular density versus nipple shadow at lateral RIGHT
lung base.

Bones demineralized.
IMPRESSION: Postsurgical changes and scarring in LEFT chest.

Underlying COPD changes.

No definite acute abnormalities.

Question nodular density versus nipple shadow at lateral RIGHT lung
base, without nodule identified at this site on prior PET-CT.

This could be assessed by a repeat PA chest radiograph with nipple
markers or assessed at time of follow-up CT chest.

Aortic atherosclerosis.

## 2018-05-08 ENCOUNTER — Encounter (INDEPENDENT_AMBULATORY_CARE_PROVIDER_SITE_OTHER): Payer: Medicare Other

## 2018-05-08 ENCOUNTER — Ambulatory Visit (INDEPENDENT_AMBULATORY_CARE_PROVIDER_SITE_OTHER): Payer: Medicare Other | Admitting: Vascular Surgery

## 2018-05-08 ENCOUNTER — Other Ambulatory Visit (INDEPENDENT_AMBULATORY_CARE_PROVIDER_SITE_OTHER): Payer: Medicare Other

## 2018-12-10 IMAGING — CR DG CHEST 2V
2 series · 2 of 2 positions shown · non-contrast
Comparison: Chest CT 03/05/2017

CLINICAL DATA: Dry cough since September 2016. History of bilateral
lung cancer.

EXAM:
CHEST  2 VIEW

[chest pa]
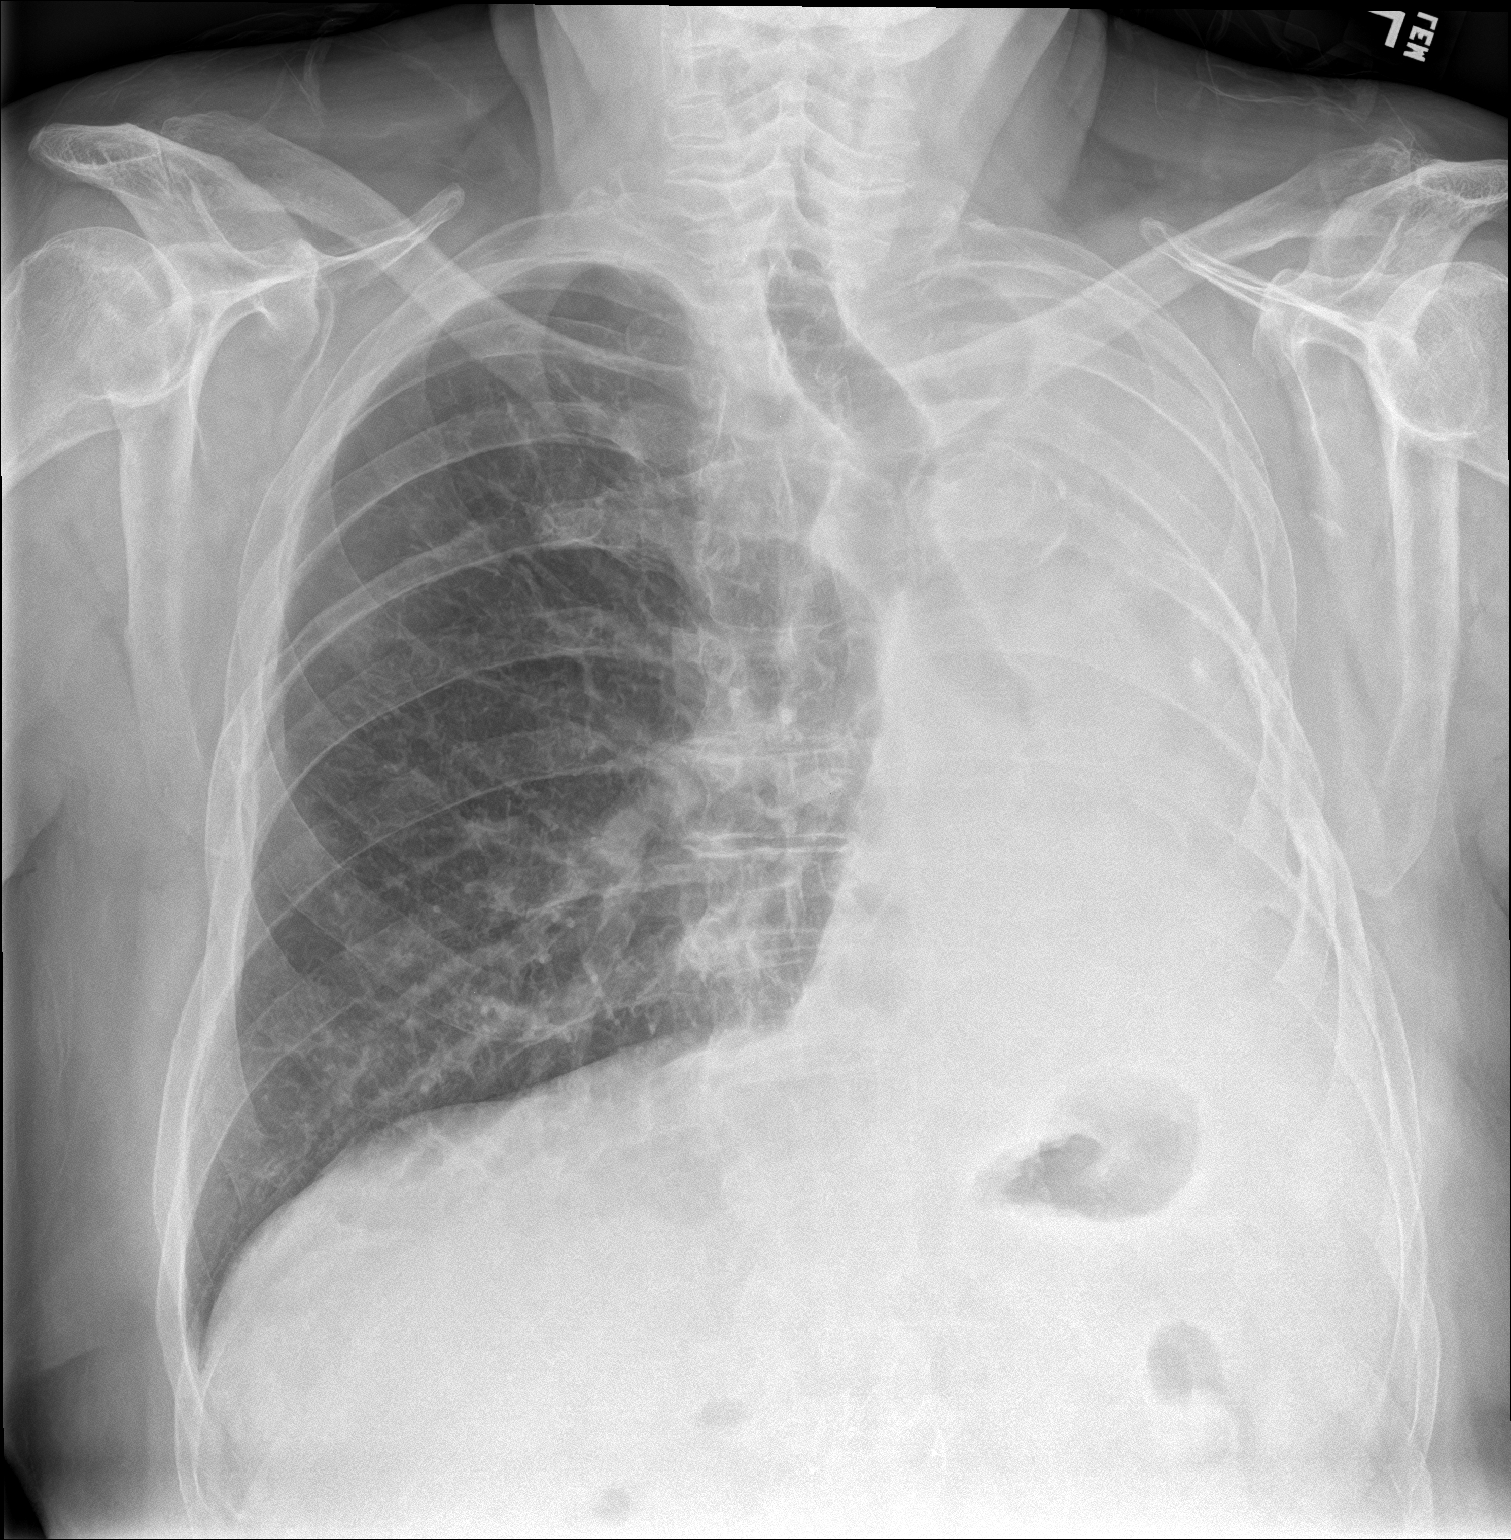

[chest lat]
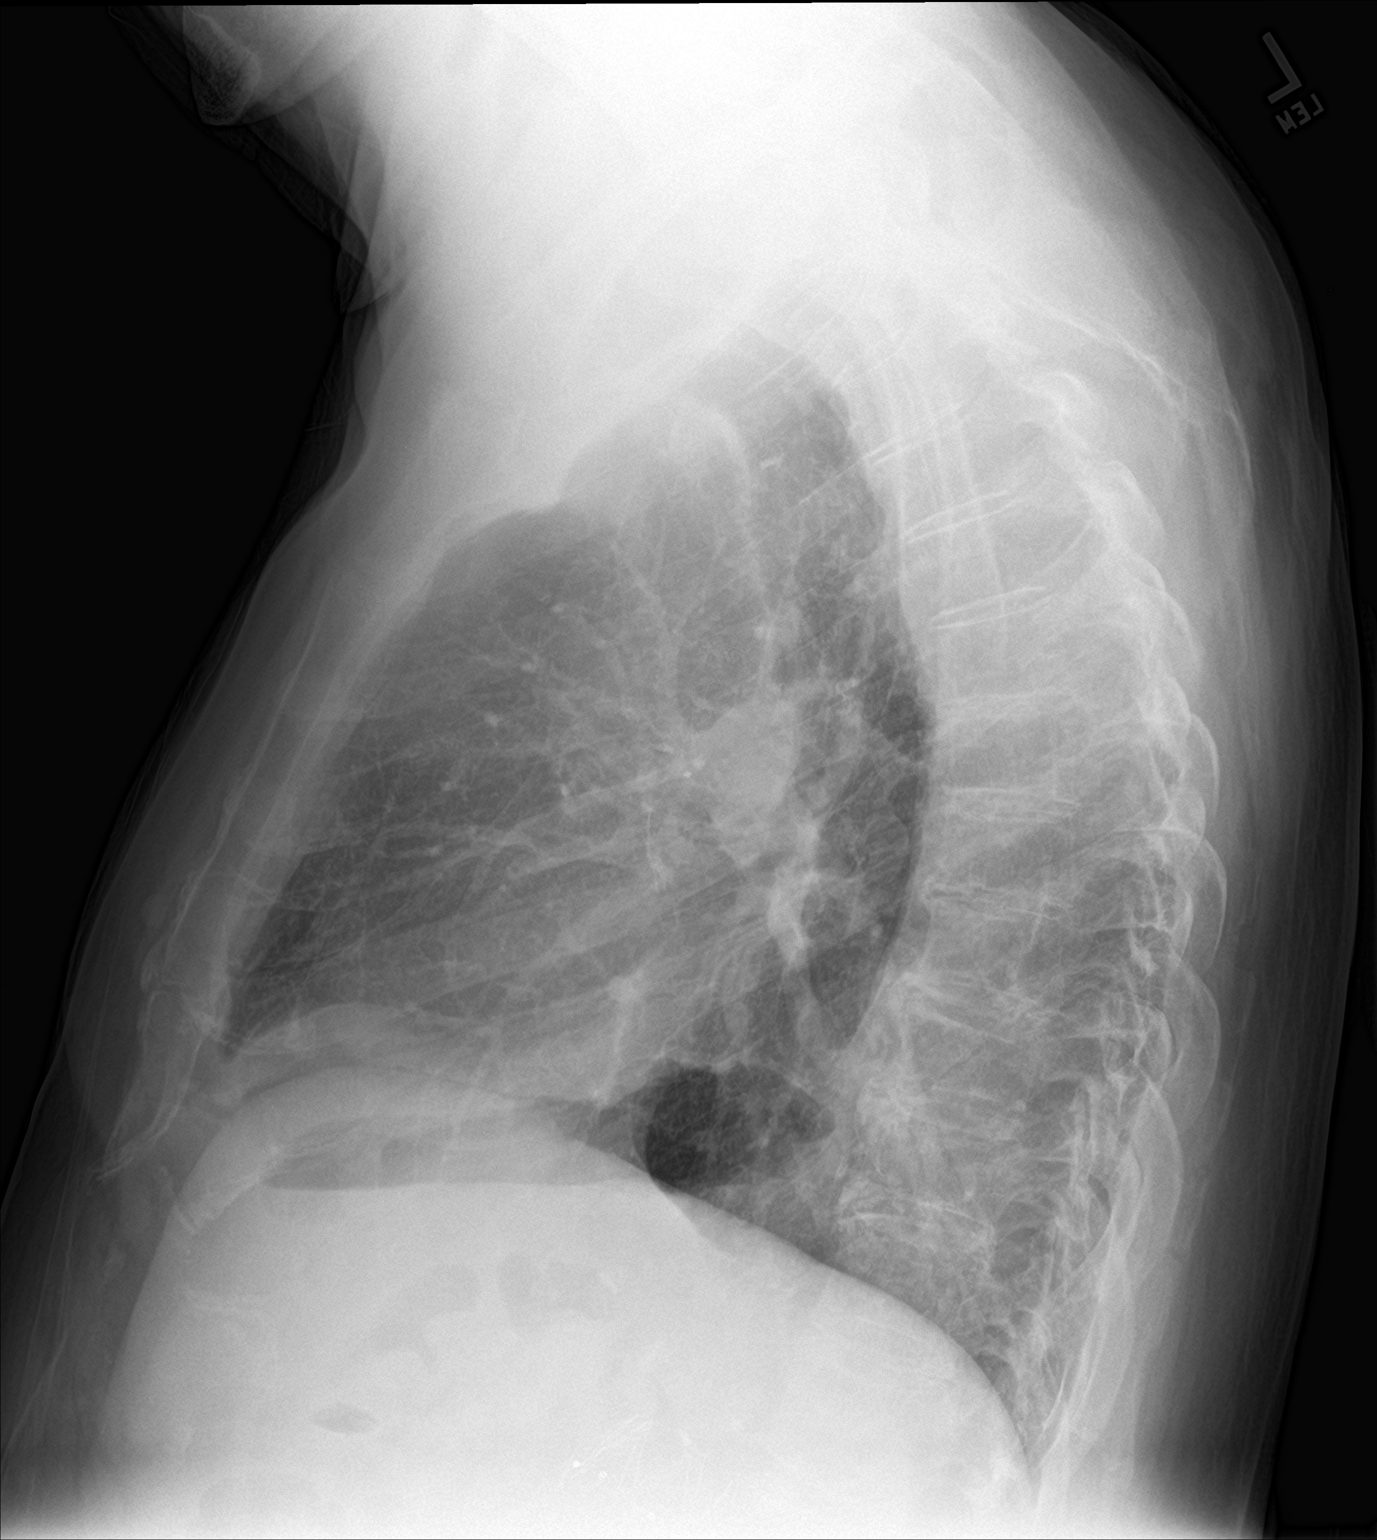

[2 of 2 positions shown; findings below may reference images not displayed]

FINDINGS: There is complete opacification of the left hemithorax which is new
since the prior recent chest CT from [REDACTED]. There is loss of volume
in the left hemithorax with hyperinflation of the right lung.
Possible density in the left hilum could be an enlarging left hilar
[place] causing obstruction of the left mainstem bronchus. Recommend
followup chest CT with contrast for further evaluation. No right
lung lesions. No obvious pleural effusion. Stable left rib changes.
IMPRESSION: Complete opacification of the left hemithorax with a bronchial cut
off sign involving the left mainstem bronchus likely due to
enlarging left hilar mass. Recommend chest CT with contrast for
further evaluation.
